# Patient Record
Sex: Male | Born: 1965
Health system: Southern US, Community
[De-identification: ages and names within clinical notes are randomized; demographics above are authoritative.]

## PROBLEM LIST (undated history)

## (undated) DIAGNOSIS — I10 Essential (primary) hypertension: Secondary | ICD-10-CM

## (undated) DIAGNOSIS — N189 Chronic kidney disease, unspecified: Secondary | ICD-10-CM

## (undated) DIAGNOSIS — E785 Hyperlipidemia, unspecified: Secondary | ICD-10-CM

## (undated) DIAGNOSIS — Z992 Dependence on renal dialysis: Secondary | ICD-10-CM

## (undated) DIAGNOSIS — D649 Anemia, unspecified: Secondary | ICD-10-CM

## (undated) DIAGNOSIS — F419 Anxiety disorder, unspecified: Secondary | ICD-10-CM

## (undated) DIAGNOSIS — M2550 Pain in unspecified joint: Secondary | ICD-10-CM

## (undated) HISTORY — PX: KIDNEY TRANSPLANT: SHX239

## (undated) HISTORY — DX: Chronic kidney disease, unspecified: N18.9

## (undated) HISTORY — DX: Pain in unspecified joint: M25.50

## (undated) HISTORY — DX: Hyperlipidemia, unspecified: E78.5

## (undated) HISTORY — PX: HAND SURGERY: SHX662

## (undated) HISTORY — PX: BACK SURGERY: SHX140

## (undated) HISTORY — DX: Anemia, unspecified: D64.9

## (undated) HISTORY — DX: Anxiety disorder, unspecified: F41.9

## (undated) HISTORY — DX: Essential (primary) hypertension: I10

## (undated) HISTORY — PX: OPEN ANTERIOR SHOULDER RECONSTRUCTION: SHX2100

---

## 2002-07-02 ENCOUNTER — Encounter: Payer: Self-pay | Admitting: Emergency Medicine

## 2002-07-02 ENCOUNTER — Emergency Department (HOSPITAL_COMMUNITY): Admission: EM | Admit: 2002-07-02 | Discharge: 2002-07-02 | Payer: Self-pay | Admitting: Emergency Medicine

## 2002-07-03 ENCOUNTER — Inpatient Hospital Stay (HOSPITAL_COMMUNITY): Admission: EM | Admit: 2002-07-03 | Discharge: 2002-07-04 | Payer: Self-pay | Admitting: Emergency Medicine

## 2002-07-03 ENCOUNTER — Encounter: Payer: Self-pay | Admitting: Urology

## 2002-10-19 ENCOUNTER — Other Ambulatory Visit (HOSPITAL_COMMUNITY): Admission: RE | Admit: 2002-10-19 | Discharge: 2002-11-26 | Payer: Self-pay | Admitting: *Deleted

## 2002-12-04 ENCOUNTER — Other Ambulatory Visit (HOSPITAL_COMMUNITY): Admission: RE | Admit: 2002-12-04 | Discharge: 2002-12-27 | Payer: Self-pay | Admitting: *Deleted

## 2006-03-17 ENCOUNTER — Emergency Department (HOSPITAL_COMMUNITY): Admission: EM | Admit: 2006-03-17 | Discharge: 2006-03-17 | Payer: Self-pay | Admitting: Emergency Medicine

## 2006-03-29 ENCOUNTER — Encounter: Admission: RE | Admit: 2006-03-29 | Discharge: 2006-03-29 | Payer: Self-pay | Admitting: Orthopedic Surgery

## 2006-04-04 ENCOUNTER — Ambulatory Visit (HOSPITAL_COMMUNITY): Admission: RE | Admit: 2006-04-04 | Discharge: 2006-04-05 | Payer: Self-pay | Admitting: Orthopedic Surgery

## 2006-05-04 ENCOUNTER — Encounter: Admission: RE | Admit: 2006-05-04 | Discharge: 2006-05-04 | Payer: Self-pay | Admitting: Orthopedic Surgery

## 2006-05-25 ENCOUNTER — Encounter: Admission: RE | Admit: 2006-05-25 | Discharge: 2006-05-25 | Payer: Self-pay | Admitting: Orthopedic Surgery

## 2006-07-07 ENCOUNTER — Ambulatory Visit (HOSPITAL_COMMUNITY): Admission: RE | Admit: 2006-07-07 | Discharge: 2006-07-07 | Payer: Self-pay | Admitting: Orthopedic Surgery

## 2007-02-02 IMAGING — CR DG CHEST 2V
2 series · 2 of 2 positions shown · non-contrast
Comparison: None.

CLINICAL DATA: Preadmission for left clavicle fracture.  
 CHEST ? 2 VIEW:

[view not recorded (1 of 2)]
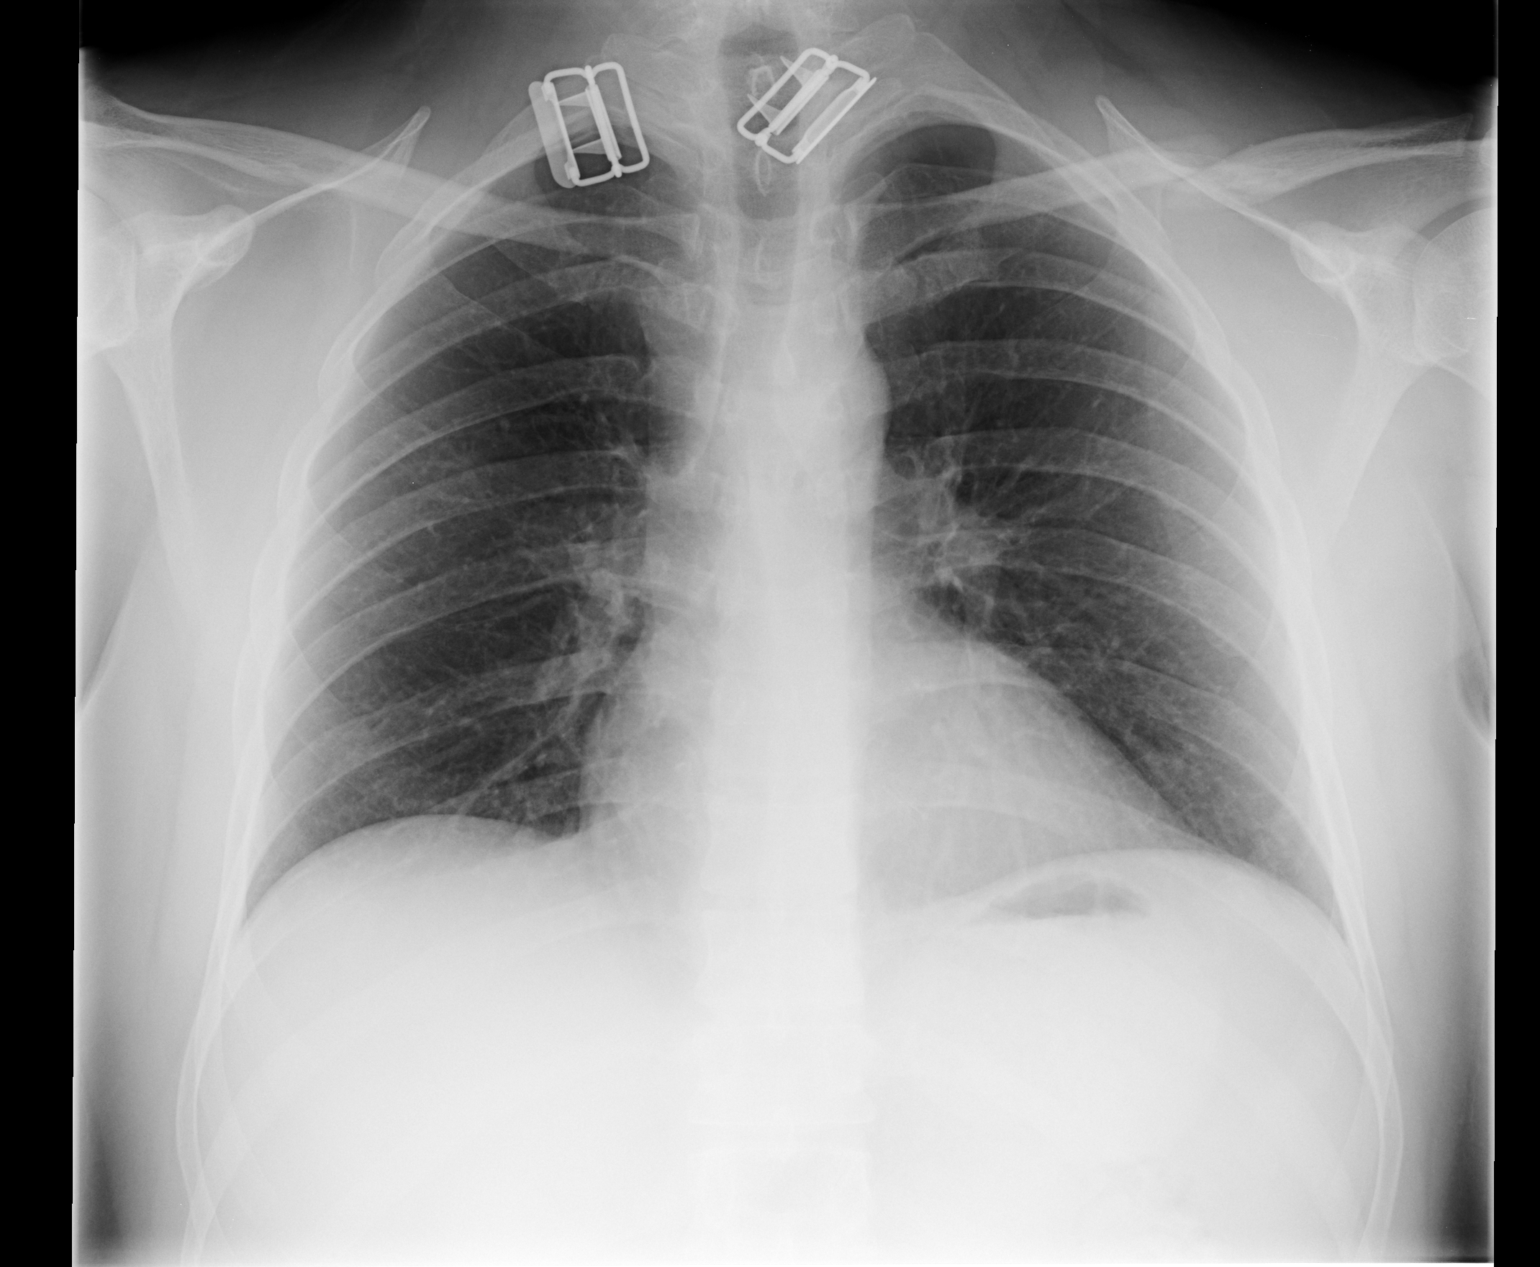

[view not recorded (2 of 2)]
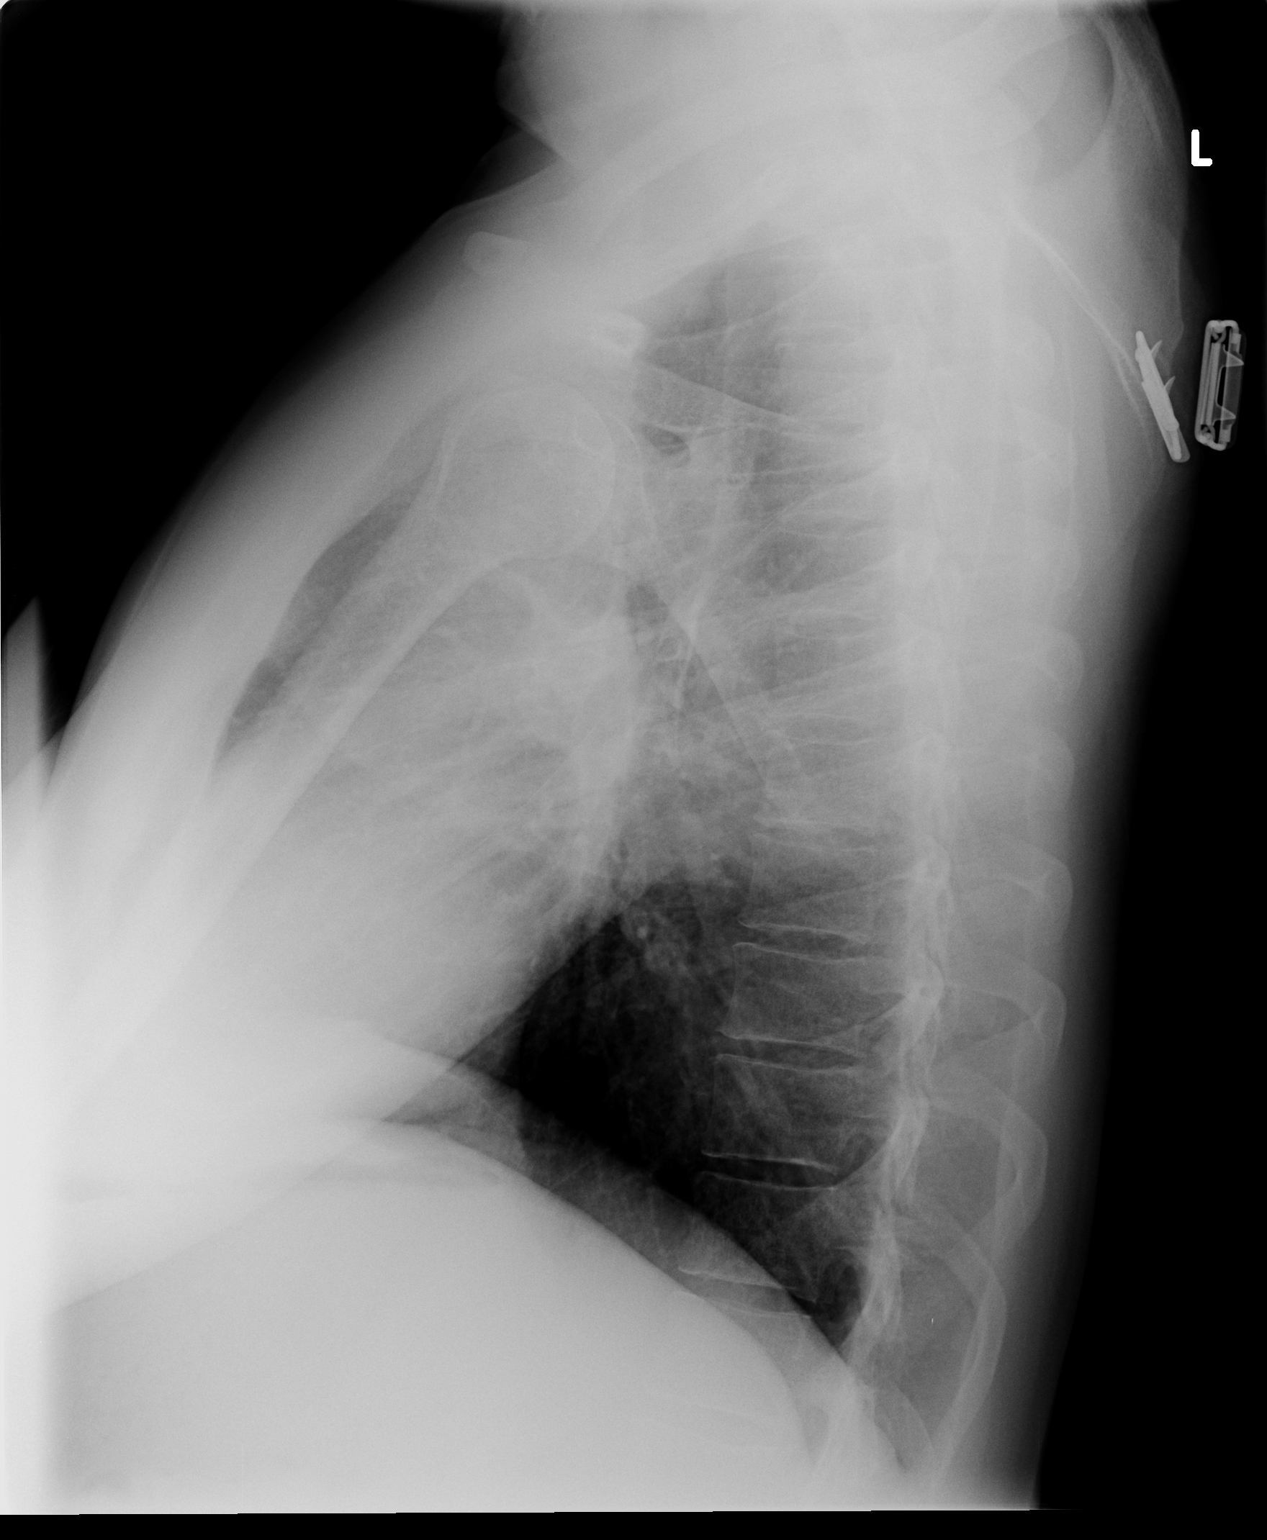

[2 of 2 positions shown; findings below may reference images not displayed]

FINDINGS: Heart size is accentuated by low lung volumes.  Right base subsegmental atelectasis.  Lungs are otherwise clear.  Left clavicle fracture noted.
IMPRESSION: 1.  No acute cardiopulmonary process.
 2.  Left clavicle fracture.

## 2007-02-05 IMAGING — RF DG CLAVICLE*L*
1 series · 4 of 4 positions shown · non-contrast
Comparison: none

CLINICAL DATA: Left clavicle fracture.  ORIF.
 LEFT CLAVICLE ? 4 VIEW:

[Series 1: run · 4 of 4 slices shown]
[im 1/4]
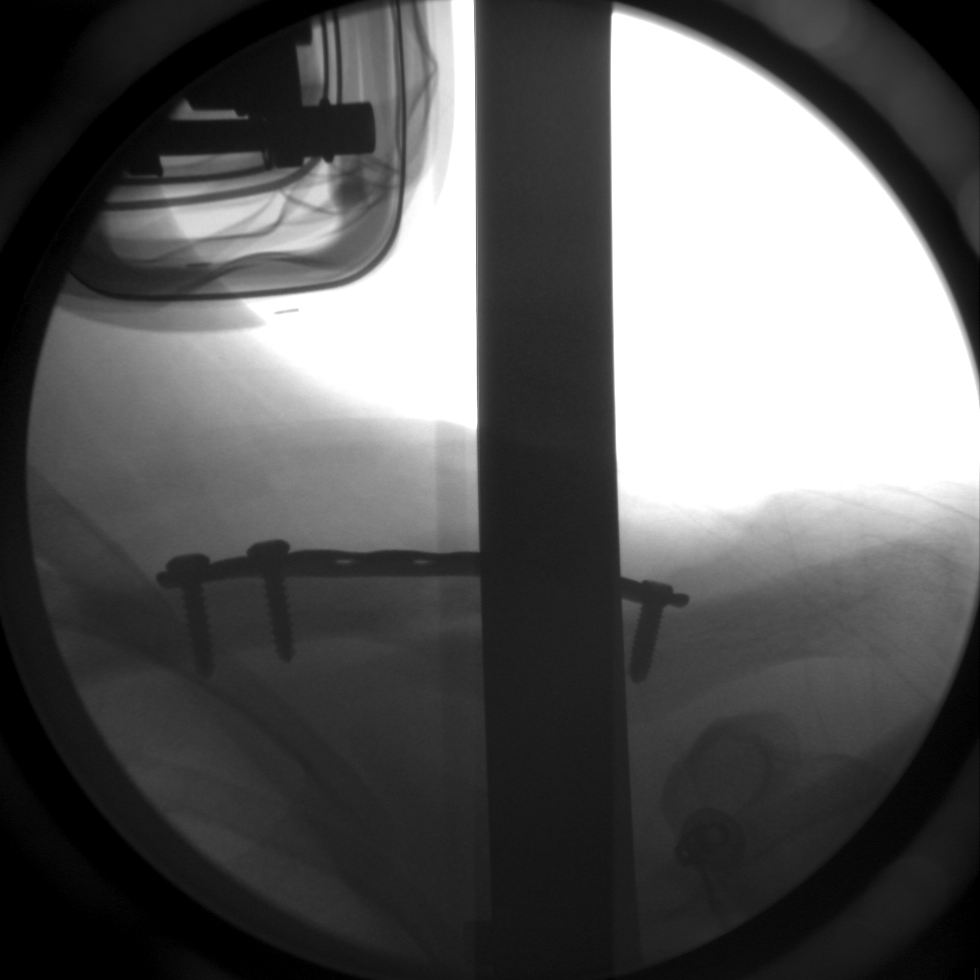
[im 2/4]
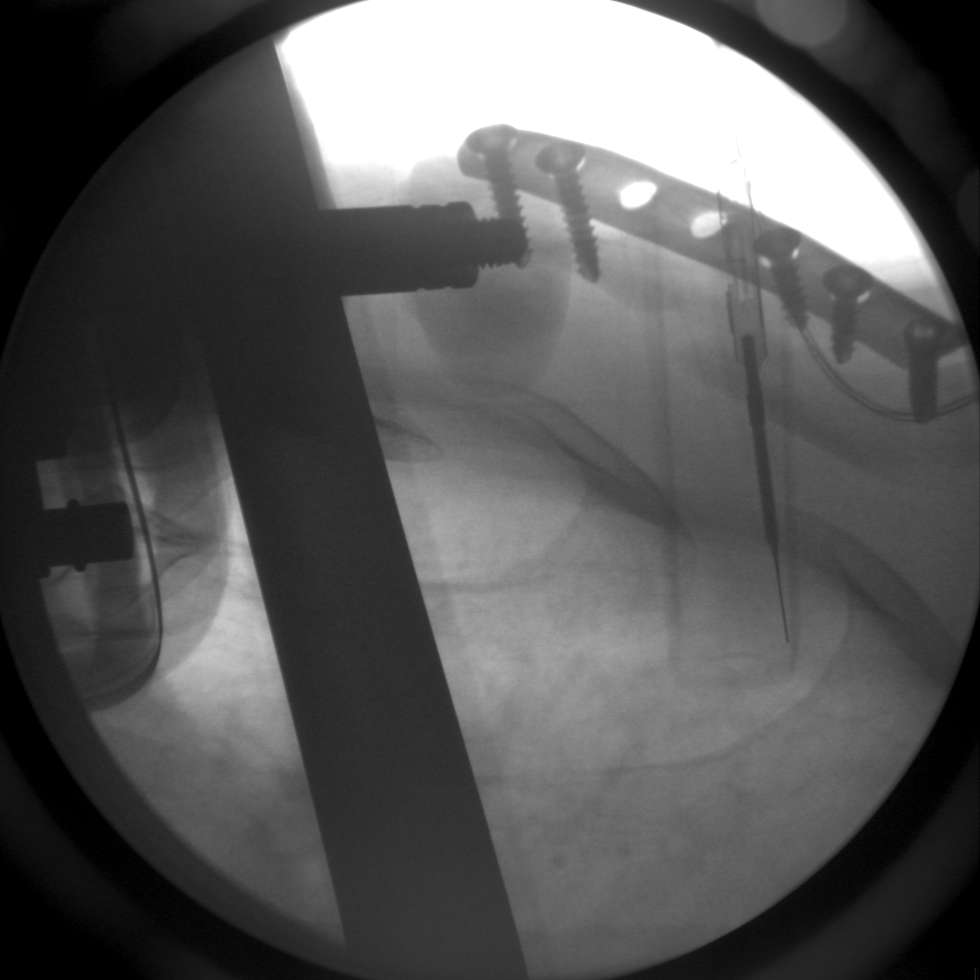
[im 3/4]
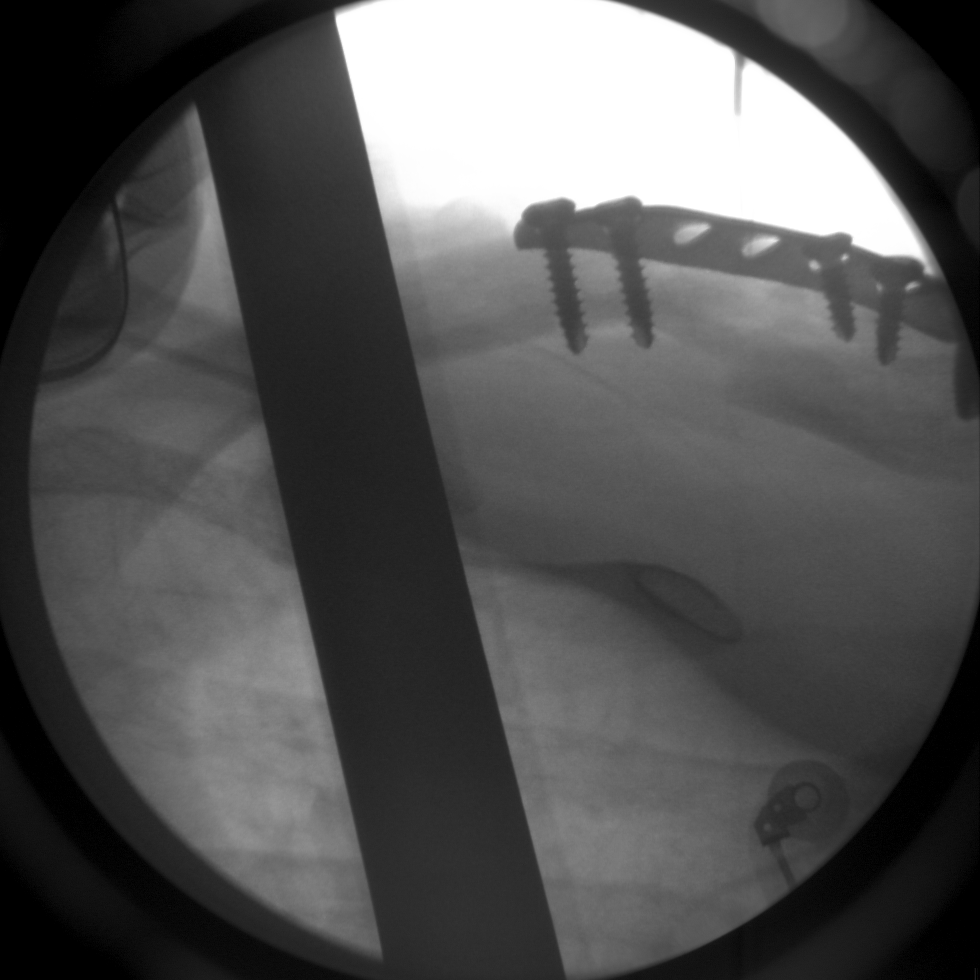
[im 4/4]
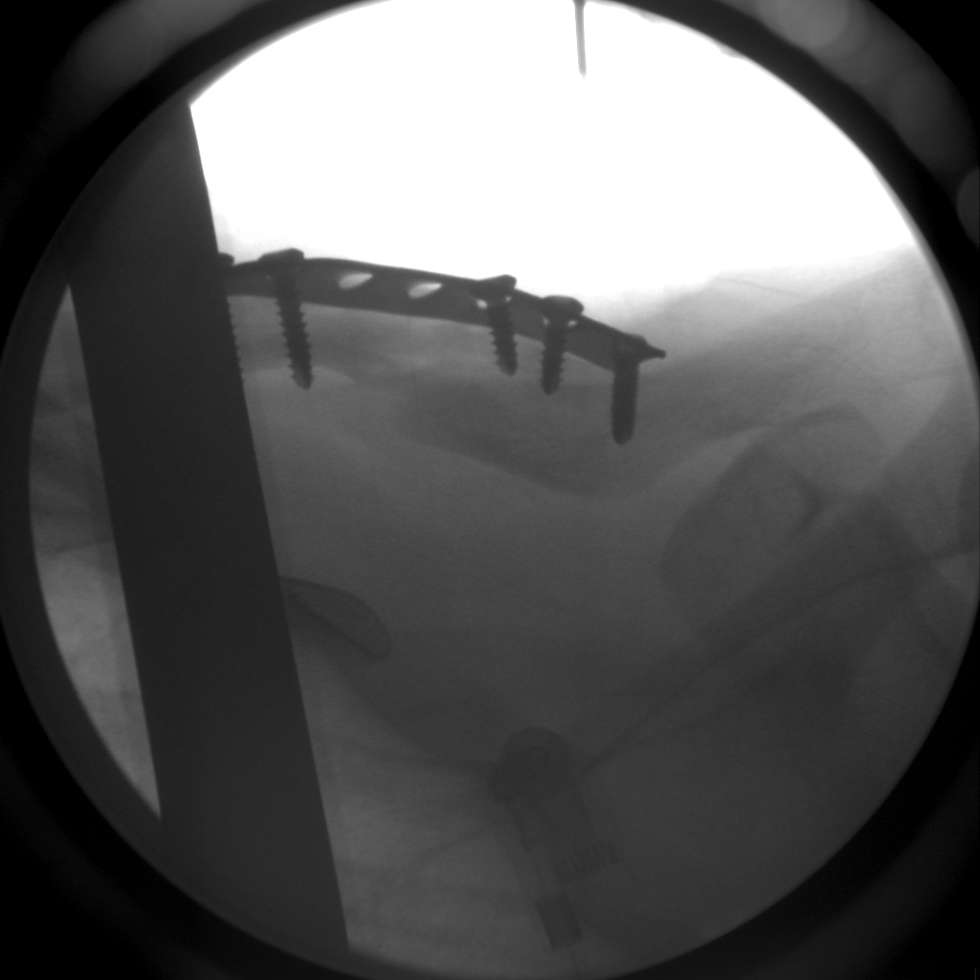

[4 of 4 positions shown; findings below may reference images not displayed]

FINDINGS: Four C-arm guided fluoroscopic images were acquired for assistance with ORIF of displaced left clavicular fracture.  Plate and screw fixation hardware has been placed.
IMPRESSION: Status post ORIF left clavicle fracture.

## 2007-04-28 ENCOUNTER — Emergency Department (HOSPITAL_COMMUNITY): Admission: EM | Admit: 2007-04-28 | Discharge: 2007-04-28 | Payer: Self-pay | Admitting: Emergency Medicine

## 2007-05-03 ENCOUNTER — Emergency Department (HOSPITAL_COMMUNITY): Admission: EM | Admit: 2007-05-03 | Discharge: 2007-05-03 | Payer: Self-pay | Admitting: Family Medicine

## 2009-12-05 ENCOUNTER — Inpatient Hospital Stay (HOSPITAL_COMMUNITY): Admission: EM | Admit: 2009-12-05 | Discharge: 2009-12-17 | Payer: Self-pay | Admitting: Emergency Medicine

## 2009-12-05 ENCOUNTER — Ambulatory Visit: Payer: Self-pay | Admitting: Cardiovascular Disease

## 2009-12-06 ENCOUNTER — Encounter (INDEPENDENT_AMBULATORY_CARE_PROVIDER_SITE_OTHER): Payer: Self-pay | Admitting: Internal Medicine

## 2009-12-08 ENCOUNTER — Encounter (INDEPENDENT_AMBULATORY_CARE_PROVIDER_SITE_OTHER): Payer: Self-pay | Admitting: Internal Medicine

## 2009-12-13 ENCOUNTER — Other Ambulatory Visit: Payer: Self-pay | Admitting: Vascular Surgery

## 2009-12-13 ENCOUNTER — Ambulatory Visit: Payer: Self-pay | Admitting: Vascular Surgery

## 2010-01-06 ENCOUNTER — Ambulatory Visit: Payer: Self-pay | Admitting: Vascular Surgery

## 2010-01-15 ENCOUNTER — Ambulatory Visit (HOSPITAL_COMMUNITY): Admission: RE | Admit: 2010-01-15 | Discharge: 2010-01-15 | Payer: Self-pay | Admitting: Vascular Surgery

## 2010-01-15 ENCOUNTER — Ambulatory Visit: Payer: Self-pay | Admitting: Vascular Surgery

## 2010-01-27 ENCOUNTER — Ambulatory Visit: Payer: Self-pay | Admitting: Vascular Surgery

## 2010-01-29 ENCOUNTER — Ambulatory Visit (HOSPITAL_COMMUNITY): Admission: RE | Admit: 2010-01-29 | Discharge: 2010-01-29 | Payer: Self-pay | Admitting: Vascular Surgery

## 2010-03-10 ENCOUNTER — Ambulatory Visit: Payer: Self-pay | Admitting: Vascular Surgery

## 2010-05-21 ENCOUNTER — Ambulatory Visit (HOSPITAL_COMMUNITY): Admission: RE | Admit: 2010-05-21 | Discharge: 2010-05-21 | Payer: Self-pay | Admitting: Vascular Surgery

## 2010-05-21 ENCOUNTER — Ambulatory Visit: Payer: Self-pay | Admitting: Vascular Surgery

## 2010-10-25 ENCOUNTER — Encounter: Payer: Self-pay | Admitting: Nephrology

## 2010-11-24 ENCOUNTER — Other Ambulatory Visit (HOSPITAL_BASED_OUTPATIENT_CLINIC_OR_DEPARTMENT_OTHER): Payer: Self-pay | Admitting: Family Medicine

## 2010-11-24 DIAGNOSIS — M545 Low back pain, unspecified: Secondary | ICD-10-CM

## 2010-11-25 ENCOUNTER — Other Ambulatory Visit (HOSPITAL_BASED_OUTPATIENT_CLINIC_OR_DEPARTMENT_OTHER): Payer: Self-pay

## 2010-11-28 ENCOUNTER — Other Ambulatory Visit (HOSPITAL_BASED_OUTPATIENT_CLINIC_OR_DEPARTMENT_OTHER): Payer: Self-pay

## 2010-12-03 ENCOUNTER — Ambulatory Visit (INDEPENDENT_AMBULATORY_CARE_PROVIDER_SITE_OTHER): Payer: Medicare Other

## 2010-12-03 DIAGNOSIS — N186 End stage renal disease: Secondary | ICD-10-CM

## 2010-12-03 NOTE — H&P (Signed)
HISTORY AND PHYSICAL EXAMINATION  December 03, 2010  Re:  Billy [name], Billy [name]               DOB:  1965-11-24  HISTORY OF PRESENT ILLNESS:  The patient is a 45 year old Caucasian male who presents today for evaluation of pseudoaneurysm over his left brachiocephalic AV fistula that was placed in 2011 by Dr. Kellie Simmering.  The patient states that over the past 2 months, he has noticed a significant increase in size of pseudoaneurysm at the distal portion of the fistula, and it is now difficult for him to bend his arm and sometimes is uncomfortable/painful.  The patient states that he first started noticing increase in the size of the fistula probably 6 or 8 months ago, and then it has become significantly larger over the past 2 months.  The patient denies bleeding issues with the fistula, and he denies steal symptoms.  He wished to be evaluated secondary to the increase in size and the discomfort that he notices, particularly when he bends his arm.  PAST MEDICAL HISTORY: 1. End-stage renal disease, dialysis, Monday, Wednesday, Friday. 2. Hypertension. 3. Hyperlipidemia. 4. Shoulder surgery x3 in 2007. 5. Right hand surgery many years ago.  FAMILY HISTORY:  Not available.  The patient is adopted.  SOCIAL HISTORY:  The patient is single with 3 children.  He is on disability.  He denies tobacco use and drinks alcohol on occasion.  Complete review of systems is negative.  Please refer to HPI otherwise.  MEDICATIONS: 1. Lisinopril. 2. Poly Iron. 3. Carvedilol. 4. Pravastatin.  ALLERGIES:  Calcium channel blockers.  PHYSICAL EXAM:  Patient is in no acute distress.  He is resting comfortable on the bed.  HEENT: PERRL.  EOMI.  Lungs: Clear to auscultation.  Cardiovascular: Regular rate and rhythm.  Abdomen is soft with active bowel sounds.  Musculoskeletal: No major deformities or cyanosis.  There is a left brachial cephalic fistula present with a thrill and bruit.  There is  a moderate-sized pseudoaneurysm present from the antecubital space up to the mid upper arm.  There is no evidence of ulceration or skin breakdown in this area.  The patient has a 2+ palpable radial and ulnar pulse.  Motor and sensation are intact. Neuro: No focal weakness or paresthesias.  Skin:  No ulcers or rashes.  ASSESSMENT: 1. End-stage renal disease with hemodialysis on Monday, Wednesday, and     Friday. 2. 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  PLAN:  The patient does not want to have a Diatek catheter in place. This case was discussed with Dr. Oneida Alar, and he recommended that the patient return in approximately 1 month for a follow-up with Dr. Kellie Simmering after having a repeat right upper extremity vein mapping in order to consider placement of a right arm AV fistula, so that this can be maturing and developing when the time comes, that the left upper arm AV fistula is no longer usable.  The patient is made aware that if he starts to notice any thinning of the skin or progressive dilatation of the pseudoaneurysm, that he should call us immediately, and other plans will be made.  The patient is happy with this plan, and he will follow up with Dr. Kellie Simmering in 1 month to review vein mapping and discuss right upper extremity fistula placement.  Leta Baptist, PA  Charles E. Fields, MD Electronically Signed  AY/MEDQ  D:  12/03/2010  T:  12/03/2010  Job:  [id number]

## 2010-12-05 ENCOUNTER — Ambulatory Visit (HOSPITAL_BASED_OUTPATIENT_CLINIC_OR_DEPARTMENT_OTHER): Admission: RE | Admit: 2010-12-05 | Payer: Medicare Other | Source: Ambulatory Visit

## 2010-12-09 ENCOUNTER — Other Ambulatory Visit (HOSPITAL_BASED_OUTPATIENT_CLINIC_OR_DEPARTMENT_OTHER): Payer: Self-pay | Admitting: Family Medicine

## 2010-12-09 DIAGNOSIS — M545 Low back pain, unspecified: Secondary | ICD-10-CM

## 2010-12-12 ENCOUNTER — Ambulatory Visit (HOSPITAL_BASED_OUTPATIENT_CLINIC_OR_DEPARTMENT_OTHER)
Admission: RE | Admit: 2010-12-12 | Discharge: 2010-12-12 | Disposition: A | Discharge status: Home / Self Care | Payer: Medicare Other | Source: Ambulatory Visit | Attending: Family Medicine | Admitting: Family Medicine

## 2010-12-12 DIAGNOSIS — M5137 Other intervertebral disc degeneration, lumbosacral region: Secondary | ICD-10-CM | POA: Insufficient documentation

## 2010-12-12 DIAGNOSIS — M545 Low back pain, unspecified: Secondary | ICD-10-CM

## 2010-12-12 DIAGNOSIS — M51379 Other intervertebral disc degeneration, lumbosacral region without mention of lumbar back pain or lower extremity pain: Secondary | ICD-10-CM | POA: Insufficient documentation

## 2010-12-12 DIAGNOSIS — M47817 Spondylosis without myelopathy or radiculopathy, lumbosacral region: Secondary | ICD-10-CM | POA: Insufficient documentation

## 2010-12-15 ENCOUNTER — Encounter (INDEPENDENT_AMBULATORY_CARE_PROVIDER_SITE_OTHER): Payer: Medicare Other

## 2010-12-15 ENCOUNTER — Ambulatory Visit (INDEPENDENT_AMBULATORY_CARE_PROVIDER_SITE_OTHER): Payer: Medicare Other | Admitting: Vascular Surgery

## 2010-12-15 DIAGNOSIS — N186 End stage renal disease: Secondary | ICD-10-CM

## 2010-12-15 DIAGNOSIS — Z01810 Encounter for preprocedural cardiovascular examination: Secondary | ICD-10-CM

## 2010-12-15 NOTE — Assessment & Plan Note (Signed)
OFFICE VISIT  YEHYA, ZILE DOB:  Oct 07, 1965                                       12/15/2010 [id number]  Patient returns today for further discussion regarding new access.  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  This has been diffusely dilating over time since it was inserted last April 2011.  It continues to have excellent flow with no steal symptoms.  He previously had a failed left forearm fistula.  He is right-handed.  PHYSICAL EXAMINATION:  On exam today, blood pressure is 150/99, heart rate is 100, respirations 14, temperature 98.  Right upper arm has a 3+ brachial and radial pulse.  I visualized the veins independently with a SonoSite today, and he does have a patent right upper arm cephalic vein of borderline size.  He had formal vein mapping performed, which revealed similar findings with a patent right upper arm cephalic vein, borderline in size.  He also has a patent right upper arm basilic vein.  I think the plan should be to the attempt creation of a right upper arm brachiocephalic fistula, and if this is not adequate or the vein is found to be very small at the time of surgery, then we would proceed with basilic vein transposition.  He understands this and would like to proceed next week.  We have scheduled this for March 22nd at [facility].  The left arm fistula can then be utilized while the right arm fistula is maturing over the next 3 months.    Nelda Severe Kellie Simmering, M.D. Electronically Signed  JDL/MEDQ  D:  12/15/2010  T:  12/15/2010  Job:  [id number]

## 2010-12-21 NOTE — Procedures (Unsigned)
CEPHALIC VEIN MAPPING  INDICATION:  Preoperative vein mapping.  HISTORY:  EXAM:  The right cephalic vein is compressible with diameter measurements ranging from 0.14 to 0.34 cm.  The right basilic vein is compressible with diameter measurements ranging from 0.19 to 0.45 cm.  The right median cubital vein appears to extend from the antecubital fossa to the wrist with diameter measurements ranging from 0.25 to 0.4 cm.  IMPRESSION:  Patent right cephalic and basilic veins with diameter measurements as described above.  ___________________________________________ Billy [name]. Kellie Simmering, M.D.  CH/MEDQ  D:  12/15/2010  T:  12/15/2010  Job:  [id number]

## 2010-12-23 LAB — POCT I-STAT 4, (NA,K, GLUC, HGB,HCT): HCT: 41 % (ref 39.0–52.0)

## 2010-12-24 ENCOUNTER — Ambulatory Visit (HOSPITAL_COMMUNITY)
Admission: RE | Admit: 2010-12-24 | Discharge: 2010-12-24 | Disposition: A | Discharge status: Home / Self Care | Payer: Medicare Other | Source: Ambulatory Visit | Attending: Vascular Surgery | Admitting: Vascular Surgery

## 2010-12-24 ENCOUNTER — Ambulatory Visit (HOSPITAL_COMMUNITY): Payer: Medicare Other

## 2010-12-24 DIAGNOSIS — I120 Hypertensive chronic kidney disease with stage 5 chronic kidney disease or end stage renal disease: Secondary | ICD-10-CM | POA: Insufficient documentation

## 2010-12-24 DIAGNOSIS — N186 End stage renal disease: Secondary | ICD-10-CM | POA: Insufficient documentation

## 2010-12-24 HISTORY — PX: AV FISTULA PLACEMENT: SHX1204

## 2010-12-24 LAB — SURGICAL PCR SCREEN: MRSA, PCR: NEGATIVE

## 2010-12-25 LAB — POCT I-STAT 4, (NA,K, GLUC, HGB,HCT)
HCT: 34 % — ABNORMAL LOW (ref 39.0–52.0)
Hemoglobin: 11.6 g/dL — ABNORMAL LOW (ref 13.0–17.0)
Potassium: 4.3 mEq/L (ref 3.5–5.1)

## 2010-12-27 LAB — POCT I-STAT, CHEM 8
Creatinine, Ser: 8.5 mg/dL — ABNORMAL HIGH (ref 0.4–1.5)
Glucose, Bld: 101 mg/dL — ABNORMAL HIGH (ref 70–99)
Hemoglobin: 8.5 g/dL — ABNORMAL LOW (ref 13.0–17.0)
Potassium: 3.6 mEq/L (ref 3.5–5.1)
Sodium: 139 mEq/L (ref 135–145)

## 2010-12-28 LAB — DIFFERENTIAL
Basophils Absolute: 0 10*3/uL (ref 0.0–0.1)
Basophils Absolute: 0 10*3/uL (ref 0.0–0.1)
Basophils Absolute: 0 10*3/uL (ref 0.0–0.1)
Basophils Absolute: 0.1 10*3/uL (ref 0.0–0.1)
Basophils Absolute: 0.1 K/uL (ref 0.0–0.1)
Basophils Relative: 0 % (ref 0–1)
Basophils Relative: 1 % (ref 0–1)
Basophils Relative: 1 % (ref 0–1)
Basophils Relative: 1 % (ref 0–1)
Eosinophils Absolute: 0.4 10*3/uL (ref 0.0–0.7)
Eosinophils Absolute: 0.4 10*3/uL (ref 0.0–0.7)
Eosinophils Absolute: 0.4 10*3/uL (ref 0.0–0.7)
Eosinophils Absolute: 0.4 10*3/uL (ref 0.0–0.7)
Eosinophils Relative: 4 % (ref 0–5)
Eosinophils Relative: 4 % (ref 0–5)
Eosinophils Relative: 4 % (ref 0–5)
Eosinophils Relative: 4 % (ref 0–5)
Eosinophils Relative: 4 % (ref 0–5)
Eosinophils Relative: 4 % (ref 0–5)
Lymphocytes Relative: 14 % (ref 12–46)
Lymphocytes Relative: 15 % (ref 12–46)
Lymphocytes Relative: 17 % (ref 12–46)
Lymphocytes Relative: 17 % (ref 12–46)
Lymphocytes Relative: 20 % (ref 12–46)
Lymphs Abs: 1.3 10*3/uL (ref 0.7–4.0)
Lymphs Abs: 1.6 10*3/uL (ref 0.7–4.0)
Lymphs Abs: 1.8 10*3/uL (ref 0.7–4.0)
Lymphs Abs: 2 10*3/uL (ref 0.7–4.0)
Lymphs Abs: 2 10*3/uL (ref 0.7–4.0)
Lymphs Abs: 2.2 10*3/uL (ref 0.7–4.0)
Monocytes Absolute: 0.6 10*3/uL (ref 0.1–1.0)
Monocytes Absolute: 0.6 10*3/uL (ref 0.1–1.0)
Monocytes Absolute: 0.7 10*3/uL (ref 0.1–1.0)
Monocytes Absolute: 0.8 K/uL (ref 0.1–1.0)
Monocytes Absolute: 0.9 10*3/uL (ref 0.1–1.0)
Monocytes Absolute: 1.2 10*3/uL — ABNORMAL HIGH (ref 0.1–1.0)
Monocytes Relative: 5 % (ref 3–12)
Monocytes Relative: 6 % (ref 3–12)
Monocytes Relative: 6 % (ref 3–12)
Monocytes Relative: 8 % (ref 3–12)
Monocytes Relative: 9 % (ref 3–12)
Neutro Abs: 7.1 10*3/uL (ref 1.7–7.7)
Neutro Abs: 7.1 K/uL (ref 1.7–7.7)
Neutro Abs: 7.5 10*3/uL (ref 1.7–7.7)
Neutro Abs: 8.2 10*3/uL — ABNORMAL HIGH (ref 1.7–7.7)
Neutro Abs: 8.3 10*3/uL — ABNORMAL HIGH (ref 1.7–7.7)
Neutrophils Relative %: 66 % (ref 43–77)
Neutrophils Relative %: 70 % (ref 43–77)
Neutrophils Relative %: 73 % (ref 43–77)
Neutrophils Relative %: 75 % (ref 43–77)
Neutrophils Relative %: 80 % — ABNORMAL HIGH (ref 43–77)

## 2010-12-28 LAB — BASIC METABOLIC PANEL
BUN: 51 mg/dL — ABNORMAL HIGH (ref 6–23)
BUN: 72 mg/dL — ABNORMAL HIGH (ref 6–23)
CO2: 23 mEq/L (ref 19–32)
CO2: 23 mEq/L (ref 19–32)
CO2: 25 mEq/L (ref 19–32)
CO2: 26 mEq/L (ref 19–32)
Calcium: 8.5 mg/dL (ref 8.4–10.5)
Calcium: 9.3 mg/dL (ref 8.4–10.5)
Chloride: 100 mEq/L (ref 96–112)
Chloride: 105 mEq/L (ref 96–112)
Creatinine, Ser: 7.5 mg/dL — ABNORMAL HIGH (ref 0.4–1.5)
Creatinine, Ser: 8.33 mg/dL — ABNORMAL HIGH (ref 0.4–1.5)
Creatinine, Ser: 8.62 mg/dL — ABNORMAL HIGH (ref 0.4–1.5)
GFR calc Af Amer: 9 mL/min — ABNORMAL LOW (ref >60)
GFR calc Af Amer: 9 mL/min — ABNORMAL LOW (ref >60)
GFR calc non Af Amer: 6 mL/min — ABNORMAL LOW (ref >60)
GFR calc non Af Amer: 7 mL/min — ABNORMAL LOW (ref >60)
GFR calc non Af Amer: 7 mL/min — ABNORMAL LOW (ref >60)
GFR calc non Af Amer: 8 mL/min — ABNORMAL LOW (ref >60)
Glucose, Bld: 100 mg/dL — ABNORMAL HIGH (ref 70–99)
Glucose, Bld: 100 mg/dL — ABNORMAL HIGH (ref 70–99)
Glucose, Bld: 110 mg/dL — ABNORMAL HIGH (ref 70–99)
Glucose, Bld: 98 mg/dL (ref 70–99)
Potassium: 3.3 mEq/L — ABNORMAL LOW (ref 3.5–5.1)
Potassium: 3.4 mEq/L — ABNORMAL LOW (ref 3.5–5.1)
Potassium: 3.7 mEq/L (ref 3.5–5.1)
Potassium: 3.9 mEq/L (ref 3.5–5.1)
Potassium: 4.6 mEq/L (ref 3.5–5.1)
Sodium: 136 mEq/L (ref 135–145)
Sodium: 136 mEq/L (ref 135–145)
Sodium: 137 mEq/L (ref 135–145)
Sodium: 139 mEq/L (ref 135–145)

## 2010-12-28 LAB — URINALYSIS, ROUTINE W REFLEX MICROSCOPIC
Bilirubin Urine: NEGATIVE
Glucose, UA: 100 mg/dL — AB
Ketones, ur: NEGATIVE mg/dL
Leukocytes, UA: NEGATIVE
Nitrite: NEGATIVE
Protein, ur: 100 mg/dL — AB
Specific Gravity, Urine: 1.015 (ref 1.005–1.030)
Urobilinogen, UA: 0.2 mg/dL (ref 0.0–1.0)
pH: 6 (ref 5.0–8.0)

## 2010-12-28 LAB — UIFE/LIGHT CHAINS/TP QN, 24-HR UR
Beta, Urine: DETECTED — AB
Free Lambda Excretion/Day: 70.84 mg/d
Free Lambda Lt Chains,Ur: 1.84 mg/dL — ABNORMAL HIGH (ref 0.08–1.01)
Free Lt Chn Excr Rate: 383.46 mg/d
Gamma Globulin, Urine: DETECTED — AB
Total Protein, Urine: 76.5 mg/dL

## 2010-12-28 LAB — CBC
HCT: 26.1 % — ABNORMAL LOW (ref 39.0–52.0)
HCT: 28.3 % — ABNORMAL LOW (ref 39.0–52.0)
HCT: 28.6 % — ABNORMAL LOW (ref 39.0–52.0)
HCT: 32.7 % — ABNORMAL LOW (ref 39.0–52.0)
HCT: 35.6 % — ABNORMAL LOW (ref 39.0–52.0)
Hemoglobin: 11.6 g/dL — ABNORMAL LOW (ref 13.0–17.0)
Hemoglobin: 12.6 g/dL — ABNORMAL LOW (ref 13.0–17.0)
Hemoglobin: 9.4 g/dL — ABNORMAL LOW (ref 13.0–17.0)
Hemoglobin: 9.6 g/dL — ABNORMAL LOW (ref 13.0–17.0)
Hemoglobin: 9.6 g/dL — ABNORMAL LOW (ref 13.0–17.0)
Hemoglobin: 9.9 g/dL — ABNORMAL LOW (ref 13.0–17.0)
MCHC: 33.8 g/dL (ref 30.0–36.0)
MCHC: 34.9 g/dL (ref 30.0–36.0)
MCHC: 35.2 g/dL (ref 30.0–36.0)
MCHC: 35.5 g/dL (ref 30.0–36.0)
MCHC: 36.2 g/dL — ABNORMAL HIGH (ref 30.0–36.0)
MCV: 86.9 fL (ref 78.0–100.0)
MCV: 88.2 fL (ref 78.0–100.0)
MCV: 89.5 fL (ref 78.0–100.0)
Platelets: 147 10*3/uL — ABNORMAL LOW (ref 150–400)
Platelets: 165 K/uL (ref 150–400)
Platelets: 180 10*3/uL (ref 150–400)
Platelets: 189 10*3/uL (ref 150–400)
Platelets: 199 10*3/uL (ref 150–400)
Platelets: 224 10*3/uL (ref 150–400)
RBC: 3.25 MIL/uL — ABNORMAL LOW (ref 4.22–5.81)
RBC: 3.66 MIL/uL — ABNORMAL LOW (ref 4.22–5.81)
RBC: 4.04 MIL/uL — ABNORMAL LOW (ref 4.22–5.81)
RDW: 12.4 % (ref 11.5–15.5)
RDW: 12.6 % (ref 11.5–15.5)
RDW: 12.9 % (ref 11.5–15.5)
RDW: 12.9 % (ref 11.5–15.5)
RDW: 13 % (ref 11.5–15.5)
RDW: 13 % (ref 11.5–15.5)
WBC: 10.2 K/uL (ref 4.0–10.5)
WBC: 11.2 10*3/uL — ABNORMAL HIGH (ref 4.0–10.5)
WBC: 11.4 10*3/uL — ABNORMAL HIGH (ref 4.0–10.5)
WBC: 12 10*3/uL — ABNORMAL HIGH (ref 4.0–10.5)

## 2010-12-28 LAB — RENAL FUNCTION PANEL
Albumin: 3.2 g/dL — ABNORMAL LOW (ref 3.5–5.2)
Albumin: 3.3 g/dL — ABNORMAL LOW (ref 3.5–5.2)
Albumin: 3.4 g/dL — ABNORMAL LOW (ref 3.5–5.2)
Albumin: 3.5 g/dL (ref 3.5–5.2)
BUN: 50 mg/dL — ABNORMAL HIGH (ref 6–23)
BUN: 55 mg/dL — ABNORMAL HIGH (ref 6–23)
BUN: 64 mg/dL — ABNORMAL HIGH (ref 6–23)
BUN: 70 mg/dL — ABNORMAL HIGH (ref 6–23)
CO2: 22 mEq/L (ref 19–32)
CO2: 24 mEq/L (ref 19–32)
Calcium: 9.1 mg/dL (ref 8.4–10.5)
Calcium: 9.3 mg/dL (ref 8.4–10.5)
Chloride: 102 mEq/L (ref 96–112)
Chloride: 103 mEq/L (ref 96–112)
Chloride: 104 mEq/L (ref 96–112)
Chloride: 107 mEq/L (ref 96–112)
Creatinine, Ser: 8.41 mg/dL — ABNORMAL HIGH (ref 0.4–1.5)
GFR calc Af Amer: 10 mL/min — ABNORMAL LOW (ref >60)
GFR calc Af Amer: 9 mL/min — ABNORMAL LOW (ref >60)
GFR calc non Af Amer: 7 mL/min — ABNORMAL LOW (ref >60)
GFR calc non Af Amer: 7 mL/min — ABNORMAL LOW (ref >60)
GFR calc non Af Amer: 8 mL/min — ABNORMAL LOW (ref >60)
GFR calc non Af Amer: 8 mL/min — ABNORMAL LOW (ref >60)
Phosphorus: 5.3 mg/dL — ABNORMAL HIGH (ref 2.3–4.6)
Phosphorus: 5.9 mg/dL — ABNORMAL HIGH (ref 2.3–4.6)
Potassium: 3.4 mEq/L — ABNORMAL LOW (ref 3.5–5.1)
Potassium: 3.6 mEq/L (ref 3.5–5.1)
Potassium: 4.5 mEq/L (ref 3.5–5.1)
Potassium: 4.5 mEq/L (ref 3.5–5.1)
Sodium: 135 mEq/L (ref 135–145)
Sodium: 138 mEq/L (ref 135–145)
Sodium: 140 mEq/L (ref 135–145)

## 2010-12-28 LAB — COMPREHENSIVE METABOLIC PANEL
ALT: 14 U/L (ref 0–53)
AST: 17 U/L (ref 0–37)
Calcium: 8.7 mg/dL (ref 8.4–10.5)
GFR calc Af Amer: 9 mL/min — ABNORMAL LOW (ref >60)
Sodium: 135 mEq/L (ref 135–145)
Total Protein: 6.1 g/dL (ref 6.0–8.3)

## 2010-12-28 LAB — URINE MICROSCOPIC-ADD ON

## 2010-12-28 LAB — LIPID PANEL
Cholesterol: 188 mg/dL (ref 0–200)
HDL: 29 mg/dL — ABNORMAL LOW (ref >39)
LDL Cholesterol: UNDETERMINED mg/dL (ref 0–99)
Total CHOL/HDL Ratio: 6.5 RATIO

## 2010-12-28 LAB — BASIC METABOLIC PANEL WITH GFR
Calcium: 9.6 mg/dL (ref 8.4–10.5)
Glucose, Bld: 111 mg/dL — ABNORMAL HIGH (ref 70–99)
Sodium: 136 meq/L (ref 135–145)

## 2010-12-28 LAB — MAGNESIUM: Magnesium: 2 mg/dL (ref 1.5–2.5)

## 2010-12-28 LAB — PTH, INTACT AND CALCIUM
Calcium, Total (PTH): 8.2 mg/dL — ABNORMAL LOW (ref 8.4–10.5)
Calcium, Total (PTH): 9.2 mg/dL (ref 8.4–10.5)

## 2010-12-28 LAB — IRON AND TIBC
Iron: 45 ug/dL (ref 42–135)
Saturation Ratios: 19 % — ABNORMAL LOW (ref 20–55)
TIBC: 242 ug/dL (ref 215–435)
UIBC: 187 ug/dL

## 2010-12-28 LAB — CULTURE, BLOOD (ROUTINE X 2)
Culture: NO GROWTH
Culture: NO GROWTH
Report Status: 3182011
Report Status: 3182011

## 2010-12-28 LAB — CK TOTAL AND CKMB (NOT AT ARMC)
CK, MB: 5.2 ng/mL — ABNORMAL HIGH (ref 0.3–4.0)
Relative Index: 2.7 — ABNORMAL HIGH (ref 0.0–2.5)
Total CK: 192 U/L (ref 7–232)

## 2010-12-28 LAB — PHOSPHORUS
Phosphorus: 5 mg/dL — ABNORMAL HIGH (ref 2.3–4.6)
Phosphorus: 6.4 mg/dL — ABNORMAL HIGH (ref 2.3–4.6)

## 2010-12-28 LAB — METANEPHRINES, URINE, 24 HOUR
Metaneph Total, Ur: 1011 ug/(24.h) — ABNORMAL HIGH (ref 182–739)
Metanephrines, Ur: 204 ug/(24.h) — ABNORMAL HIGH (ref 58–203)
Normetanephrine, 24H Ur: 807 ug/(24.h) — ABNORMAL HIGH (ref 88–649)
Volume, Urine-METAN: 4075 mL

## 2010-12-28 LAB — ANTISTREPTOLYSIN O TITER: ASO: 76 IU/mL (ref 0–116)

## 2010-12-28 LAB — PROTEIN ELECTROPH W RFLX QUANT IMMUNOGLOBULINS
Albumin ELP: 60.1 % (ref 55.8–66.1)
Alpha-1-Globulin: 5.5 % — ABNORMAL HIGH (ref 2.9–4.9)
Beta Globulin: 6.1 % (ref 4.7–7.2)
Total Protein ELP: 6.4 g/dL (ref 6.0–8.3)

## 2010-12-28 LAB — ALT: ALT: 15 U/L (ref 0–53)

## 2010-12-28 LAB — ALDOSTERONE + RENIN ACTIVITY W/ RATIO
Aldosterone: 18 ng/dL
PRA LC/MS/MS: 6.97 ng/mL/h — ABNORMAL HIGH (ref 0.25–5.82)

## 2010-12-28 LAB — RAPID URINE DRUG SCREEN, HOSP PERFORMED
Amphetamines: NOT DETECTED
Barbiturates: NOT DETECTED
Benzodiazepines: NOT DETECTED
Cocaine: NOT DETECTED
Opiates: NOT DETECTED
Tetrahydrocannabinol: NOT DETECTED

## 2010-12-28 LAB — PROTIME-INR
INR: 0.95 (ref 0.00–1.49)
Prothrombin Time: 12.6 seconds (ref 11.6–15.2)

## 2010-12-28 LAB — CARDIAC PANEL(CRET KIN+CKTOT+MB+TROPI)
CK, MB: 3.1 ng/mL (ref 0.3–4.0)
CK, MB: 3.9 ng/mL (ref 0.3–4.0)
Relative Index: 3.1 — ABNORMAL HIGH (ref 0.0–2.5)
Troponin I: 0.18 ng/mL — ABNORMAL HIGH (ref 0.00–0.06)

## 2010-12-28 LAB — HEPATITIS B SURFACE ANTIGEN: Hepatitis B Surface Ag: NEGATIVE

## 2010-12-28 LAB — CREATININE CLEARANCE, URINE, 24 HOUR
Creatinine Clearance: 20 mL/min — ABNORMAL LOW (ref 75–125)
Creatinine, 24H Ur: 2453 mg/d — ABNORMAL HIGH (ref 800–2000)
Creatinine, Urine: 60.2 mg/dL
Creatinine: 8.41 mg/dL — ABNORMAL HIGH (ref 0.4–1.5)

## 2010-12-28 LAB — TROPONIN I: Troponin I: 0.18 ng/mL — ABNORMAL HIGH (ref 0.00–0.06)

## 2010-12-28 LAB — VITAMIN B12: Vitamin B-12: 360 pg/mL (ref 211–911)

## 2010-12-28 LAB — HEPATITIS B CORE ANTIBODY, TOTAL: Hep B Core Total Ab: NEGATIVE

## 2010-12-28 LAB — HEMOCCULT GUIAC POC 1CARD (OFFICE): Fecal Occult Bld: NEGATIVE

## 2010-12-28 LAB — MRSA PCR SCREENING: MRSA by PCR: NEGATIVE

## 2010-12-28 LAB — C3 COMPLEMENT: C3 Complement: 126 mg/dL (ref 88–201)

## 2010-12-28 LAB — HEMOGLOBIN A1C: Mean Plasma Glucose: 134 mg/dL

## 2010-12-28 LAB — APTT: aPTT: 26 s (ref 24–37)

## 2010-12-29 NOTE — Op Note (Signed)
  [name], Billy               ACCOUNT NO.:  [id number]  MEDICAL RECORD NO.:  [id number]           PATIENT TYPE:  O  LOCATION:  SDSC                         FACILITY:  [facility]  PHYSICIAN:  Nelda Severe. Kellie Simmering, M.D.  DATE OF BIRTH:  11-Mar-1966  DATE OF PROCEDURE:  12/24/2010 DATE OF DISCHARGE:  12/24/2010                              OPERATIVE REPORT   PREOPERATIVE DIAGNOSIS:  End-stage renal disease.  POSTOPERATIVE DIAGNOSIS:  End-stage renal disease.  OPERATION:  Creation of a right upper arm brachial artery to cephalic vein arteriovenous fistula (Kauffman shunt).  SURGEON:  Nelda Severe. Kellie Simmering, MD  ASSISTANT:  Wray Kearns, PA-C  ANESTHESIA:  General endotracheal.  PROCEDURE:  The patient was taken to the operating room and placed in the supine position at which time satisfactory general anesthesia - LMA was induced.  Right upper extremity was prepped with Betadine scrub and solution and draped in routine sterile manner.  A short transverse incision was made in the antecubital area, antecubital vein dissected free.  Cephalic branch was excellent in this area measuring up to 4 mm in size.  It was traced down to its junction with the basilic where it was ligated, preserving the basilic vein.  Brachial artery was exposed, encircled with vessel loops.  It had an excellent pulse.  A 3 Fogarty catheter was passed up the cephalic vein and would traverse the entire vein up to the shoulder level.  It was felt that it was worth attempting an AV fistula, although the caliber of the vein was questionable in the upper arm proximally.  The artery was occluded proximally and distally with vessel loops, opened 15 blade, extended with Potts scissors.  The vein was carefully measured, spatulated, and anastomosed end-to-side with 6-0 Prolene.  Following this, the vessel loops were released and there was a pulse and a weak thrill in the fistula with Doppler flow up to the mid upper arm.  The  radial and ulnar arterial flow were both present and unaffected by the flow in the fistula.  Adequate hemostasis was achieved.  Once again, we heard Doppler flow in the upper arm.  It was decided to see if this would mature over the next few weeks or whether he would need attempted basilic vein transposition.  Following closure, adequate hemostasis was achieved.  Wound was closed in layers with Vicryl in subcuticular fashion with Dermabond.  The patient was taken to the recovery room in stable condition.    Nelda Severe Kellie Simmering, M.D.    JDL/MEDQ  D:  12/24/2010  T:  12/25/2010  Job:  [id number]  Electronically Signed by Tinnie Gens M.D. on 12/29/2010 [id number] PM

## 2011-01-05 ENCOUNTER — Ambulatory Visit: Payer: Medicare Other | Admitting: Vascular Surgery

## 2011-01-21 ENCOUNTER — Ambulatory Visit (INDEPENDENT_AMBULATORY_CARE_PROVIDER_SITE_OTHER): Payer: Medicare Other

## 2011-01-21 DIAGNOSIS — N186 End stage renal disease: Secondary | ICD-10-CM

## 2011-01-21 NOTE — Assessment & Plan Note (Signed)
OFFICE VISIT  HA, KRENGEL DOB:  09/07/1966                                       01/21/2011 [id number]  HISTORY:  This 45 year old gentleman is status post creation of a right upper arm brachiocephalic AV fistula by Dr. Kellie Simmering on 12/24/2010.  He has no complaints of numbness, tingling or pain in his hand.  He is presently dialyzing through a left brachiocephalic AV fistula which has large pseudoaneurysms in it.  He states that he has been having difficulty with some bleeding from the left upper arm fistula if they stick it near the large pseudoaneurysms.  PHYSICAL EXAMINATION:  He is a well-developed, well-nourished gentleman in no acute distress.  He is alert and oriented x3.  His pulse is 68. His saturations are 100%.  His respiratory rate is 12.  Bilateral upper extremities:  He has palpable radial pulses.  Both hands are warm and well-perfused with good motion and sensation.  He has a good thrill and bruit in the right upper arm brachiocephalic fistula which is easily palpable.  The left brachiocephalic fistula has a very large pseudoaneurysm on the proximal portion through mid fistula.  ASSESSMENT/PLAN:  Maturing right upper arm brachiocephalic AV fistula. Plan is to have the patient follow up in 6 weeks with Dr. Kellie Simmering to discuss ligation of the left upper arm AV fistula after he has dialyzed successfully a number of times through the right upper arm brachiocephalic fistula.  Wray Kearns, PA-C  Charles E. Fields, MD Electronically Signed  RR/MEDQ  D:  01/21/2011  T:  01/21/2011  Job:  [id number]

## 2011-02-16 NOTE — Assessment & Plan Note (Signed)
OFFICE VISIT   Billy [name], Billy [name]  DOB:  18-Nov-1965                                       03/10/2010  [id number]   The patient returns 6 weeks post creation of a left upper arm brachial  artery to cephalic vein AV fistula.  He had attempted creation of a left  forearm fistula which thrombosed early because of small size of the  vein.  His upper arm fistula is functioning nicely with an excellent  pulse and palpable thrill and no symptoms of steal distally.  He is  continuing to exercise the arm.   On exam the incision has healed nicely and he has an excellent pulse and  palpable thrill up to the shoulder level.  I think this will be a good  fistula which should be ready for use in late July allowing it 3 months  to mature.  He will return to see [place] on a p.r.n. basis.     Nelda Severe Kellie Simmering, M.D.  Electronically Signed   JDL/MEDQ  D:  03/10/2010  T:  03/11/2010  Job:  [id number]

## 2011-02-16 NOTE — Assessment & Plan Note (Signed)
OFFICE VISIT   KNIGHTLY, MARKELL  DOB:  December 03, 1965                                       01/27/2010  [id number]   The patient has end-stage renal disease who had AV fistula created by me  left radial artery to cephalic vein on April 14.  The patient had  dialysis earlier today and it was noted that the fistula has thrombosed.  He stated it probably thrombosed a few days ago.  At the time of surgery  it was not noted to be an excellent vein and vein mapping was less than  0.3 cm.  This vein is bigger in the upper arm on the left and I imaged  it with the SonoSite today and also the vein mapping done previously  revealed a larger upper arm cephalic vein.  We will schedule him for  creation of left upper arm fistula on Thursday of this week April 28 and  hopefully this will provide a satisfactory site for vascular access.     Nelda Severe Kellie Simmering, M.D.  Electronically Signed   JDL/MEDQ  D:  01/27/2010  T:  01/28/2010  Job:  [id number]

## 2011-02-16 NOTE — Procedures (Signed)
CEPHALIC VEIN MAPPING   INDICATION:  AV fistula placement.   HISTORY:   EXAM:  The right cephalic vein is compressible.   Diameter measurements range from 0.31 to 0.54 cm.   The right basilic vein is compressible.   Diameter measurements range from 0.28 to 0.47 cm.   The left cephalic vein is compressible.   Diameter measurements range from 0.22 to 0.58 cm.   The left basilic vein is compressible.   Diameter measurements range from 0.48 to 0.58.   See attached worksheet for all measurements.   IMPRESSION:  Patent bilateral cephalic and basilic veins are of  acceptable diameter for use as a dialysis access site.   ___________________________________________  Nelda Severe. Kellie Simmering, M.D.   CB/MEDQ  D:  01/07/2010  T:  01/07/2010  Job:  [id number]

## 2011-02-16 NOTE — Consult Note (Signed)
NEW PATIENT CONSULTATION   Billy [name], Billy [name]  DOB:  03-17-1966                                       01/06/2010  [id number]   The patient is a 45 year old male patient recently found to have end-  stage renal disease currently on hemodialysis on Monday, Wednesday,  Friday.  He was admitted to [facility] on March 5 with severe  headaches and found to be severely hypertensive and to have end-stage  renal disease.  He had previously had normal renal function in 2007 at  the time he had shoulder surgery.   CHRONIC MEDICAL PROBLEMS:  1. End-stage renal disease.  2. Hypertension.  3. Hyperlipidemia.  4. Negative for coronary artery disease, diabetes or stroke.   PAST SURGICAL HISTORY:  1. Left shoulder surgery x3 in 2007.  2. Right hand surgery many years ago for an injury in the service.   FAMILY HISTORY:  Not available.  The patient is adopted.   SOCIAL HISTORY:  He is married, has three children.  Previously worked  in [employer], currently not working.  Does  not use tobacco, drinks occasional alcohol.   REVIEW OF SYSTEMS:  Negative for chest pain, dyspnea on exertion, PND,  orthopnea.  Denies any GI or pulmonary symptoms.  No lower extremity  claudication.  Is able to walk several miles.  Denies any neurologic,  musculoskeletal, psychiatric symptoms.  Does have anemia from his renal  failure.  Otherwise all systems are negative.   PHYSICAL EXAM:  Vital signs:  Blood pressure 155/97, heart rate 73,  temperature 97.8.  General:  He is a well-developed, well-nourished male  who is in no apparent distress, alert and oriented x3.  HEENT:  Exam is  normal.  EOMs intact.  Chest:  Clear to auscultation.  No rhonchi or  wheezing.  Cardiovascular:  Regular rhythm.  No murmurs.  Carotid pulses  3+.  No audible bruits.  Abdomen:  Soft, nontender with no masses.  Musculoskeletal:  Exam is free of deformities.  Neurologic:  Exam is  normal.  Skin:  Free of rashes.  Upper extremity:  Exam reveals 3+  brachial and radial pulses bilaterally.  He appears to have patent  cephalic veins both in the upper arms and forearms bilaterally.   Today I ordered upper extremity vein mapping which I reviewed and  interpreted.  Cephalic veins appear to be adequate in both upper  extremities of pretty similar size.  Basilic veins are also widely  patent.   I think the best plan initially would be to create a left forearm AV  fistula (Cimino).  If that vein is found to be inadequate at the time of  surgery we will proceed with a Kauffman fistula on the left side since  he is right-handed.  I have scheduled that for Thursday, April 14 at  [facility] to be done as an outpatient.     Nelda Severe Kellie Simmering, M.D.  Electronically Signed   JDL/MEDQ  D:  01/06/2010  T:  01/07/2010  Job:  [id number]   cc:   Elzie Rings. Lorrene Reid, M.D.

## 2011-02-19 NOTE — H&P (Signed)
   [name], [name]                         ACCOUNT NO.:  [id number]   MEDICAL RECORD NO.:  [id number]                   PATIENT TYPE:  INP   LOCATION:  [id number]                                 FACILITY:  [facility]   PHYSICIAN:  Marshall Cork. Jeffie Pollock, M.D.                 DATE OF BIRTH:  06/19/1966   DATE OF ADMISSION:  07/02/2002  DATE OF DISCHARGE:                                HISTORY & PHYSICAL   PHYSICIAN:  Dr. Rana Snare.   CHIEF COMPLAINT:  Left flank pain, nausea, and vomiting.   HISTORY OF PRESENT ILLNESS:  The patient is a 45 year old with a history of  stones.  He had an onset yesterday of severe left flank pain associated with  nausea and vomiting.  He could not get complete relief of his nausea and  pain with oral medications and was admitted for pain control.   ALLERGIES:  SHELLFISH.   MEDICATIONS:  Allegra q.d.   PAST MEDICAL HISTORY:  Pertinent for stones.   PAST SURGICAL HISTORY:  Unremarkable.   SOCIAL HISTORY:  Negative for tobacco.  He drinks occasional alcohol.   FAMILY HISTORY:  Unremarkable.   REVIEW OF SYSTEMS:  He has had nausea, vomiting, pain in the flank, and  irritative voiding symptoms.  Otherwise without complaints.   PHYSICAL EXAMINATION:  VITAL SIGNS:  Temperature 98.7, heart rate 94,  respirations 16, blood pressure 162/97.  GENERAL:  Well-developed, well-nourished white male, medicated, in no acute  distress.  Alert and oriented x 3.  HEENT:  Normocephalic, atraumatic.  NECK:  Supple.  LUNGS:  Clear to normal effort.  HEART:  Regular rate and rhythm.  ABDOMEN:  Soft, flat.  Without masses or hepatosplenomegaly.  There is  severe left CVA tenderness, left lower quadrant tenderness.  GENITOURINARY:  Not done.  RECTAL:  Not done.  EXTREMITIES:  Full range of motion.  Without edema.  NEUROLOGIC:  Grossly intact.  SKIN:  Warm and dry.    IMPRESSION:  Left distal ureteral stone with pain and nausea.   PLAN:  Admit for pain control and  antiemetics.  He will strain his urine.  Get a KUB in the morning.                                               Marshall Cork. Jeffie Pollock, M.D.    JJW/MEDQ  D:  07/03/2002  T:  07/03/2002  Job:  [id number]   cc:   Bernestine Amass, M.D.

## 2011-02-19 NOTE — Op Note (Signed)
NAMEMADDIX, LEBEN NO.:  [id number]   MEDICAL RECORD NO.:  [id number]          PATIENT TYPE:  OIB   LOCATION:  5024                         FACILITY:  [facility]   PHYSICIAN:  Marily Memos, MD      DATE OF BIRTH:  [birth year]   DATE OF PROCEDURE:  04/04/2006  DATE OF DISCHARGE:  04/05/2006                                 OPERATIVE REPORT   PREOPERATIVE DIAGNOSIS:  Comminuted fracture of the clavicle.   POSTOPERATIVE DIAGNOSIS:  Comminuted fracture of the clavicle.   ANESTHESIA:  General.   PROCEDURE:  Open reduction and internal fixation left clavicle with  compression plate using six screws.   SURGEON:  Marily Memos, M.D.   DESCRIPTION OF PROCEDURE:  The patient was taken to the operating room and  after giving adequate preoperative indications and given general anesthesia  and intubated.  The patient was placed in the barber chair position.  Incision was made over the left clavicle going through the skin and  subcutaneous tissue down to the clavicle, soft tissue subperiosteally from  the fracture site.  Comminuted fragments were identified and manipulated,  reduction and visualization with the use of mini C-arm.  After adequate  reduction, compression plate was placed across the fracture site with six  compression screw stabilizing the fracture.  Copious irrigation was then  done.  Closure was then done with 2-0 Vicryl in the subcutaneous and skin  staples.  Compressive dressing was applied.  The patient was placed in  immobilizing sling.  The patient went to the recovery room in stable and  satisfactory condition.      Marily Memos, MD  Electronically Signed     AC/MEDQ  D:  05/24/2006  T:  05/25/2006  Job:  [id number]

## 2011-02-19 NOTE — Op Note (Signed)
Billy [name], Billy [name]               ACCOUNT NO.:  [id number]   MEDICAL RECORD NO.:  [id number]          PATIENT TYPE:  AMB   LOCATION:  SDS                          FACILITY:  [facility]   PHYSICIAN:  Marily Memos, MD      DATE OF BIRTH:  [birth year]   DATE OF PROCEDURE:  07/07/2006  DATE OF DISCHARGE:  07/07/2006                                 OPERATIVE REPORT   PREOP DIAGNOSIS:  1. Painful loose hardware left clavicle.  2. Painful left shoulder preop rotator cuff tear.   POSTOP DIAGNOSIS:  1. Loose hardware left clavicle plate and screws.  2. Subacromial bursitis with some impingement; rotator cuff is intact   ANESTHESIA:  General.   PROCEDURE:  1. Arthroscopic evaluation and synovectomy left shoulder.  2. Removal of compression plate and screws with 6 screws in plate.   DESCRIPTION OF PROCEDURE:  The patient was taken to the operating room; and  after given adequate preop medications given general anesthesia and  intubated.  The patient was placed n the barber-chair position.  Left  shoulder was prepped with DuraPrep and draped in a sterile manner.  A 1/2-  inch puncture wound made posteriorly a swisher rod was placed posterior-to-  anterior; inflow was anteriorly.  Separate lateral incision made over the  synovial shaver.  Inspection of the joint revealed rotator cuff was intact  but hypertrophic subacromial bursal sac with some mild impingement.  Rotator  cuff, itself, was visualized anteriorly and posteriorly with the shoulder  internally and externally rotated and was found to be intact.  With the  synovial shaver a synovectomy was done with resection of the subacromial  bursal sac.   Next, incision was made over the previous scar over the clavicle going  through the skin and subcutaneous tissue.  Sharp and blunt dissection made  down to the plate and screws.  There was bone grown over the plate.  Two  more distal screws in the plate were loose.  Six screws were removed;  plate  was removed; irrigation was then done.  Wound closure was then done with 2-0  Vicryl for the subcutaneous, skin staples for the skin.  A bulky compressive  dressing was applied; and sling was applied.  The patient tolerated the  procedure quite well and went to the recovery room in stable and  satisfactory condition.  The patient is being discharged home on Percocet  10/650 one q.6 h. p.r.n. for pain, ice packs, and use of sling.  The patient  is being discharged in stable and satisfactory condition.      Marily Memos, MD  Electronically Signed     AC/MEDQ  D:  07/07/2006  T:  07/08/2006  Job:  [id number]

## 2011-03-02 ENCOUNTER — Ambulatory Visit: Payer: Medicare Other | Admitting: Vascular Surgery

## 2011-03-16 ENCOUNTER — Ambulatory Visit: Payer: Medicare Other | Admitting: Vascular Surgery

## 2011-06-14 ENCOUNTER — Encounter: Payer: Self-pay | Admitting: Vascular Surgery

## 2011-06-15 ENCOUNTER — Ambulatory Visit: Payer: Medicare Other | Admitting: Vascular Surgery

## 2011-07-02 ENCOUNTER — Other Ambulatory Visit (HOSPITAL_COMMUNITY): Payer: Medicare Other

## 2011-07-02 LAB — SURGICAL PCR SCREEN: MRSA, PCR: NEGATIVE

## 2011-07-07 ENCOUNTER — Ambulatory Visit (HOSPITAL_COMMUNITY): Payer: Medicare Other

## 2011-07-07 ENCOUNTER — Other Ambulatory Visit: Payer: Self-pay | Admitting: Orthopedic Surgery

## 2011-07-07 ENCOUNTER — Ambulatory Visit (HOSPITAL_COMMUNITY)
Admission: RE | Admit: 2011-07-07 | Discharge: 2011-07-08 | Disposition: A | Discharge status: Home / Self Care | Payer: Medicare Other | Source: Ambulatory Visit | Attending: Orthopedic Surgery | Admitting: Orthopedic Surgery

## 2011-07-07 DIAGNOSIS — Z992 Dependence on renal dialysis: Secondary | ICD-10-CM | POA: Insufficient documentation

## 2011-07-07 DIAGNOSIS — N189 Chronic kidney disease, unspecified: Secondary | ICD-10-CM | POA: Insufficient documentation

## 2011-07-07 DIAGNOSIS — K219 Gastro-esophageal reflux disease without esophagitis: Secondary | ICD-10-CM | POA: Insufficient documentation

## 2011-07-07 DIAGNOSIS — I129 Hypertensive chronic kidney disease with stage 1 through stage 4 chronic kidney disease, or unspecified chronic kidney disease: Secondary | ICD-10-CM | POA: Insufficient documentation

## 2011-07-07 DIAGNOSIS — M5126 Other intervertebral disc displacement, lumbar region: Secondary | ICD-10-CM | POA: Insufficient documentation

## 2011-07-07 DIAGNOSIS — M79609 Pain in unspecified limb: Secondary | ICD-10-CM | POA: Insufficient documentation

## 2011-07-07 DIAGNOSIS — Z79899 Other long term (current) drug therapy: Secondary | ICD-10-CM | POA: Insufficient documentation

## 2011-07-07 DIAGNOSIS — F410 Panic disorder [episodic paroxysmal anxiety] without agoraphobia: Secondary | ICD-10-CM | POA: Insufficient documentation

## 2011-07-07 DIAGNOSIS — M216X9 Other acquired deformities of unspecified foot: Secondary | ICD-10-CM | POA: Insufficient documentation

## 2011-07-07 DIAGNOSIS — F411 Generalized anxiety disorder: Secondary | ICD-10-CM | POA: Insufficient documentation

## 2011-07-07 LAB — URINALYSIS, ROUTINE W REFLEX MICROSCOPIC
Glucose, UA: NEGATIVE mg/dL
Hgb urine dipstick: NEGATIVE
Nitrite: NEGATIVE
Specific Gravity, Urine: 1.021 (ref 1.005–1.030)
pH: 5 (ref 5.0–8.0)

## 2011-07-07 LAB — COMPREHENSIVE METABOLIC PANEL
ALT: 12 U/L (ref 0–53)
AST: 11 U/L (ref 0–37)
CO2: 25 mEq/L (ref 19–32)
Calcium: 9.4 mg/dL (ref 8.4–10.5)
Potassium: 4 mEq/L (ref 3.5–5.1)
Sodium: 135 mEq/L (ref 135–145)
Total Protein: 7.2 g/dL (ref 6.0–8.3)

## 2011-07-07 LAB — URINE MICROSCOPIC-ADD ON

## 2011-07-07 LAB — CBC
HCT: 41.2 % (ref 39.0–52.0)
Hemoglobin: 14.1 g/dL (ref 13.0–17.0)
MCH: 30.8 pg (ref 26.0–34.0)
MCV: 90 fL (ref 78.0–100.0)
Platelets: 176 10*3/uL (ref 150–400)
RBC: 4.58 MIL/uL (ref 4.22–5.81)
WBC: 13.7 10*3/uL — ABNORMAL HIGH (ref 4.0–10.5)

## 2011-07-07 LAB — PROTIME-INR
INR: 0.99 (ref 0.00–1.49)
Prothrombin Time: 13.3 seconds (ref 11.6–15.2)

## 2011-07-07 LAB — ABO/RH: ABO/RH(D): O NEG

## 2011-07-17 NOTE — Discharge Summary (Signed)
  NAMEDANNIEL, HAZEL NO.:  [id number]  MEDICAL RECORD NO.:  [id number]  LOCATION:  74                         FACILITY:  [facility]  PHYSICIAN:  Kipp Brood. Daiya Tamer, M.D.DATE OF BIRTH:  26-Dec-1965  DATE OF ADMISSION:  07/07/2011 DATE OF DISCHARGE:  07/08/2011                              DISCHARGE SUMMARY   He was taken to Surgery on July 07, 2011 at which time I did a decompressive lumbar laminectomy at L4-5, L5-S1 on the left and  I did a microdiskectomy at L4-5 on the left.  At that time, disk material was sent as a specimen to the lab from the L4-5 space.  Postop, he did extremely well.  I saw him today prior to discharge.  He had this foot- drop that was completely absent.  He was happy it was resolved.  He had a little thigh pain on the left.  No problems in regards to his dressings.  He has been up ambulating.  The final discharge date is today on July 08, 2011.  DISCHARGE MEDICINES:  All on his discharge manager that are all typed up.  DISCHARGE LABS:  Sodium 135, potassium 4.0, chloride 94.  His creatinine was 7.20, but he is on dialysis.  His BUN was 49.  His next dialysis day is tomorrow.  His alkaline phosphatase was 12, SGOT was 11, SGPT was 12. Alkaline phosphatase was 66, bilirubin 0.4.  PTT was 26, INR 0.99, prothrombin time was 13.3.  White count was 13,700, his hemoglobin 14.1, hematocrit 41.2, his platelets 176,000.  Urinalysis was negative.  Chest x-ray showed no active lung disease.  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  He also had lateral recess stenosis at L4-5 on the left.  He had lateral recess stenosis at L5-S1 on the left.  DISCHARGE CONDITION:  Improved.  DISCHARGE DIET:  He will remain on the discharge preop diet.  DISCHARGE INSTRUCTIONS:  He will go on for his dialysis as instructed tomorrow and I will see him in the office 2 weeks or prior to that  if there is a problem.          ______________________________ Kipp Brood Gladstone Lighter, M.D.     RAG/MEDQ  D:  07/08/2011  T:  07/09/2011  Job:  [id number]  Electronically Signed by Latanya Maudlin M.D. on 07/17/2011 10:03:21 AM

## 2011-07-17 NOTE — Op Note (Signed)
Billy [name], [name] NO.:  [id number]  MEDICAL RECORD NO.:  [id number]  LOCATION:  18                         FACILITY:  [name]  PHYSICIAN:  Billy [name], M.D.DATE OF BIRTH:  [birth year]  DATE OF PROCEDURE:  07/07/2011 DATE OF DISCHARGE:                              OPERATIVE REPORT   SURGEON:  Billy Brood. Billy [name], M.D.  ASSISTANT:  Billy [name], M.D.  Basically, he had a large herniated foraminal disk at L4-L5 on the left.  PREOPERATIVE DIAGNOSES: 1. Large herniated foraminal disk at L4-L5 on the left. 2. Partial foot drop on the left. 3. Lateral recess stenosis at L4-L5 on the left. 4. A lateral recess stenosis at L5-S1 with a bulging disk on the left.  POSTOPERATIVE DIAGNOSES: 1. Large herniated foraminal disk at L4-L5 on the left. 2. Partial foot drop on the left. 3. Lateral recess stenosis at L4-L5 on the left. 4. The lateral recess stenosis at L5-S1 with a bulging disk on the     left.  PROCEDURE IN DETAIL:  Under general anesthesia, routine orthopedic prep and drape of the lower back was carried out with the patient on a spinal frame.  He had 2 g of IV Ancef.  Appropriate time-out was carried out in the operating room prior to surgery.  Prior to this, I marked the appropriate left side of his back in the holding area.  This was done because he had pain in the left leg only and also because of the foot drop on the left.  After the time-out with sterile prep and drape, two needles were placed in the back for localization purposes.  X-ray was taken.  We then verified that we were at the L4-L5, L5-S1 space.  An incision was made in the midline.  Bleeders were identified and cauterized.  I then separated muscle from the lamina bilaterally, mainly on the right side just for retraction, mainly for insertion of the Billy [name] retractors.  The muscle was stripped from the lamina spinous process on the left at L4-L5 and L5-S1.  Another x-ray was  taken with markers on the spinous processes.  At this time, we then went out and carried out a hemilaminectomy at L4-L5.  Billy [name] did a portion of the dissection as well as held the Billy [name] retractor as well as operating the microscope during the procedure.  We went down and gently removed the ligamentum flavum.  I then gently retracted the dura and he went out laterally to decompress the lateral recess with stenosis.  After this was done  __________ cauterized the lateral recess veins with the bipolar.  We then retracted the dura, made a cruciate incision posterior and longitudinal ligaments, and a complete diskectomy was carried out. He had large fragments out into the foramen that we gradually teased out with a nerve hook and with the Epstein curettes.  We were careful not to go too far laterally.  After we did this, we made multiple attempts to go subligamentous and also went down into the disk space.  We then were able to easily pass a hockey-stick to the root above and below.  After this was totally decompressed, we thoroughly irrigated  the area, loosely applied some Gelfoam, and another x-ray was taken with the instrument in the L4-L5 space.  We then went down to L5-S1.  Once levelled down and carried out a hemilaminectomy in the usual fashion, we removed a portion of the ligamentum flavum.  We identified the S1 root and identified the disk, really was a hard disk, there was no true__________, it was just very tight in the lateral recess.  We decompressed the lateral recess in the usual fashion.  I did a portion of this.  Billy [name] did a portion as well.  We then thoroughly irrigated out the area.  We passed a hockey- stick easily out the S1 foramen and were now free.  The roots above the L4-5 root and the S1 root were free also, it went out into the foramen at the L4-L5 space on the left and to make sure there was no compression.  We irrigated the wound.  I loosely applied some  thrombin- soaked Gelfoam, closed the wound in layers in the usual fashion.  I did leave a small distal deep and proximal deep part of the wound open for drainage purpose.  Subcu was closed with 0 Vicryl, skin with metal staples, and sterile Neosporin dressing was applied.  The patient had 2 g of IV Ancef preop.  A specimen was sent to the lab from the L4-5 disk space on the left.          ______________________________ Billy Brood. Billy [name], M.D.     RAG/MEDQ  D:  07/07/2011  T:  07/07/2011  Job:  [id number]  cc:   Billy [name], M.D. [phone]  Electronically Signed by Billy [name] M.D. on 07/17/2011 10:03:23 AM

## 2011-09-02 ENCOUNTER — Other Ambulatory Visit (HOSPITAL_BASED_OUTPATIENT_CLINIC_OR_DEPARTMENT_OTHER): Payer: Self-pay | Admitting: Family Medicine

## 2011-09-02 DIAGNOSIS — IMO0002 Reserved for concepts with insufficient information to code with codable children: Secondary | ICD-10-CM

## 2011-09-04 ENCOUNTER — Ambulatory Visit (HOSPITAL_BASED_OUTPATIENT_CLINIC_OR_DEPARTMENT_OTHER): Payer: Medicare Other

## 2011-09-14 ENCOUNTER — Other Ambulatory Visit (HOSPITAL_BASED_OUTPATIENT_CLINIC_OR_DEPARTMENT_OTHER): Payer: Medicare Other

## 2011-09-25 ENCOUNTER — Ambulatory Visit (HOSPITAL_BASED_OUTPATIENT_CLINIC_OR_DEPARTMENT_OTHER): Payer: Medicare Other

## 2011-10-07 DIAGNOSIS — N186 End stage renal disease: Secondary | ICD-10-CM | POA: Diagnosis not present

## 2011-10-07 DIAGNOSIS — D509 Iron deficiency anemia, unspecified: Secondary | ICD-10-CM | POA: Diagnosis not present

## 2011-10-07 DIAGNOSIS — N2581 Secondary hyperparathyroidism of renal origin: Secondary | ICD-10-CM | POA: Diagnosis not present

## 2011-10-11 DIAGNOSIS — M25819 Other specified joint disorders, unspecified shoulder: Secondary | ICD-10-CM | POA: Diagnosis not present

## 2011-10-15 ENCOUNTER — Ambulatory Visit: Payer: Medicare Other

## 2011-10-16 DIAGNOSIS — N2581 Secondary hyperparathyroidism of renal origin: Secondary | ICD-10-CM | POA: Diagnosis not present

## 2011-10-16 DIAGNOSIS — D509 Iron deficiency anemia, unspecified: Secondary | ICD-10-CM | POA: Diagnosis not present

## 2011-10-16 DIAGNOSIS — N186 End stage renal disease: Secondary | ICD-10-CM | POA: Diagnosis not present

## 2011-10-20 ENCOUNTER — Ambulatory Visit: Payer: Medicare Other

## 2011-10-26 ENCOUNTER — Encounter: Payer: Self-pay | Admitting: Thoracic Diseases

## 2011-10-27 ENCOUNTER — Encounter (HOSPITAL_COMMUNITY): Payer: Self-pay | Admitting: Pharmacy Technician

## 2011-10-27 ENCOUNTER — Other Ambulatory Visit: Payer: Self-pay | Admitting: *Deleted

## 2011-10-27 ENCOUNTER — Encounter: Payer: Self-pay | Admitting: *Deleted

## 2011-10-27 ENCOUNTER — Encounter: Payer: Self-pay | Admitting: Thoracic Diseases

## 2011-10-27 ENCOUNTER — Ambulatory Visit (INDEPENDENT_AMBULATORY_CARE_PROVIDER_SITE_OTHER): Payer: Medicare Other | Admitting: Thoracic Diseases

## 2011-10-27 VITALS — BP 203/117 | HR 88 | Resp 20 | Ht 70.0 in | Wt 200.0 lb

## 2011-10-27 DIAGNOSIS — I729 Aneurysm of unspecified site: Secondary | ICD-10-CM | POA: Diagnosis not present

## 2011-10-27 DIAGNOSIS — Z992 Dependence on renal dialysis: Secondary | ICD-10-CM | POA: Insufficient documentation

## 2011-10-27 DIAGNOSIS — N186 End stage renal disease: Secondary | ICD-10-CM | POA: Diagnosis not present

## 2011-10-27 NOTE — H&P (Signed)
[facility] [place] History and Physical 10/27/2011 [id number]  [id number] left arm Pseudoaneurysm, Brachiocephalic AVF Referring Physician:Dr. Lorrene Reid , HD T [facility] S  History of Present Illness: Billy [name] is a 46 y.o. male who had a left Brachiocephalic AVF placed by Dr. Kellie Simmering in April of 2011. This had been functioning well but developed a large pseudoaneurysm and a new B-C AVF was placed in the right arm which is functioning well. Pt returned to work and has been doing heavy lifting. The pseudoaneurysm has been progressively getting bigger over the past few months and he states it has become somewhat uncomfortable an d he has trouble bending his arm. He would like this ligated and possibly have the pseudoaneurysm removed.  Past Medical History  Diagnosis Date  . Hypertension   . Hyperlipidemia   . Anxiety   . Anemia   . Joint pain   . Chronic kidney disease     Past Surgical History  Procedure Date  . Open anterior shoulder reconstruction     x3  . Av fistula placement 12/24/2010  . Hand surgery     right hand  Hern Discectomy x 2 last year   ROS: [x]  Positive  [ ]  Denies    General: [ ]  Weight loss, [ ]  Fever, [ ]  chills Neurologic: [ ]  Dizziness, [ ]  Blackouts, [ ]  Seizure [ ]  Stroke, [ ]  "Mini stroke", [ ]  Slurred speech, [ ]  Temporary blindness; [ ]  weakness in arms or legs, [ ]  Hoarseness Cardiac: [ ]  Chest pain/pressure, [ ]  Shortness of breath at rest [ ]  Shortness of breath with exertion, [ ]  Atrial fibrillation or irregular heartbeat Vascular: [ ]  Pain in legs with walking, [ ]  Pain in legs at rest, [ ]  Pain in legs at night,  [ ]  Non-healing ulcer, [ ]  Blood clot in vein/DVT,   Pulmonary: [ ]  Home oxygen, [ ]  Productive cough, [ ]  Coughing up blood, [ ]  Asthma,  [ ]  Wheezing Musculoskeletal:  [ ]  Arthritis, [ ]  Low back pain, [ ]  Joint pain Hematologic: [ ]  Easy Bruising, [ ]  Anemia; [ ]  Hepatitis Gastrointestinal: [ ]  Blood in  stool, [ ]  Gastroesophageal Reflux/heartburn, [ ]  Trouble swallowing Urinary: [x ] chronic Kidney disease, [x ] on HD - [ ]  MWF or [x ] TTHS, [ ]  Burning with urination, [ ]  Difficulty urinating Skin: [ ]  Rashes, [ ]  Wounds Psychological: [ ]  Anxiety, [ ]  Depression  Social History History  Substance Use Topics  . Smoking status: Never Smoker   . Smokeless tobacco: Never Used  . Alcohol Use: No    Family History Family History  Problem Relation Age of Onset  . Adopted: Yes    Allergies  Allergen Reactions  . Calcium Channel Blockers     Current Outpatient Prescriptions  Medication Sig Dispense Refill  . carvedilol (COREG) 25 MG tablet Take 25 mg by mouth 2 (two) times daily with a meal.        . lisinopril (PRINIVIL,ZESTRIL) 20 MG tablet Take 20 mg by mouth 2 (two) times daily.       . Polysaccharide Iron Complex (POLY-IRON 150 PO) Take 150 mg by mouth daily.        . pravastatin (PRAVACHOL) 20 MG tablet Take 20 mg by mouth daily.           Imaging: No results found.  Significant Diagnostic Studies: CBC    Component Value Date/Time   WBC  13.7* 07/07/2011 0630   RBC 4.58 07/07/2011 0630   HGB 14.1 07/07/2011 0630   HCT 41.2 07/07/2011 0630   PLT 176 07/07/2011 0630   MCV 90.0 07/07/2011 0630   MCH 30.8 07/07/2011 0630   MCHC 34.2 07/07/2011 0630   RDW 14.1 07/07/2011 0630   LYMPHSABS 2.2 12/16/2009 0550   MONOABS 1.2* 12/16/2009 0550   EOSABS 0.6 12/16/2009 0550   BASOSABS 0.0 12/16/2009 0550    BMET    Component Value Date/Time   NA 135 07/07/2011 0630   K 4.0 07/07/2011 0630   CL 94* 07/07/2011 0630   CO2 25 07/07/2011 0630   GLUCOSE 95 07/07/2011 0630   BUN 49* 07/07/2011 0630   CREATININE 7.20* 07/07/2011 0630   CREATININE 8.41* 12/08/2009 1400   CALCIUM 9.4 07/07/2011 0630   CALCIUM 8.2* 12/11/2009 0507   GFRNONAA 8* 07/07/2011 0630   GFRAA 10* 07/07/2011 0630    COAG Lab Results  Component Value Date   INR 0.99 07/07/2011   INR 0.95 12/05/2009   No results found  for this basename: PTT     Physical Examination  @IPVITALS @ Pulse Readings from Last 3 Encounters:  10/27/11 88    General:  WDWN in NAD Gait: Normal HENT: WNL Eyes: Pupils equal Pulmonary: normal non-labored breathing , without Rales, rhonchi,  wheezing Cardiac: RRR, without  Murmurs, rubs or gallops;Abdomen: soft, NT, no masses Skin: no rashes, ulcers noted Vascular Exam/Pulses:2+ radial pulse BUE good thrill and bruit in RUE AVF> LUE: large pseudoaneurysm left arm fistula which is still functioning. The size reduces significantly with occlusion of fistula near anastomotic site Extremities without ischemic changes, no Gangrene , no cellulitis; no open wounds;  Musculoskeletal: no muscle wasting or atrophy  Neurologic: A&O X 3; Appropriate Affect ;  SENSATION: normal; MOTOR FUNCTION:  moving all extremities equally.  Speech is fluent/normal   ASSESSMENT: Enlarging pseudoaneurysm left Brachiocephalic AVF PLAN: Ligation and possible resection of the left arm Brachiocephalic AVF Surgery scheduled  Joedy Eickhoff J 10/27/2011 2:08 PM

## 2011-10-27 NOTE — Progress Notes (Signed)
[facility] [place] History and Physical 10/27/2011 [id number]  [id number] left arm Pseudoaneurysm, Brachiocephalic AVF Referring Physician:Dr. Lorrene Reid , HD T TH S  History of Present Illness: Billy [name] is a 45 y.o. male who had a left Brachiocephalic AVF placed by Dr. Kellie Simmering in April of 2011. This had been functioning well but developed a large pseudoaneurysm and a new B-C AVF was placed in the right arm which is functioning well. Pt returned to work and has been doing heavy lifting. The pseudoaneurysm has been progressively getting bigger over the past few months and he states it has become somewhat uncomfortable an d he has trouble bending his arm. He would like this ligated and possibly have the pseudoaneurysm removed.  Past Medical History  Diagnosis Date  . Hypertension   . Hyperlipidemia   . Anxiety   . Anemia   . Joint pain   . Chronic kidney disease     Past Surgical History  Procedure Date  . Open anterior shoulder reconstruction     x3  . Av fistula placement 12/24/2010  . Hand surgery     right hand  Hern Discectomy x 2 last year   ROS: [x]  Positive  [ ]  Denies    General: [ ]  Weight loss, [ ]  Fever, [ ]  chills Neurologic: [ ]  Dizziness, [ ]  Blackouts, [ ]  Seizure [ ]  Stroke, [ ]  "Mini stroke", [ ]  Slurred speech, [ ]  Temporary blindness; [ ]  weakness in arms or legs, [ ]  Hoarseness Cardiac: [ ]  Chest pain/pressure, [ ]  Shortness of breath at rest [ ]  Shortness of breath with exertion, [ ]  Atrial fibrillation or irregular heartbeat Vascular: [ ]  Pain in legs with walking, [ ]  Pain in legs at rest, [ ]  Pain in legs at night,  [ ]  Non-healing ulcer, [ ]  Blood clot in vein/DVT,   Pulmonary: [ ]  Home oxygen, [ ]  Productive cough, [ ]  Coughing up blood, [ ]  Asthma,  [ ]  Wheezing Musculoskeletal:  [ ]  Arthritis, [ ]  Low back pain, [ ]  Joint pain Hematologic: [ ]  Easy Bruising, [ ]  Anemia; [ ]  Hepatitis Gastrointestinal: [ ]  Blood in  stool, [ ]  Gastroesophageal Reflux/heartburn, [ ]  Trouble swallowing Urinary: [x ] chronic Kidney disease, [x ] on HD - [ ]  MWF or [x ] TTHS, [ ]  Burning with urination, [ ]  Difficulty urinating Skin: [ ]  Rashes, [ ]  Wounds Psychological: [ ]  Anxiety, [ ]  Depression  Social History History  Substance Use Topics  . Smoking status: Never Smoker   . Smokeless tobacco: Never Used  . Alcohol Use: No    Family History Family History  Problem Relation Age of Onset  . Adopted: Yes    Allergies  Allergen Reactions  . Calcium Channel Blockers     Current Outpatient Prescriptions  Medication Sig Dispense Refill  . carvedilol (COREG) 25 MG tablet Take 25 mg by mouth 2 (two) times daily with a meal.        . lisinopril (PRINIVIL,ZESTRIL) 20 MG tablet Take 20 mg by mouth 2 (two) times daily.       . Polysaccharide Iron Complex (POLY-IRON 150 PO) Take 150 mg by mouth daily.        . pravastatin (PRAVACHOL) 20 MG tablet Take 20 mg by mouth daily.           Imaging: No results found.  Significant Diagnostic Studies: CBC    Component Value Date/Time   WBC  13.7* 07/07/2011 0630   RBC 4.58 07/07/2011 0630   HGB 14.1 07/07/2011 0630   HCT 41.2 07/07/2011 0630   PLT 176 07/07/2011 0630   MCV 90.0 07/07/2011 0630   MCH 30.8 07/07/2011 0630   MCHC 34.2 07/07/2011 0630   RDW 14.1 07/07/2011 0630   LYMPHSABS 2.2 12/16/2009 0550   MONOABS 1.2* 12/16/2009 0550   EOSABS 0.6 12/16/2009 0550   BASOSABS 0.0 12/16/2009 0550    BMET    Component Value Date/Time   NA 135 07/07/2011 0630   K 4.0 07/07/2011 0630   CL 94* 07/07/2011 0630   CO2 25 07/07/2011 0630   GLUCOSE 95 07/07/2011 0630   BUN 49* 07/07/2011 0630   CREATININE 7.20* 07/07/2011 0630   CREATININE 8.41* 12/08/2009 1400   CALCIUM 9.4 07/07/2011 0630   CALCIUM 8.2* 12/11/2009 0507   GFRNONAA 8* 07/07/2011 0630   GFRAA 10* 07/07/2011 0630    COAG Lab Results  Component Value Date   INR 0.99 07/07/2011   INR 0.95 12/05/2009   No results found  for this basename: PTT     Physical Examination  @IPVITALS @ Pulse Readings from Last 3 Encounters:  10/27/11 88    General:  WDWN in NAD Gait: Normal HENT: WNL Eyes: Pupils equal Pulmonary: normal non-labored breathing , without Rales, rhonchi,  wheezing Cardiac: RRR, without  Murmurs, rubs or gallops;Abdomen: soft, NT, no masses Skin: no rashes, ulcers noted Vascular Exam/Pulses:2+ radial pulse BUE good thrill and bruit in RUE AVF> LUE: large pseudoaneurysm left arm fistula which is still functioning. The size reduces significantly with occlusion of fistula near anastomotic site Extremities without ischemic changes, no Gangrene , no cellulitis; no open wounds;  Musculoskeletal: no muscle wasting or atrophy  Neurologic: A&O X 3; Appropriate Affect ;  SENSATION: normal; MOTOR FUNCTION:  moving all extremities equally.  Speech is fluent/normal   ASSESSMENT: Enlarging pseudoaneurysm left Brachiocephalic AVF PLAN: Ligation and possible resection of the left arm Brachiocephalic AVF Surgery scheduled  Evony Rezek J 10/27/2011 2:08 PM

## 2011-10-28 DIAGNOSIS — N2581 Secondary hyperparathyroidism of renal origin: Secondary | ICD-10-CM | POA: Diagnosis not present

## 2011-10-28 DIAGNOSIS — D509 Iron deficiency anemia, unspecified: Secondary | ICD-10-CM | POA: Diagnosis not present

## 2011-10-28 DIAGNOSIS — N186 End stage renal disease: Secondary | ICD-10-CM | POA: Diagnosis not present

## 2011-11-01 DIAGNOSIS — M545 Low back pain, unspecified: Secondary | ICD-10-CM | POA: Diagnosis not present

## 2011-11-01 DIAGNOSIS — F988 Other specified behavioral and emotional disorders with onset usually occurring in childhood and adolescence: Secondary | ICD-10-CM | POA: Diagnosis not present

## 2011-11-02 ENCOUNTER — Encounter (HOSPITAL_COMMUNITY): Payer: Self-pay | Admitting: *Deleted

## 2011-11-02 MED ORDER — SODIUM CHLORIDE 0.9 % IV SOLN
INTRAVENOUS | Status: DC
  Administered 2011-11-03: 50 mL/h via INTRAVENOUS

## 2011-11-02 MED ORDER — CEFAZOLIN SODIUM-DEXTROSE 2-3 GM-% IV SOLR
2.0000 g | INTRAVENOUS | Status: AC
  Administered 2011-11-03: 2 g via INTRAVENOUS
  Filled 2011-11-02: qty 50

## 2011-11-02 NOTE — Consult Note (Signed)
Anesthesia:  Patient is a 46 year old male scheduled for ligation and possible resection of a LUE brachiocephalic AVF on [id number].  He will be a same day work-up.  History includes ESRD with HD TTS, HTN, HLD, anxiety, anemia, non-smoker.  CXR on 12/24/10 showed no active disease.  Last EKG at [facility] was from 12/10/09.  It showed NSR, possible LAE, LAD, LVH with repolarization abnormality (lateral T wave inversion).  He is suppose to get an EKG on the day of surgery since the last one available is greater than 89 year old.    He had negative dobutamine stress echo at [facility] on 04/09/10 with EF 69%.  Echo done at [facility] on 12/08/09 (see Results Review tab) showed: - Left ventricle: The cavity size was mildly dilated. Wall thickness was increased in a pattern of severe LVH. Systolic function was normal. The estimated ejection fraction was in the range of 50% to 55%. Diffuse hypokinesis. - Mitral valve: Mild regurgitation. - Left atrium: The atrium was mildly dilated. - Atrial septum: No defect or patent foramen ovale was identified.  He is also for labs on the day of surgery.

## 2011-11-02 NOTE — Progress Notes (Signed)
Stress/ECHO records obtained from [facility] and chart given to anesthesia for review.

## 2011-11-03 ENCOUNTER — Ambulatory Visit (HOSPITAL_COMMUNITY): Payer: Medicare Other | Admitting: Vascular Surgery

## 2011-11-03 ENCOUNTER — Encounter (HOSPITAL_COMMUNITY): Payer: Self-pay | Admitting: Vascular Surgery

## 2011-11-03 ENCOUNTER — Encounter (HOSPITAL_COMMUNITY): Payer: Self-pay | Admitting: *Deleted

## 2011-11-03 ENCOUNTER — Encounter (HOSPITAL_COMMUNITY)
Admission: RE | Disposition: A | Discharge status: Home / Self Care | Payer: Self-pay | Source: Ambulatory Visit | Attending: Vascular Surgery

## 2011-11-03 ENCOUNTER — Other Ambulatory Visit: Payer: Self-pay

## 2011-11-03 ENCOUNTER — Ambulatory Visit (HOSPITAL_COMMUNITY)
Admission: RE | Admit: 2011-11-03 | Discharge: 2011-11-03 | Disposition: A | Discharge status: Home / Self Care | Payer: Medicare Other | Source: Ambulatory Visit | Attending: Vascular Surgery | Admitting: Vascular Surgery

## 2011-11-03 ENCOUNTER — Other Ambulatory Visit: Payer: Self-pay | Admitting: Vascular Surgery

## 2011-11-03 DIAGNOSIS — N186 End stage renal disease: Secondary | ICD-10-CM | POA: Insufficient documentation

## 2011-11-03 DIAGNOSIS — I120 Hypertensive chronic kidney disease with stage 5 chronic kidney disease or end stage renal disease: Secondary | ICD-10-CM | POA: Insufficient documentation

## 2011-11-03 DIAGNOSIS — I729 Aneurysm of unspecified site: Secondary | ICD-10-CM | POA: Diagnosis not present

## 2011-11-03 DIAGNOSIS — I770 Arteriovenous fistula, acquired: Secondary | ICD-10-CM | POA: Diagnosis not present

## 2011-11-03 DIAGNOSIS — F411 Generalized anxiety disorder: Secondary | ICD-10-CM | POA: Insufficient documentation

## 2011-11-03 DIAGNOSIS — I10 Essential (primary) hypertension: Secondary | ICD-10-CM | POA: Diagnosis not present

## 2011-11-03 DIAGNOSIS — E785 Hyperlipidemia, unspecified: Secondary | ICD-10-CM | POA: Insufficient documentation

## 2011-11-03 DIAGNOSIS — M2550 Pain in unspecified joint: Secondary | ICD-10-CM | POA: Diagnosis not present

## 2011-11-03 DIAGNOSIS — T82898A Other specified complication of vascular prosthetic devices, implants and grafts, initial encounter: Secondary | ICD-10-CM | POA: Diagnosis not present

## 2011-11-03 DIAGNOSIS — Z79899 Other long term (current) drug therapy: Secondary | ICD-10-CM | POA: Diagnosis not present

## 2011-11-03 DIAGNOSIS — I721 Aneurysm of artery of upper extremity: Secondary | ICD-10-CM | POA: Insufficient documentation

## 2011-11-03 DIAGNOSIS — N189 Chronic kidney disease, unspecified: Secondary | ICD-10-CM | POA: Diagnosis not present

## 2011-11-03 HISTORY — DX: Dependence on renal dialysis: Z99.2

## 2011-11-03 LAB — SURGICAL PCR SCREEN: Staphylococcus aureus: NEGATIVE

## 2011-11-03 LAB — POCT I-STAT 4, (NA,K, GLUC, HGB,HCT): Potassium: 5 mEq/L (ref 3.5–5.1)

## 2011-11-03 SURGERY — RESECTION OF ARTERIOVENOUS FISTULA ANEURYSM
Anesthesia: General | Site: Arm Upper | Laterality: Left | Wound class: Clean

## 2011-11-03 MED ORDER — SODIUM CHLORIDE 0.9 % IV SOLN
INTRAVENOUS | Status: DC | PRN
  Administered 2011-11-03 (×2): via INTRAVENOUS

## 2011-11-03 MED ORDER — EPHEDRINE SULFATE 50 MG/ML IJ SOLN
INTRAMUSCULAR | Status: DC | PRN
  Administered 2011-11-03: 10 mg via INTRAVENOUS
  Administered 2011-11-03 (×2): 25 mg via INTRAVENOUS

## 2011-11-03 MED ORDER — ONDANSETRON HCL 4 MG/2ML IJ SOLN: 4.0000 mg | Freq: Once | INTRAMUSCULAR | Status: DC | PRN

## 2011-11-03 MED ORDER — MEPERIDINE HCL 25 MG/ML IJ SOLN: 6.2500 mg | INTRAMUSCULAR | Status: DC | PRN

## 2011-11-03 MED ORDER — OXYCODONE HCL 5 MG PO TABS: ORAL_TABLET | ORAL | Status: AC

## 2011-11-03 MED ORDER — PROPOFOL 10 MG/ML IV EMUL
INTRAVENOUS | Status: DC | PRN
  Administered 2011-11-03: 300 mg via INTRAVENOUS

## 2011-11-03 MED ORDER — MORPHINE SULFATE 2 MG/ML IJ SOLN: 0.0500 mg/kg | INTRAMUSCULAR | Status: DC | PRN

## 2011-11-03 MED ORDER — MIDAZOLAM HCL 5 MG/5ML IJ SOLN
INTRAMUSCULAR | Status: DC | PRN
  Administered 2011-11-03: 2 mg via INTRAVENOUS

## 2011-11-03 MED ORDER — SODIUM CHLORIDE 0.9 % IR SOLN
Status: DC | PRN
  Administered 2011-11-03: 12:00:00

## 2011-11-03 MED ORDER — MUPIROCIN 2 % EX OINT
TOPICAL_OINTMENT | CUTANEOUS | Status: AC
  Filled 2011-11-03: qty 22

## 2011-11-03 MED ORDER — MORPHINE SULFATE 4 MG/ML IJ SOLN: 0.0500 mg/kg | INTRAMUSCULAR | Status: DC | PRN

## 2011-11-03 MED ORDER — HYDROMORPHONE HCL PF 1 MG/ML IJ SOLN
0.2500 mg | INTRAMUSCULAR | Status: DC | PRN
  Administered 2011-11-03 (×3): 0.5 mg via INTRAVENOUS

## 2011-11-03 MED ORDER — PHENYLEPHRINE HCL 10 MG/ML IJ SOLN
INTRAMUSCULAR | Status: DC | PRN
  Administered 2011-11-03 (×2): 240 ug via INTRAVENOUS
  Administered 2011-11-03: 160 ug via INTRAVENOUS
  Administered 2011-11-03: 240 ug via INTRAVENOUS
  Administered 2011-11-03: 160 ug via INTRAVENOUS

## 2011-11-03 MED ORDER — MUPIROCIN 2 % EX OINT
TOPICAL_OINTMENT | Freq: Two times a day (BID) | CUTANEOUS | Status: DC
Start: 2011-11-03 — End: 2011-11-03
  Administered 2011-11-03: 1 via NASAL

## 2011-11-03 MED ORDER — LIDOCAINE-EPINEPHRINE (PF) 1 %-1:200000 IJ SOLN
INTRAMUSCULAR | Status: DC | PRN
  Administered 2011-11-03: 9 mL

## 2011-11-03 MED ORDER — FENTANYL CITRATE 0.05 MG/ML IJ SOLN
INTRAMUSCULAR | Status: DC | PRN
  Administered 2011-11-03: 100 ug via INTRAVENOUS

## 2011-11-03 MED ORDER — SODIUM CHLORIDE 0.9 % IR SOLN
Status: DC | PRN
  Administered 2011-11-03: 1

## 2011-11-03 SURGICAL SUPPLY — 45 items
ADH SKN CLS APL DERMABOND .7 (GAUZE/BANDAGES/DRESSINGS) ×1
APL SKNCLS STERI-STRIP NONHPOA (GAUZE/BANDAGES/DRESSINGS) ×1
BANDAGE GAUZE ELAST BULKY 4 IN (GAUZE/BANDAGES/DRESSINGS) ×1 IMPLANT
BENZOIN TINCTURE PRP APPL 2/3 (GAUZE/BANDAGES/DRESSINGS) ×1 IMPLANT
CANISTER SUCTION 2500CC (MISCELLANEOUS) ×2 IMPLANT
CLIP TI MEDIUM 6 (CLIP) ×2 IMPLANT
CLIP TI WIDE RED SMALL 6 (CLIP) ×2 IMPLANT
CLOTH BEACON ORANGE TIMEOUT ST (SAFETY) ×2 IMPLANT
COVER PROBE W GEL 5X96 (DRAPES) IMPLANT
COVER SURGICAL LIGHT HANDLE (MISCELLANEOUS) ×4 IMPLANT
DERMABOND ADVANCED (GAUZE/BANDAGES/DRESSINGS) ×1
DERMABOND ADVANCED .7 DNX12 (GAUZE/BANDAGES/DRESSINGS) ×1 IMPLANT
ELECT REM PT RETURN 9FT ADLT (ELECTROSURGICAL) ×2
ELECTRODE REM PT RTRN 9FT ADLT (ELECTROSURGICAL) ×1 IMPLANT
GAUZE SPONGE 4X4 12PLY STRL LF (GAUZE/BANDAGES/DRESSINGS) ×1 IMPLANT
GEL ULTRASOUND 20GR AQUASONIC (MISCELLANEOUS) IMPLANT
GLOVE BIO SURGEON STRL SZ 6.5 (GLOVE) ×1 IMPLANT
GLOVE BIOGEL PI IND STRL 6.5 (GLOVE) IMPLANT
GLOVE BIOGEL PI IND STRL 7.0 (GLOVE) IMPLANT
GLOVE BIOGEL PI IND STRL 7.5 (GLOVE) IMPLANT
GLOVE BIOGEL PI INDICATOR 6.5 (GLOVE) ×2
GLOVE BIOGEL PI INDICATOR 7.0 (GLOVE) ×2
GLOVE BIOGEL PI INDICATOR 7.5 (GLOVE) ×1
GLOVE SS BIOGEL STRL SZ 7 (GLOVE) ×1 IMPLANT
GLOVE SUPERSENSE BIOGEL SZ 7 (GLOVE) ×1
GLOVE SURG SS PI 7.5 STRL IVOR (GLOVE) ×1 IMPLANT
GOWN PREVENTION PLUS XLARGE (GOWN DISPOSABLE) ×1 IMPLANT
GOWN STRL NON-REIN LRG LVL3 (GOWN DISPOSABLE) ×5 IMPLANT
KIT BASIN OR (CUSTOM PROCEDURE TRAY) ×2 IMPLANT
KIT ROOM TURNOVER OR (KITS) ×2 IMPLANT
NS IRRIG 1000ML POUR BTL (IV SOLUTION) ×2 IMPLANT
PACK CV ACCESS (CUSTOM PROCEDURE TRAY) ×2 IMPLANT
PAD ARMBOARD 7.5X6 YLW CONV (MISCELLANEOUS) ×4 IMPLANT
SPECIMEN JAR MEDIUM (MISCELLANEOUS) ×1 IMPLANT
SPONGE GAUZE 4X4 12PLY (GAUZE/BANDAGES/DRESSINGS) ×2 IMPLANT
STRIP CLOSURE SKIN 1/2X4 (GAUZE/BANDAGES/DRESSINGS) ×1 IMPLANT
SUT PROLENE 5 0 C 1 24 (SUTURE) ×1 IMPLANT
SUT PROLENE 6 0 BV (SUTURE) ×2 IMPLANT
SUT VIC AB 3-0 SH 27 (SUTURE) ×4
SUT VIC AB 3-0 SH 27X BRD (SUTURE) ×1 IMPLANT
TAPE CLOTH SURG 4X10 WHT LF (GAUZE/BANDAGES/DRESSINGS) ×1 IMPLANT
TOWEL OR 17X24 6PK STRL BLUE (TOWEL DISPOSABLE) ×2 IMPLANT
TOWEL OR 17X26 10 PK STRL BLUE (TOWEL DISPOSABLE) ×2 IMPLANT
UNDERPAD 30X30 INCONTINENT (UNDERPADS AND DIAPERS) ×2 IMPLANT
WATER STERILE IRR 1000ML POUR (IV SOLUTION) ×2 IMPLANT

## 2011-11-03 NOTE — Anesthesia Procedure Notes (Addendum)
Performed by: Neldon Newport   Procedure Name: LMA Insertion Date/Time: 11/03/2011 11:28 AM Performed by: Neldon Newport Pre-anesthesia Checklist: Patient identified, Timeout performed, Emergency Drugs available, Suction available and Patient being monitored Patient Re-evaluated:Patient Re-evaluated prior to inductionOxygen Delivery Method: Circle System Utilized Preoxygenation: Pre-oxygenation with 100% oxygen Intubation Type: IV induction Ventilation: Mask ventilation without difficulty LMA: LMA inserted LMA Size: 5.0 Tube type: Oral Number of attempts: 1 Placement Confirmation: positive ETCO2 and breath sounds checked- equal and bilateral Tube secured with: Tape Dental Injury: Teeth and Oropharynx as per pre-operative assessment

## 2011-11-03 NOTE — Progress Notes (Signed)
Called Patient's spouse's cell listed under contacts to try to reach him. Left message for them to call us.

## 2011-11-03 NOTE — H&P (View-Only) (Signed)
[facility] [place] History and Physical 10/27/2011 [id number]  [id number] left arm Pseudoaneurysm, Brachiocephalic AVF Referring Physician:Dr. Lorrene Reid , HD T TH S  History of Present Illness: Billy [name] is a 46 y.o. male who had a left Brachiocephalic AVF placed by Dr. Kellie Simmering in April of 2011. This had been functioning well but developed a large pseudoaneurysm and a new B-C AVF was placed in the right arm which is functioning well. Pt returned to work and has been doing heavy lifting. The pseudoaneurysm has been progressively getting bigger over the past few months and he states it has become somewhat uncomfortable an d he has trouble bending his arm. He would like this ligated and possibly have the pseudoaneurysm removed.  Past Medical History  Diagnosis Date  . Hypertension   . Hyperlipidemia   . Anxiety   . Anemia   . Joint pain   . Chronic kidney disease     Past Surgical History  Procedure Date  . Open anterior shoulder reconstruction     x3  . Av fistula placement 12/24/2010  . Hand surgery     right hand  Hern Discectomy x 2 last year   ROS: [x]  Positive  [ ]  Denies    General: [ ]  Weight loss, [ ]  Fever, [ ]  chills Neurologic: [ ]  Dizziness, [ ]  Blackouts, [ ]  Seizure [ ]  Stroke, [ ]  "Mini stroke", [ ]  Slurred speech, [ ]  Temporary blindness; [ ]  weakness in arms or legs, [ ]  Hoarseness Cardiac: [ ]  Chest pain/pressure, [ ]  Shortness of breath at rest [ ]  Shortness of breath with exertion, [ ]  Atrial fibrillation or irregular heartbeat Vascular: [ ]  Pain in legs with walking, [ ]  Pain in legs at rest, [ ]  Pain in legs at night,  [ ]  Non-healing ulcer, [ ]  Blood clot in vein/DVT,   Pulmonary: [ ]  Home oxygen, [ ]  Productive cough, [ ]  Coughing up blood, [ ]  Asthma,  [ ]  Wheezing Musculoskeletal:  [ ]  Arthritis, [ ]  Low back pain, [ ]  Joint pain Hematologic: [ ]  Easy Bruising, [ ]  Anemia; [ ]  Hepatitis Gastrointestinal: [ ]  Blood in  stool, [ ]  Gastroesophageal Reflux/heartburn, [ ]  Trouble swallowing Urinary: [x ] chronic Kidney disease, [x ] on HD - [ ]  MWF or [x ] TTHS, [ ]  Burning with urination, [ ]  Difficulty urinating Skin: [ ]  Rashes, [ ]  Wounds Psychological: [ ]  Anxiety, [ ]  Depression  Social History History  Substance Use Topics  . Smoking status: Never Smoker   . Smokeless tobacco: Never Used  . Alcohol Use: No    Family History Family History  Problem Relation Age of Onset  . Adopted: Yes    Allergies  Allergen Reactions  . Calcium Channel Blockers     Current Outpatient Prescriptions  Medication Sig Dispense Refill  . carvedilol (COREG) 25 MG tablet Take 25 mg by mouth 2 (two) times daily with a meal.        . lisinopril (PRINIVIL,ZESTRIL) 20 MG tablet Take 20 mg by mouth 2 (two) times daily.       . Polysaccharide Iron Complex (POLY-IRON 150 PO) Take 150 mg by mouth daily.        . pravastatin (PRAVACHOL) 20 MG tablet Take 20 mg by mouth daily.           Imaging: No results found.  Significant Diagnostic Studies: CBC    Component Value Date/Time   WBC  13.7* 07/07/2011 0630   RBC 4.58 07/07/2011 0630   HGB 14.1 07/07/2011 0630   HCT 41.2 07/07/2011 0630   PLT 176 07/07/2011 0630   MCV 90.0 07/07/2011 0630   MCH 30.8 07/07/2011 0630   MCHC 34.2 07/07/2011 0630   RDW 14.1 07/07/2011 0630   LYMPHSABS 2.2 12/16/2009 0550   MONOABS 1.2* 12/16/2009 0550   EOSABS 0.6 12/16/2009 0550   BASOSABS 0.0 12/16/2009 0550    BMET    Component Value Date/Time   NA 135 07/07/2011 0630   K 4.0 07/07/2011 0630   CL 94* 07/07/2011 0630   CO2 25 07/07/2011 0630   GLUCOSE 95 07/07/2011 0630   BUN 49* 07/07/2011 0630   CREATININE 7.20* 07/07/2011 0630   CREATININE 8.41* 12/08/2009 1400   CALCIUM 9.4 07/07/2011 0630   CALCIUM 8.2* 12/11/2009 0507   GFRNONAA 8* 07/07/2011 0630   GFRAA 10* 07/07/2011 0630    COAG Lab Results  Component Value Date   INR 0.99 07/07/2011   INR 0.95 12/05/2009   No results found  for this basename: PTT     Physical Examination  @IPVITALS @ Pulse Readings from Last 3 Encounters:  10/27/11 88    General:  WDWN in NAD Gait: Normal HENT: WNL Eyes: Pupils equal Pulmonary: normal non-labored breathing , without Rales, rhonchi,  wheezing Cardiac: RRR, without  Murmurs, rubs or gallops;Abdomen: soft, NT, no masses Skin: no rashes, ulcers noted Vascular Exam/Pulses:2+ radial pulse BUE good thrill and bruit in RUE AVF> LUE: large pseudoaneurysm left arm fistula which is still functioning. The size reduces significantly with occlusion of fistula near anastomotic site Extremities without ischemic changes, no Gangrene , no cellulitis; no open wounds;  Musculoskeletal: no muscle wasting or atrophy  Neurologic: A&O X 3; Appropriate Affect ;  SENSATION: normal; MOTOR FUNCTION:  moving all extremities equally.  Speech is fluent/normal   ASSESSMENT: Enlarging pseudoaneurysm left Brachiocephalic AVF PLAN: Ligation and possible resection of the left arm Brachiocephalic AVF Surgery scheduled  Gerianne Simonet J 10/27/2011 2:08 PM

## 2011-11-03 NOTE — Transfer of Care (Signed)
Immediate Anesthesia Transfer of Care Note  Patient: Billy [name]  Procedure(s) Performed:  RESECTION OF ARTERIOVENOUS FISTULA ANEURYSM; LIGATION OF ARTERIOVENOUS  FISTULA  Patient Location: PACU  Anesthesia Type: General  Level of Consciousness: alert  and oriented  Airway & Oxygen Therapy: Patient Spontanous Breathing and Patient connected to nasal cannula oxygen  Post-op Assessment: Report given to PACU RN and Post -op Vital signs reviewed and stable  Post vital signs: Reviewed and stable  Complications: No apparent anesthesia complications

## 2011-11-03 NOTE — Op Note (Signed)
OPERATIVE REPORT  DATE OF SURGERY: 11/03/2011  PATIENT: Billy [name], 46 y.o. 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  PHYSICIAN ASSISTANT: Roczniak  ANESTHESIA:  Gen.  EBL: Minimal ml  Total I/O In: 500 [I.V.:500] Out: -   BLOOD ADMINISTERED: None  DRAINS: None  SPECIMEN: Venous aneurysm  COUNTS CORRECT:  YES  PLAN OF CARE: PACU   PATIENT DISPOSITION:  PACU - hemodynamically stable  PROCEDURE DETAILS: Patient segment and placed in the left arm draped in sterile fashion. Using local anesthesia and general anesthesia incision was made over the antecubital space and carried down to isolate the brachial artery to cephalic vein anastomosis. The vein was ligated with 2-0 silk proximal and distally and divided. The patient had a very large venous aneurysm from the antecubital space to the mid upper arm. This was all dissected free. A separate incision was made in the upper arm over the cephalic vein fistula. The vein was ligated proximally and distally with 2-0 silk ties and divided. The venous aneurysm was resected in its entirety and was passed off the field for specimen. Hemostasis was obtained and the resulting tunnel. The wounds were closed with 3-0 Vicryl in the subcutaneous and subcuticular tissue. Pendulous stricture for applied. A Curlex and Ace wrap were placed over the arm and the patient was taken to the recovery room in stable condition. He had a 2-3+ left radial pulse   Curt Jews, M.D. 11/03/2011 12:38 PM

## 2011-11-03 NOTE — Anesthesia Postprocedure Evaluation (Signed)
  Anesthesia Post-op Note  Patient: Billy [name]  Procedure(s) Performed:  RESECTION OF ARTERIOVENOUS FISTULA ANEURYSM; LIGATION OF ARTERIOVENOUS  FISTULA  Patient Location: PACU  Anesthesia Type: General  Level of Consciousness: awake, alert  and oriented  Airway and Oxygen Therapy: Patient Spontanous Breathing and Patient connected to nasal cannula oxygen  Post-op Pain: moderate  Post-op Assessment: Post-op Vital signs reviewed, Patient's Cardiovascular Status Stable, Respiratory Function Stable, Patent Airway, No signs of Nausea or vomiting and Pain level controlled  Post-op Vital Signs: Reviewed and stable  Complications: No apparent anesthesia complications

## 2011-11-03 NOTE — Interval H&P Note (Signed)
History and Physical Interval Note:  11/03/2011 11:12 AM  Billy [name]  has presented today for surgery, with the diagnosis of ESRD;Pseudoany Left AVF  The various methods of treatment have been discussed with the patient and family. After consideration of risks, benefits and other options for treatment, the patient has consented to  Procedure(s): REVISON OF ARTERIOVENOUS FISTULA as a surgical intervention .  The patients' history has been reviewed, patient examined, no change in status, stable for surgery.  I have reviewed the patients' chart and labs.  Questions were answered to the patient's satisfaction.     Rosetta Posner

## 2011-11-03 NOTE — Progress Notes (Signed)
Pt states bp reads very high when cked in legs. Small cuff to right lower arm. Pt states dialysis checks bp in lower rt arm.dw

## 2011-11-03 NOTE — Anesthesia Preprocedure Evaluation (Addendum)
Anesthesia Evaluation  Patient identified by MRN, date of birth, ID band Patient awake    Reviewed: Allergy & Precautions, H&P , NPO status , Patient's Chart, lab work & pertinent test results, reviewed documented beta blocker date and time   Airway Mallampati: I TM Distance: >3 FB Neck ROM: Full    Dental  (+) Teeth Intact and Dental Advisory Given   Pulmonary  clear to auscultation        Cardiovascular Regular Normal    Neuro/Psych    GI/Hepatic   Endo/Other    Renal/GU CRF     Musculoskeletal   Abdominal   Peds  Hematology   Anesthesia Other Findings   Reproductive/Obstetrics                          Anesthesia Physical Anesthesia Plan  ASA: III  Anesthesia Plan: General   Post-op Pain Management:    Induction: Intravenous  Airway Management Planned: LMA  Additional Equipment:   Intra-op Plan:   Post-operative Plan: Extubation in OR  Informed Consent: I have reviewed the patients History and Physical, chart, labs and discussed the procedure including the risks, benefits and alternatives for the proposed anesthesia with the patient or authorized representative who has indicated his/her understanding and acceptance.   Dental advisory given  Plan Discussed with: CRNA, Anesthesiologist and Surgeon  Anesthesia Plan Comments:         Anesthesia Quick Evaluation

## 2011-11-04 ENCOUNTER — Telehealth (HOSPITAL_COMMUNITY): Payer: Self-pay | Admitting: *Deleted

## 2011-11-04 DIAGNOSIS — N186 End stage renal disease: Secondary | ICD-10-CM | POA: Diagnosis not present

## 2011-11-13 DIAGNOSIS — N2581 Secondary hyperparathyroidism of renal origin: Secondary | ICD-10-CM | POA: Diagnosis not present

## 2011-11-13 DIAGNOSIS — N186 End stage renal disease: Secondary | ICD-10-CM | POA: Diagnosis not present

## 2011-11-15 ENCOUNTER — Encounter: Payer: Self-pay | Admitting: Vascular Surgery

## 2011-11-16 ENCOUNTER — Ambulatory Visit: Payer: Medicare Other | Admitting: Vascular Surgery

## 2011-11-23 DIAGNOSIS — N2581 Secondary hyperparathyroidism of renal origin: Secondary | ICD-10-CM | POA: Diagnosis not present

## 2011-11-23 DIAGNOSIS — N186 End stage renal disease: Secondary | ICD-10-CM | POA: Diagnosis not present

## 2011-12-15 DIAGNOSIS — M25819 Other specified joint disorders, unspecified shoulder: Secondary | ICD-10-CM | POA: Diagnosis not present

## 2011-12-21 DIAGNOSIS — R509 Fever, unspecified: Secondary | ICD-10-CM | POA: Diagnosis not present

## 2011-12-21 DIAGNOSIS — N2581 Secondary hyperparathyroidism of renal origin: Secondary | ICD-10-CM | POA: Diagnosis not present

## 2011-12-21 DIAGNOSIS — Z5181 Encounter for therapeutic drug level monitoring: Secondary | ICD-10-CM | POA: Diagnosis not present

## 2011-12-21 DIAGNOSIS — R350 Frequency of micturition: Secondary | ICD-10-CM | POA: Diagnosis not present

## 2011-12-21 DIAGNOSIS — Z7682 Awaiting organ transplant status: Secondary | ICD-10-CM | POA: Diagnosis not present

## 2011-12-21 DIAGNOSIS — N186 End stage renal disease: Secondary | ICD-10-CM | POA: Diagnosis not present

## 2011-12-21 DIAGNOSIS — D509 Iron deficiency anemia, unspecified: Secondary | ICD-10-CM | POA: Diagnosis not present

## 2011-12-30 DIAGNOSIS — M545 Low back pain, unspecified: Secondary | ICD-10-CM | POA: Diagnosis not present

## 2011-12-30 DIAGNOSIS — F411 Generalized anxiety disorder: Secondary | ICD-10-CM | POA: Diagnosis not present

## 2011-12-30 DIAGNOSIS — F988 Other specified behavioral and emotional disorders with onset usually occurring in childhood and adolescence: Secondary | ICD-10-CM | POA: Diagnosis not present

## 2011-12-31 ENCOUNTER — Other Ambulatory Visit: Payer: Self-pay

## 2011-12-31 ENCOUNTER — Emergency Department (HOSPITAL_COMMUNITY)
Admission: EM | Admit: 2011-12-31 | Discharge: 2011-12-31 | Disposition: A | Discharge status: Home / Self Care | Payer: Medicare Other | Attending: Emergency Medicine | Admitting: Emergency Medicine

## 2011-12-31 ENCOUNTER — Emergency Department (HOSPITAL_COMMUNITY): Payer: Medicare Other

## 2011-12-31 ENCOUNTER — Encounter (HOSPITAL_COMMUNITY): Payer: Self-pay | Admitting: *Deleted

## 2011-12-31 DIAGNOSIS — R10819 Abdominal tenderness, unspecified site: Secondary | ICD-10-CM | POA: Insufficient documentation

## 2011-12-31 DIAGNOSIS — K5900 Constipation, unspecified: Secondary | ICD-10-CM | POA: Insufficient documentation

## 2011-12-31 DIAGNOSIS — F411 Generalized anxiety disorder: Secondary | ICD-10-CM | POA: Diagnosis not present

## 2011-12-31 DIAGNOSIS — I129 Hypertensive chronic kidney disease with stage 1 through stage 4 chronic kidney disease, or unspecified chronic kidney disease: Secondary | ICD-10-CM | POA: Insufficient documentation

## 2011-12-31 DIAGNOSIS — R112 Nausea with vomiting, unspecified: Secondary | ICD-10-CM | POA: Insufficient documentation

## 2011-12-31 DIAGNOSIS — E785 Hyperlipidemia, unspecified: Secondary | ICD-10-CM | POA: Insufficient documentation

## 2011-12-31 DIAGNOSIS — R109 Unspecified abdominal pain: Secondary | ICD-10-CM | POA: Insufficient documentation

## 2011-12-31 DIAGNOSIS — Z992 Dependence on renal dialysis: Secondary | ICD-10-CM | POA: Diagnosis not present

## 2011-12-31 DIAGNOSIS — Z79899 Other long term (current) drug therapy: Secondary | ICD-10-CM | POA: Diagnosis not present

## 2011-12-31 DIAGNOSIS — K4090 Unilateral inguinal hernia, without obstruction or gangrene, not specified as recurrent: Secondary | ICD-10-CM | POA: Diagnosis not present

## 2011-12-31 DIAGNOSIS — N189 Chronic kidney disease, unspecified: Secondary | ICD-10-CM | POA: Insufficient documentation

## 2011-12-31 DIAGNOSIS — K5730 Diverticulosis of large intestine without perforation or abscess without bleeding: Secondary | ICD-10-CM | POA: Diagnosis not present

## 2011-12-31 LAB — CBC
HCT: 43.6 % (ref 39.0–52.0)
MCV: 88.4 fL (ref 78.0–100.0)
RBC: 4.93 MIL/uL (ref 4.22–5.81)
RDW: 13.4 % (ref 11.5–15.5)
WBC: 9.9 10*3/uL (ref 4.0–10.5)

## 2011-12-31 LAB — COMPREHENSIVE METABOLIC PANEL
AST: 23 U/L (ref 0–37)
Albumin: 4.6 g/dL (ref 3.5–5.2)
Chloride: 102 mEq/L (ref 96–112)
Creatinine, Ser: 7.73 mg/dL — ABNORMAL HIGH (ref 0.50–1.35)
Potassium: 5.6 mEq/L — ABNORMAL HIGH (ref 3.5–5.1)
Total Bilirubin: 0.3 mg/dL (ref 0.3–1.2)
Total Protein: 8.2 g/dL (ref 6.0–8.3)

## 2011-12-31 LAB — DIFFERENTIAL
Eosinophils Relative: 4 % (ref 0–5)
Lymphocytes Relative: 29 % (ref 12–46)
Lymphs Abs: 2.8 10*3/uL (ref 0.7–4.0)
Monocytes Absolute: 0.8 10*3/uL (ref 0.1–1.0)

## 2011-12-31 LAB — URINE MICROSCOPIC-ADD ON

## 2011-12-31 LAB — URINALYSIS, ROUTINE W REFLEX MICROSCOPIC
Bilirubin Urine: NEGATIVE
Glucose, UA: NEGATIVE mg/dL
Ketones, ur: NEGATIVE mg/dL
pH: 6 (ref 5.0–8.0)

## 2011-12-31 LAB — POCT I-STAT, CHEM 8
BUN: 60 mg/dL — ABNORMAL HIGH (ref 6–23)
Calcium, Ion: 1.3 mmol/L (ref 1.12–1.32)
Chloride: 113 mEq/L — ABNORMAL HIGH (ref 96–112)
Glucose, Bld: 78 mg/dL (ref 70–99)

## 2011-12-31 MED ORDER — HYDROMORPHONE HCL PF 1 MG/ML IJ SOLN
1.0000 mg | Freq: Once | INTRAMUSCULAR | Status: AC
  Administered 2011-12-31: 1 mg via INTRAVENOUS
  Filled 2011-12-31: qty 1

## 2011-12-31 MED ORDER — FENTANYL CITRATE 0.05 MG/ML IJ SOLN
50.0000 ug | Freq: Once | INTRAMUSCULAR | Status: AC
  Administered 2011-12-31: 50 ug via INTRAVENOUS
  Filled 2011-12-31: qty 2

## 2011-12-31 MED ORDER — ONDANSETRON HCL 4 MG/2ML IJ SOLN
4.0000 mg | Freq: Once | INTRAMUSCULAR | Status: AC
  Administered 2011-12-31: 4 mg via INTRAVENOUS
  Filled 2011-12-31: qty 2

## 2011-12-31 MED ORDER — IOHEXOL 300 MG/ML  SOLN
20.0000 mL | INTRAMUSCULAR | Status: AC
  Administered 2011-12-31 (×2): 20 mL via ORAL

## 2011-12-31 MED ORDER — ONDANSETRON HCL 4 MG PO TABS: 4.0000 mg | ORAL_TABLET | Freq: Four times a day (QID) | ORAL | Status: AC

## 2011-12-31 MED ORDER — SODIUM POLYSTYRENE SULFONATE 15 GM/60ML PO SUSP
30.0000 g | Freq: Once | ORAL | Status: AC
  Administered 2011-12-31: 30 g via ORAL
  Filled 2011-12-31: qty 120

## 2011-12-31 MED ORDER — HYDROCODONE-ACETAMINOPHEN 5-325 MG PO TABS: 2.0000 | ORAL_TABLET | Freq: Four times a day (QID) | ORAL | Status: DC | PRN

## 2011-12-31 MED ORDER — IOHEXOL 300 MG/ML  SOLN
100.0000 mL | Freq: Once | INTRAMUSCULAR | Status: AC | PRN
  Administered 2011-12-31: 100 mL via INTRAVENOUS

## 2011-12-31 NOTE — ED Provider Notes (Signed)
History     CSN: [id number]  Arrival date & time 12/31/11  1236   First MD Initiated Contact with Patient 12/31/11 1500      Chief Complaint  Patient presents with  . Abdominal Pain  . Constipation    (Consider location/radiation/quality/duration/timing/severity/associated sxs/prior treatment) HPI  46 year old male who has chronic kidney disease currently undergoing (Tues, Sat) dialysis presents with chief complaints of abdominal pain and constipation. For the past 6 days he has been having intermittent bouts of abdominal cramping with constipation this he was able to pass liquid stool. During that time he also experiencing mild intermittent cramping sensation to his lower abdomen. Symptoms is progressively worse for the next several days. For the past 36 hours he has been unable to pass liquid or solid stool.  As his abdominal cramping is increasing in frequency. Onset was gradual, lasting for seconds to minutes. Today he felt nauseated and vomited once. Vomitus was bilious and coffee ground in appearance.  Noticed bright red blood per rectum, and pain on defecation.  Quantify amount of blood as 1/4 cup.  Sts he has been notified his PCP about his constipation for the past week and has been trying on mult.  Treatment including miralax, suppository, enema, and even manual disimpaction.  Manual disimpaction attempt was yesterday without success.  His doctor's office is closed today therefore he presents to ED.   denies any medication change, denies taking NSAIDs. Has appetite but afraid to eat.  Diagnosed with renal failure for the past 2 years. He is on a transplant list. He does not know the cause of history of failure. He acknowledge that he has trouble with his potassium and currently taking medication for it.  Next dialysis appointment is tomorrow.  Denies fever, chest pain, shortness of breath, back pain, dysuria, or rash.  Denies recent trauma.    Past Medical History  Diagnosis Date  .  Hypertension   . Hyperlipidemia   . Anxiety   . Anemia   . Joint pain   . Chronic kidney disease   . Dialysis patient     tuesday, saturday    Past Surgical History  Procedure Date  . Open anterior shoulder reconstruction     x3  . Av fistula placement 12/24/2010    3 surgery  . Hand surgery     right hand  . Back surgery     Family History  Problem Relation Age of Onset  . Adopted: Yes    History  Substance Use Topics  . Smoking status: Never Smoker   . Smokeless tobacco: Never Used  . Alcohol Use: No     stopped      Review of Systems  All other systems reviewed and are negative.    Allergies  Calcium channel blockers  Home Medications   Current Outpatient Rx  Name Route Sig Dispense Refill  . CARVEDILOL 25 MG PO TABS Oral Take 25 mg by mouth 2 (two) times daily with a meal.      . LISINOPRIL 40 MG PO TABS Oral Take 40 mg by mouth daily.    . OXYCODONE HCL 5 MG PO TABS  Take 1-2 tablets every 4 hours as needed for pain 30 tablet 0  . POLY-IRON 150 PO Oral Take 150 mg by mouth daily.      Marland Kitchen PRAVASTATIN SODIUM 20 MG PO TABS Oral Take 20 mg by mouth daily.        BP 184/127  Pulse 98  Temp(Src)  97.8 F (36.6 C) (Oral)  Resp 16  Ht 5\' 10"  (1.778 m)  Wt 200 lb (90.719 kg)  BMI 28.70 kg/m2  SpO2 100%  Physical Exam  Nursing note and vitals reviewed. Constitutional: He appears well-developed and well-nourished. No distress.       Awake, alert, nontoxic appearance  HENT:  Head: Atraumatic.  Eyes: Conjunctivae are normal. Right eye exhibits no discharge. Left eye exhibits no discharge.  Neck: Normal range of motion. Neck supple.  Cardiovascular: Normal rate and regular rhythm.   Pulmonary/Chest: Effort normal. No respiratory distress. He exhibits no tenderness.  Abdominal: Soft. There is tenderness in the right lower quadrant, suprapubic area and left lower quadrant. There is no rebound.  Musculoskeletal: He exhibits no tenderness.       ROM  appears intact, no obvious focal weakness  Neurological: He is alert.  Skin: Skin is warm and dry. No rash noted.  Psychiatric: He has a normal mood and affect.    ED Course  Procedures (including critical care time)   Labs Reviewed  CBC  DIFFERENTIAL  URINALYSIS, ROUTINE W REFLEX MICROSCOPIC   No results found.   No diagnosis found.   Date: 12/31/2011  Rate: 93  Rhythm: normal sinus rhythm  QRS Axis: normal  Intervals: normal  ST/T Wave abnormalities: normal  Conduction Disutrbances:none  Narrative Interpretation:   Old EKG Reviewed: unchanged  Results for orders placed during the hospital encounter of 12/31/11  CBC      Component Value Range   WBC 9.9  4.0 - 10.5 (K/uL)   RBC 4.93  4.22 - 5.81 (MIL/uL)   Hemoglobin 14.8  13.0 - 17.0 (g/dL)   HCT 43.6  39.0 - 52.0 (%)   MCV 88.4  78.0 - 100.0 (fL)   MCH 30.0  26.0 - 34.0 (pg)   MCHC 33.9  30.0 - 36.0 (g/dL)   RDW 13.4  11.5 - 15.5 (%)   Platelets 230  150 - 400 (K/uL)  DIFFERENTIAL      Component Value Range   Neutrophils Relative 59  43 - 77 (%)   Neutro Abs 5.9  1.7 - 7.7 (K/uL)   Lymphocytes Relative 29  12 - 46 (%)   Lymphs Abs 2.8  0.7 - 4.0 (K/uL)   Monocytes Relative 8  3 - 12 (%)   Monocytes Absolute 0.8  0.1 - 1.0 (K/uL)   Eosinophils Relative 4  0 - 5 (%)   Eosinophils Absolute 0.4  0.0 - 0.7 (K/uL)   Basophils Relative 1  0 - 1 (%)   Basophils Absolute 0.1  0.0 - 0.1 (K/uL)  URINALYSIS, ROUTINE W REFLEX MICROSCOPIC      Component Value Range   Color, Urine YELLOW  YELLOW    APPearance CLEAR  CLEAR    Specific Gravity, Urine 1.010  1.005 - 1.030    pH 6.0  5.0 - 8.0    Glucose, UA NEGATIVE  NEGATIVE (mg/dL)   Hgb urine dipstick SMALL (*) NEGATIVE    Bilirubin Urine NEGATIVE  NEGATIVE    Ketones, ur NEGATIVE  NEGATIVE (mg/dL)   Protein, ur 100 (*) NEGATIVE (mg/dL)   Urobilinogen, UA 0.2  0.0 - 1.0 (mg/dL)   Nitrite NEGATIVE  NEGATIVE    Leukocytes, UA NEGATIVE  NEGATIVE   POCT I-STAT, CHEM  8      Component Value Range   Sodium 136  135 - 145 (mEq/L)   Potassium 5.7 (*) 3.5 - 5.1 (mEq/L)   Chloride 113 (*)  96 - 112 (mEq/L)   BUN 60 (*) 6 - 23 (mg/dL)   Creatinine, Ser 7.90 (*) 0.50 - 1.35 (mg/dL)   Glucose, Bld 78  70 - 99 (mg/dL)   Calcium, Ion 1.30  1.12 - 1.32 (mmol/L)   TCO2 20  0 - 100 (mmol/L)   Hemoglobin 16.0  13.0 - 17.0 (g/dL)   HCT 47.0  39.0 - 52.0 (%)  COMPREHENSIVE METABOLIC PANEL      Component Value Range   Sodium 134 (*) 135 - 145 (mEq/L)   Potassium 5.6 (*) 3.5 - 5.1 (mEq/L)   Chloride 102  96 - 112 (mEq/L)   CO2 19  19 - 32 (mEq/L)   Glucose, Bld 71  70 - 99 (mg/dL)   BUN 61 (*) 6 - 23 (mg/dL)   Creatinine, Ser 7.73 (*) 0.50 - 1.35 (mg/dL)   Calcium 10.2  8.4 - 10.5 (mg/dL)   Total Protein 8.2  6.0 - 8.3 (g/dL)   Albumin 4.6  3.5 - 5.2 (g/dL)   AST 23  0 - 37 (U/L)   ALT 24  0 - 53 (U/L)   Alkaline Phosphatase 125 (*) 39 - 117 (U/L)   Total Bilirubin 0.3  0.3 - 1.2 (mg/dL)   GFR calc non Af Amer 8 (*) >90 (mL/min)   GFR calc Af Amer 9 (*) >90 (mL/min)  OCCULT BLOOD, POC DEVICE      Component Value Range   Fecal Occult Bld NEGATIVE    URINE MICROSCOPIC-ADD ON      Component Value Range   Squamous Epithelial / LPF RARE  RARE    WBC, UA 0-2  <3 (WBC/hpf)   RBC / HPF 0-2  <3 (RBC/hpf)   Dg Abd 1 View  12/31/2011  *RADIOLOGY REPORT*  Clinical Data: Abdominal pain constipation.  ABDOMEN - 1 VIEW  Comparison: None.  Findings: Supine abdomen shows no gaseous bowel dilatation.  A small phlebolith projects over the left anatomic pelvis.  The visualized bony structures are unremarkable.  IMPRESSION: No evidence for bowel obstruction.  No substantial stool volume is seen in the colon.  Original Report Authenticated By: ERIC A. MANSELL, M.D.   Ct Abdomen Pelvis W Contrast  12/31/2011  *RADIOLOGY REPORT*  Clinical Data: Lower abdominal pain.  Constipation.  Dialysis patient.  CT ABDOMEN AND PELVIS WITH CONTRAST  Technique:  Multidetector CT imaging of the  abdomen and pelvis was performed following the standard protocol during bolus administration of intravenous contrast.  Contrast: 155mL OMNIPAQUE IOHEXOL 300 MG/ML IJ SOLN  Comparison: CT of abdomen and pelvis 12/03/2005.  Findings:  Lung Bases: Unremarkable.  Abdomen/Pelvis:  The enhanced appearance of the liver, pancreas, spleen, bilateral adrenal glands and right kidney is unremarkable. In the lower pole of the right kidney they are two sub centimeter low attenuation lesions which are too small to definitively characterize.  In the dependent portion of the gallbladder lumen there are small high attenuation foci that may represent tiny gallstones.  However, the gallbladder does not appear distended, there is no gallbladder wall thickening, and no pericholecystic fluid or stranding to strongly suggest the presence of acute cholecystitis at this time.  There is no ascites or pneumoperitoneum and no pathologic distension of bowel.  No pathologic adenopathy noted within the abdomen or pelvis.  There are a few colonic diverticula, without surrounding inflammatory changes to suggest an acute diverticulitis at this time.  Prostate and urinary bladder are unremarkable in appearance.  A small left inguinal hernia containing only  fat is incidentally noted.  Musculoskeletal: There are no aggressive appearing lytic or blastic lesions noted in the visualized portions of the skeleton.  IMPRESSION: 1.  No acute findings in the abdomen or pelvis to account for the patient's symptoms. 2.  Mild colonic diverticulosis without findings to suggest acute diverticulitis. 3.  Probable small gallstones layering dependently within the gallbladder lumen, without evidence to suggest acute cholecystitis at this time. 4.  Subcentimeter low attenuation lesions in the lower pole of the left kidney are too small to definitively characterize. Statistically, these are favored to represent small cysts. 5.  Small left inguinal hernia containing only  fat.  Original Report Authenticated By: Etheleen Mayhew, M.D.      MDM  Lower abd pain with constipation x 5 days.  Will obtain abd/pel w contrast.  Consider constipation, appy, diverticular disease, obstruction.    Pain management and diagnostic labs ordered.    3:59 PM Hyperkalemia, no changes on ECG, stable BP.  Will continue to monitor, repeat lab.     5:12 PM Kayexalate given for hyperkalemia. Abdominal and pelvis CT shows no acute finding. My evidence of diverticulosis without evidence of diverticulitis. Urine shows no signs of infection. Patient has normal white count. I discussed result CT scan with patient and also give reassurance. Also recommend for follow up with the GI specialist. Patient states he feels better. He will have his dialysis tomorrow and will follow up with GI next week. I recommend followup, or return to ER if symptoms worsen. Patient was understanding. Plan discussed with my attending.     Domenic Moras, PA-C 12/31/11 1910

## 2011-12-31 NOTE — ED Notes (Signed)
Patient has been seen by his md since Monday trying to treat constipation/impaction.  Patient states they tried to manually remove fecal matter but they were unable to do so.

## 2011-12-31 NOTE — Discharge Instructions (Signed)
Please follow up with a GI doctor for further evaluation.  Take medication as prescribed.  Attend your scheduled dialysis tomorrow.  Eat diet with fiber.  Return to ER if your symptoms worsen or if you have other concerns.    Abdominal Pain Abdominal pain can be caused by many things. Your caregiver decides the seriousness of your pain by an examination and possibly blood tests and X-rays. Many cases can be observed and treated at home. Most abdominal pain is not caused by a disease and will probably improve without treatment. However, in many cases, more time must pass before a clear cause of the pain can be found. Before that point, it may not be known if you need more testing, or if hospitalization or surgery is needed. HOME CARE INSTRUCTIONS   Do not take laxatives unless directed by your caregiver.   Take pain medicine only as directed by your caregiver.   Only take over-the-counter or prescription medicines for pain, discomfort, or fever as directed by your caregiver.   Try a clear liquid diet (broth, tea, or water) for as long as directed by your caregiver. Slowly move to a bland diet as tolerated.  SEEK IMMEDIATE MEDICAL CARE IF:   The pain does not go away.   You have a fever.   You keep throwing up (vomiting).   The pain is felt only in portions of the abdomen. Pain in the right side could possibly be appendicitis. In an adult, pain in the left lower portion of the abdomen could be colitis or diverticulitis.   You pass bloody or black tarry stools.  MAKE SURE YOU:   Understand these instructions.   Will watch your condition.   Will get help right away if you are not doing well or get worse.  Document Released: 06/30/2005 Document Revised: 09/09/2011 Document Reviewed: 05/08/2008 [facility] Patient Information 2012 [place].

## 2011-12-31 NOTE — ED Provider Notes (Signed)
Medical screening examination/treatment/procedure(s) were performed by non-physician practitioner and as supervising physician I was immediately available for consultation/collaboration.  Jasper Riling. Alvino Chapel, MD 12/31/11 [phone]

## 2011-12-31 NOTE — ED Notes (Signed)
Pt states that for the past few days he has been extremely constipated. He states that he has used multiple otc products including suppositories, laxatives, mag citrate, enemas with no relief. He states that he has severe pressure in his lower abdominal area and has been so "backed up" that he can no longer tolerate to eat anything and is now vomiting and pooping blood. Iv started and labs obtained. Pt is a tues thurs dialysis pt. His fistula is in the right upper arm. He has an old fistula in the left upper arm that is no longer functional.

## 2012-01-01 DIAGNOSIS — N186 End stage renal disease: Secondary | ICD-10-CM | POA: Diagnosis not present

## 2012-01-01 DIAGNOSIS — N2581 Secondary hyperparathyroidism of renal origin: Secondary | ICD-10-CM | POA: Diagnosis not present

## 2012-01-01 DIAGNOSIS — R509 Fever, unspecified: Secondary | ICD-10-CM | POA: Diagnosis not present

## 2012-01-01 DIAGNOSIS — D509 Iron deficiency anemia, unspecified: Secondary | ICD-10-CM | POA: Diagnosis not present

## 2012-01-01 DIAGNOSIS — R350 Frequency of micturition: Secondary | ICD-10-CM | POA: Diagnosis not present

## 2012-01-04 DIAGNOSIS — N186 End stage renal disease: Secondary | ICD-10-CM | POA: Diagnosis not present

## 2012-01-04 DIAGNOSIS — N2581 Secondary hyperparathyroidism of renal origin: Secondary | ICD-10-CM | POA: Diagnosis not present

## 2012-01-04 DIAGNOSIS — D509 Iron deficiency anemia, unspecified: Secondary | ICD-10-CM | POA: Diagnosis not present

## 2012-01-08 DIAGNOSIS — N186 End stage renal disease: Secondary | ICD-10-CM | POA: Diagnosis not present

## 2012-01-08 DIAGNOSIS — N2581 Secondary hyperparathyroidism of renal origin: Secondary | ICD-10-CM | POA: Diagnosis not present

## 2012-01-08 DIAGNOSIS — D509 Iron deficiency anemia, unspecified: Secondary | ICD-10-CM | POA: Diagnosis not present

## 2012-01-11 DIAGNOSIS — N186 End stage renal disease: Secondary | ICD-10-CM | POA: Diagnosis not present

## 2012-01-11 DIAGNOSIS — D509 Iron deficiency anemia, unspecified: Secondary | ICD-10-CM | POA: Diagnosis not present

## 2012-01-11 DIAGNOSIS — N2581 Secondary hyperparathyroidism of renal origin: Secondary | ICD-10-CM | POA: Diagnosis not present

## 2012-01-18 DIAGNOSIS — N186 End stage renal disease: Secondary | ICD-10-CM | POA: Diagnosis not present

## 2012-01-18 DIAGNOSIS — D509 Iron deficiency anemia, unspecified: Secondary | ICD-10-CM | POA: Diagnosis not present

## 2012-01-18 DIAGNOSIS — N2581 Secondary hyperparathyroidism of renal origin: Secondary | ICD-10-CM | POA: Diagnosis not present

## 2012-01-22 DIAGNOSIS — D509 Iron deficiency anemia, unspecified: Secondary | ICD-10-CM | POA: Diagnosis not present

## 2012-01-22 DIAGNOSIS — N2581 Secondary hyperparathyroidism of renal origin: Secondary | ICD-10-CM | POA: Diagnosis not present

## 2012-01-22 DIAGNOSIS — N186 End stage renal disease: Secondary | ICD-10-CM | POA: Diagnosis not present

## 2012-01-25 DIAGNOSIS — D509 Iron deficiency anemia, unspecified: Secondary | ICD-10-CM | POA: Diagnosis not present

## 2012-01-25 DIAGNOSIS — N2581 Secondary hyperparathyroidism of renal origin: Secondary | ICD-10-CM | POA: Diagnosis not present

## 2012-01-25 DIAGNOSIS — N186 End stage renal disease: Secondary | ICD-10-CM | POA: Diagnosis not present

## 2012-02-01 DIAGNOSIS — D509 Iron deficiency anemia, unspecified: Secondary | ICD-10-CM | POA: Diagnosis not present

## 2012-02-01 DIAGNOSIS — N186 End stage renal disease: Secondary | ICD-10-CM | POA: Diagnosis not present

## 2012-02-01 DIAGNOSIS — N2581 Secondary hyperparathyroidism of renal origin: Secondary | ICD-10-CM | POA: Diagnosis not present

## 2012-02-15 DIAGNOSIS — D509 Iron deficiency anemia, unspecified: Secondary | ICD-10-CM | POA: Diagnosis not present

## 2012-02-15 DIAGNOSIS — N186 End stage renal disease: Secondary | ICD-10-CM | POA: Diagnosis not present

## 2012-02-15 DIAGNOSIS — N2581 Secondary hyperparathyroidism of renal origin: Secondary | ICD-10-CM | POA: Diagnosis not present

## 2012-02-15 DIAGNOSIS — M25819 Other specified joint disorders, unspecified shoulder: Secondary | ICD-10-CM | POA: Diagnosis not present

## 2012-02-29 DIAGNOSIS — N2581 Secondary hyperparathyroidism of renal origin: Secondary | ICD-10-CM | POA: Diagnosis not present

## 2012-02-29 DIAGNOSIS — N186 End stage renal disease: Secondary | ICD-10-CM | POA: Diagnosis not present

## 2012-02-29 DIAGNOSIS — D509 Iron deficiency anemia, unspecified: Secondary | ICD-10-CM | POA: Diagnosis not present

## 2012-03-03 DIAGNOSIS — N186 End stage renal disease: Secondary | ICD-10-CM | POA: Diagnosis not present

## 2012-04-05 DIAGNOSIS — F988 Other specified behavioral and emotional disorders with onset usually occurring in childhood and adolescence: Secondary | ICD-10-CM | POA: Diagnosis not present

## 2012-04-05 DIAGNOSIS — M545 Low back pain, unspecified: Secondary | ICD-10-CM | POA: Diagnosis not present

## 2012-04-05 DIAGNOSIS — F411 Generalized anxiety disorder: Secondary | ICD-10-CM | POA: Diagnosis not present

## 2012-04-11 DIAGNOSIS — M25819 Other specified joint disorders, unspecified shoulder: Secondary | ICD-10-CM | POA: Diagnosis not present

## 2012-04-20 DIAGNOSIS — N186 End stage renal disease: Secondary | ICD-10-CM | POA: Diagnosis not present

## 2012-04-20 DIAGNOSIS — D509 Iron deficiency anemia, unspecified: Secondary | ICD-10-CM | POA: Diagnosis not present

## 2012-04-20 DIAGNOSIS — N2581 Secondary hyperparathyroidism of renal origin: Secondary | ICD-10-CM | POA: Diagnosis not present

## 2012-04-20 DIAGNOSIS — Z5181 Encounter for therapeutic drug level monitoring: Secondary | ICD-10-CM | POA: Diagnosis not present

## 2012-04-20 DIAGNOSIS — Z7682 Awaiting organ transplant status: Secondary | ICD-10-CM | POA: Diagnosis not present

## 2012-04-22 DIAGNOSIS — D509 Iron deficiency anemia, unspecified: Secondary | ICD-10-CM | POA: Diagnosis not present

## 2012-04-22 DIAGNOSIS — N186 End stage renal disease: Secondary | ICD-10-CM | POA: Diagnosis not present

## 2012-04-22 DIAGNOSIS — N2581 Secondary hyperparathyroidism of renal origin: Secondary | ICD-10-CM | POA: Diagnosis not present

## 2012-04-25 DIAGNOSIS — D509 Iron deficiency anemia, unspecified: Secondary | ICD-10-CM | POA: Diagnosis not present

## 2012-04-25 DIAGNOSIS — N186 End stage renal disease: Secondary | ICD-10-CM | POA: Diagnosis not present

## 2012-04-25 DIAGNOSIS — N2581 Secondary hyperparathyroidism of renal origin: Secondary | ICD-10-CM | POA: Diagnosis not present

## 2012-04-29 DIAGNOSIS — N2581 Secondary hyperparathyroidism of renal origin: Secondary | ICD-10-CM | POA: Diagnosis not present

## 2012-04-29 DIAGNOSIS — N186 End stage renal disease: Secondary | ICD-10-CM | POA: Diagnosis not present

## 2012-04-29 DIAGNOSIS — D509 Iron deficiency anemia, unspecified: Secondary | ICD-10-CM | POA: Diagnosis not present

## 2012-05-02 DIAGNOSIS — D509 Iron deficiency anemia, unspecified: Secondary | ICD-10-CM | POA: Diagnosis not present

## 2012-05-02 DIAGNOSIS — N2581 Secondary hyperparathyroidism of renal origin: Secondary | ICD-10-CM | POA: Diagnosis not present

## 2012-05-02 DIAGNOSIS — N186 End stage renal disease: Secondary | ICD-10-CM | POA: Diagnosis not present

## 2012-05-03 DIAGNOSIS — N186 End stage renal disease: Secondary | ICD-10-CM | POA: Diagnosis not present

## 2012-05-06 DIAGNOSIS — N186 End stage renal disease: Secondary | ICD-10-CM | POA: Diagnosis not present

## 2012-05-06 DIAGNOSIS — N2581 Secondary hyperparathyroidism of renal origin: Secondary | ICD-10-CM | POA: Diagnosis not present

## 2012-05-13 DIAGNOSIS — N2581 Secondary hyperparathyroidism of renal origin: Secondary | ICD-10-CM | POA: Diagnosis not present

## 2012-05-13 DIAGNOSIS — N186 End stage renal disease: Secondary | ICD-10-CM | POA: Diagnosis not present

## 2012-05-25 DIAGNOSIS — R509 Fever, unspecified: Secondary | ICD-10-CM | POA: Diagnosis not present

## 2012-05-25 DIAGNOSIS — R05 Cough: Secondary | ICD-10-CM | POA: Diagnosis not present

## 2012-05-25 DIAGNOSIS — R059 Cough, unspecified: Secondary | ICD-10-CM | POA: Diagnosis not present

## 2012-05-31 DIAGNOSIS — J029 Acute pharyngitis, unspecified: Secondary | ICD-10-CM | POA: Diagnosis not present

## 2012-06-28 DIAGNOSIS — M545 Low back pain, unspecified: Secondary | ICD-10-CM | POA: Diagnosis not present

## 2012-06-28 DIAGNOSIS — Z77120 Contact with and (suspected) exposure to mold (toxic): Secondary | ICD-10-CM | POA: Diagnosis not present

## 2012-07-05 ENCOUNTER — Other Ambulatory Visit: Payer: Self-pay | Admitting: Orthopedic Surgery

## 2012-07-05 ENCOUNTER — Ambulatory Visit
Admission: RE | Admit: 2012-07-05 | Discharge: 2012-07-05 | Disposition: A | Discharge status: Home / Self Care | Payer: Medicare Other | Source: Ambulatory Visit | Attending: Orthopedic Surgery | Admitting: Orthopedic Surgery

## 2012-07-05 DIAGNOSIS — N186 End stage renal disease: Secondary | ICD-10-CM | POA: Diagnosis not present

## 2012-07-05 DIAGNOSIS — M25519 Pain in unspecified shoulder: Secondary | ICD-10-CM | POA: Diagnosis not present

## 2012-07-05 DIAGNOSIS — M25512 Pain in left shoulder: Secondary | ICD-10-CM

## 2012-07-05 DIAGNOSIS — D649 Anemia, unspecified: Secondary | ICD-10-CM | POA: Diagnosis not present

## 2012-07-05 DIAGNOSIS — N2581 Secondary hyperparathyroidism of renal origin: Secondary | ICD-10-CM | POA: Diagnosis not present

## 2012-07-05 DIAGNOSIS — M25819 Other specified joint disorders, unspecified shoulder: Secondary | ICD-10-CM | POA: Diagnosis not present

## 2012-07-12 DIAGNOSIS — F988 Other specified behavioral and emotional disorders with onset usually occurring in childhood and adolescence: Secondary | ICD-10-CM | POA: Diagnosis not present

## 2012-07-12 DIAGNOSIS — M545 Low back pain, unspecified: Secondary | ICD-10-CM | POA: Diagnosis not present

## 2012-07-12 DIAGNOSIS — F411 Generalized anxiety disorder: Secondary | ICD-10-CM | POA: Diagnosis not present

## 2012-07-12 DIAGNOSIS — Z77120 Contact with and (suspected) exposure to mold (toxic): Secondary | ICD-10-CM | POA: Diagnosis not present

## 2012-08-17 DIAGNOSIS — N185 Chronic kidney disease, stage 5: Secondary | ICD-10-CM | POA: Diagnosis not present

## 2012-08-18 DIAGNOSIS — Z23 Encounter for immunization: Secondary | ICD-10-CM | POA: Diagnosis not present

## 2012-10-31 DIAGNOSIS — F988 Other specified behavioral and emotional disorders with onset usually occurring in childhood and adolescence: Secondary | ICD-10-CM | POA: Diagnosis not present

## 2012-10-31 DIAGNOSIS — M545 Low back pain, unspecified: Secondary | ICD-10-CM | POA: Diagnosis not present

## 2012-10-31 DIAGNOSIS — F411 Generalized anxiety disorder: Secondary | ICD-10-CM | POA: Diagnosis not present

## 2012-10-31 DIAGNOSIS — N189 Chronic kidney disease, unspecified: Secondary | ICD-10-CM | POA: Diagnosis not present

## 2012-12-25 DIAGNOSIS — M25819 Other specified joint disorders, unspecified shoulder: Secondary | ICD-10-CM | POA: Diagnosis not present

## 2013-05-04 DIAGNOSIS — M545 Low back pain, unspecified: Secondary | ICD-10-CM | POA: Diagnosis not present

## 2013-05-04 DIAGNOSIS — N529 Male erectile dysfunction, unspecified: Secondary | ICD-10-CM | POA: Diagnosis not present

## 2013-05-04 DIAGNOSIS — I10 Essential (primary) hypertension: Secondary | ICD-10-CM | POA: Diagnosis not present

## 2013-07-09 DIAGNOSIS — Z23 Encounter for immunization: Secondary | ICD-10-CM | POA: Diagnosis not present

## 2013-08-17 DIAGNOSIS — N189 Chronic kidney disease, unspecified: Secondary | ICD-10-CM | POA: Diagnosis not present

## 2013-08-17 DIAGNOSIS — F411 Generalized anxiety disorder: Secondary | ICD-10-CM | POA: Diagnosis not present

## 2013-08-17 DIAGNOSIS — F988 Other specified behavioral and emotional disorders with onset usually occurring in childhood and adolescence: Secondary | ICD-10-CM | POA: Diagnosis not present

## 2013-08-17 DIAGNOSIS — N529 Male erectile dysfunction, unspecified: Secondary | ICD-10-CM | POA: Diagnosis not present

## 2013-08-17 DIAGNOSIS — M545 Low back pain, unspecified: Secondary | ICD-10-CM | POA: Diagnosis not present

## 2013-09-06 DIAGNOSIS — N186 End stage renal disease: Secondary | ICD-10-CM | POA: Diagnosis not present

## 2013-09-13 DIAGNOSIS — I10 Essential (primary) hypertension: Secondary | ICD-10-CM | POA: Diagnosis not present

## 2013-10-30 DIAGNOSIS — N19 Unspecified kidney failure: Secondary | ICD-10-CM | POA: Diagnosis not present

## 2013-10-30 DIAGNOSIS — N186 End stage renal disease: Secondary | ICD-10-CM | POA: Diagnosis not present

## 2013-10-30 DIAGNOSIS — R809 Proteinuria, unspecified: Secondary | ICD-10-CM | POA: Diagnosis not present

## 2013-10-30 DIAGNOSIS — D509 Iron deficiency anemia, unspecified: Secondary | ICD-10-CM | POA: Diagnosis not present

## 2013-11-01 DIAGNOSIS — N186 End stage renal disease: Secondary | ICD-10-CM | POA: Diagnosis not present

## 2013-11-01 DIAGNOSIS — N2581 Secondary hyperparathyroidism of renal origin: Secondary | ICD-10-CM | POA: Diagnosis not present

## 2013-11-03 DIAGNOSIS — N186 End stage renal disease: Secondary | ICD-10-CM | POA: Diagnosis not present

## 2013-11-03 DIAGNOSIS — N2581 Secondary hyperparathyroidism of renal origin: Secondary | ICD-10-CM | POA: Diagnosis not present

## 2013-11-06 DIAGNOSIS — N186 End stage renal disease: Secondary | ICD-10-CM | POA: Diagnosis not present

## 2013-11-06 DIAGNOSIS — D509 Iron deficiency anemia, unspecified: Secondary | ICD-10-CM | POA: Diagnosis not present

## 2013-11-06 DIAGNOSIS — N2581 Secondary hyperparathyroidism of renal origin: Secondary | ICD-10-CM | POA: Diagnosis not present

## 2013-11-08 DIAGNOSIS — N2581 Secondary hyperparathyroidism of renal origin: Secondary | ICD-10-CM | POA: Diagnosis not present

## 2013-11-08 DIAGNOSIS — D509 Iron deficiency anemia, unspecified: Secondary | ICD-10-CM | POA: Diagnosis not present

## 2013-11-08 DIAGNOSIS — N186 End stage renal disease: Secondary | ICD-10-CM | POA: Diagnosis not present

## 2013-11-12 DIAGNOSIS — F411 Generalized anxiety disorder: Secondary | ICD-10-CM | POA: Diagnosis not present

## 2013-11-12 DIAGNOSIS — N400 Enlarged prostate without lower urinary tract symptoms: Secondary | ICD-10-CM | POA: Diagnosis not present

## 2013-11-12 DIAGNOSIS — F988 Other specified behavioral and emotional disorders with onset usually occurring in childhood and adolescence: Secondary | ICD-10-CM | POA: Diagnosis not present

## 2013-11-12 DIAGNOSIS — M545 Low back pain, unspecified: Secondary | ICD-10-CM | POA: Diagnosis not present

## 2013-11-15 DIAGNOSIS — N2581 Secondary hyperparathyroidism of renal origin: Secondary | ICD-10-CM | POA: Diagnosis not present

## 2013-11-15 DIAGNOSIS — N186 End stage renal disease: Secondary | ICD-10-CM | POA: Diagnosis not present

## 2013-11-15 DIAGNOSIS — D509 Iron deficiency anemia, unspecified: Secondary | ICD-10-CM | POA: Diagnosis not present

## 2013-11-24 DIAGNOSIS — N186 End stage renal disease: Secondary | ICD-10-CM | POA: Diagnosis not present

## 2013-11-24 DIAGNOSIS — D509 Iron deficiency anemia, unspecified: Secondary | ICD-10-CM | POA: Diagnosis not present

## 2013-11-24 DIAGNOSIS — N2581 Secondary hyperparathyroidism of renal origin: Secondary | ICD-10-CM | POA: Diagnosis not present

## 2013-11-27 DIAGNOSIS — D509 Iron deficiency anemia, unspecified: Secondary | ICD-10-CM | POA: Diagnosis not present

## 2013-11-27 DIAGNOSIS — N2581 Secondary hyperparathyroidism of renal origin: Secondary | ICD-10-CM | POA: Diagnosis not present

## 2013-11-27 DIAGNOSIS — N186 End stage renal disease: Secondary | ICD-10-CM | POA: Diagnosis not present

## 2013-12-01 DIAGNOSIS — N2581 Secondary hyperparathyroidism of renal origin: Secondary | ICD-10-CM | POA: Diagnosis not present

## 2013-12-01 DIAGNOSIS — N186 End stage renal disease: Secondary | ICD-10-CM | POA: Diagnosis not present

## 2013-12-01 DIAGNOSIS — D509 Iron deficiency anemia, unspecified: Secondary | ICD-10-CM | POA: Diagnosis not present

## 2014-01-01 DIAGNOSIS — N186 End stage renal disease: Secondary | ICD-10-CM | POA: Diagnosis not present

## 2014-01-02 DIAGNOSIS — N186 End stage renal disease: Secondary | ICD-10-CM | POA: Diagnosis not present

## 2014-01-30 DIAGNOSIS — Z87828 Personal history of other (healed) physical injury and trauma: Secondary | ICD-10-CM | POA: Diagnosis not present

## 2014-01-30 DIAGNOSIS — G8929 Other chronic pain: Secondary | ICD-10-CM | POA: Diagnosis not present

## 2014-01-30 DIAGNOSIS — Z992 Dependence on renal dialysis: Secondary | ICD-10-CM | POA: Diagnosis not present

## 2014-01-30 DIAGNOSIS — N185 Chronic kidney disease, stage 5: Secondary | ICD-10-CM | POA: Diagnosis not present

## 2014-01-30 DIAGNOSIS — N2581 Secondary hyperparathyroidism of renal origin: Secondary | ICD-10-CM | POA: Diagnosis not present

## 2014-01-30 DIAGNOSIS — E872 Acidosis, unspecified: Secondary | ICD-10-CM | POA: Diagnosis not present

## 2014-01-30 DIAGNOSIS — R634 Abnormal weight loss: Secondary | ICD-10-CM | POA: Diagnosis not present

## 2014-01-30 DIAGNOSIS — I120 Hypertensive chronic kidney disease with stage 5 chronic kidney disease or end stage renal disease: Secondary | ICD-10-CM | POA: Diagnosis not present

## 2014-02-15 DIAGNOSIS — E872 Acidosis, unspecified: Secondary | ICD-10-CM | POA: Diagnosis not present

## 2014-02-15 DIAGNOSIS — R634 Abnormal weight loss: Secondary | ICD-10-CM | POA: Diagnosis not present

## 2014-02-15 DIAGNOSIS — N185 Chronic kidney disease, stage 5: Secondary | ICD-10-CM | POA: Diagnosis not present

## 2014-02-15 DIAGNOSIS — N2581 Secondary hyperparathyroidism of renal origin: Secondary | ICD-10-CM | POA: Diagnosis not present

## 2014-02-15 DIAGNOSIS — N19 Unspecified kidney failure: Secondary | ICD-10-CM | POA: Diagnosis not present

## 2014-02-15 DIAGNOSIS — I120 Hypertensive chronic kidney disease with stage 5 chronic kidney disease or end stage renal disease: Secondary | ICD-10-CM | POA: Diagnosis not present

## 2014-03-15 DIAGNOSIS — M545 Low back pain, unspecified: Secondary | ICD-10-CM | POA: Diagnosis not present

## 2014-03-15 DIAGNOSIS — F411 Generalized anxiety disorder: Secondary | ICD-10-CM | POA: Diagnosis not present

## 2014-03-15 DIAGNOSIS — N189 Chronic kidney disease, unspecified: Secondary | ICD-10-CM | POA: Diagnosis not present

## 2014-03-15 DIAGNOSIS — I120 Hypertensive chronic kidney disease with stage 5 chronic kidney disease or end stage renal disease: Secondary | ICD-10-CM | POA: Diagnosis not present

## 2014-04-15 DIAGNOSIS — N185 Chronic kidney disease, stage 5: Secondary | ICD-10-CM | POA: Diagnosis not present

## 2014-04-15 DIAGNOSIS — E872 Acidosis, unspecified: Secondary | ICD-10-CM | POA: Diagnosis not present

## 2014-04-15 DIAGNOSIS — I120 Hypertensive chronic kidney disease with stage 5 chronic kidney disease or end stage renal disease: Secondary | ICD-10-CM | POA: Diagnosis not present

## 2014-04-15 DIAGNOSIS — N186 End stage renal disease: Secondary | ICD-10-CM | POA: Diagnosis not present

## 2014-04-15 DIAGNOSIS — E1329 Other specified diabetes mellitus with other diabetic kidney complication: Secondary | ICD-10-CM | POA: Diagnosis not present

## 2014-04-15 DIAGNOSIS — D631 Anemia in chronic kidney disease: Secondary | ICD-10-CM | POA: Diagnosis not present

## 2014-05-23 DIAGNOSIS — F988 Other specified behavioral and emotional disorders with onset usually occurring in childhood and adolescence: Secondary | ICD-10-CM | POA: Diagnosis not present

## 2014-05-23 DIAGNOSIS — F411 Generalized anxiety disorder: Secondary | ICD-10-CM | POA: Diagnosis not present

## 2014-05-23 DIAGNOSIS — N529 Male erectile dysfunction, unspecified: Secondary | ICD-10-CM | POA: Diagnosis not present

## 2014-05-23 DIAGNOSIS — IMO0002 Reserved for concepts with insufficient information to code with codable children: Secondary | ICD-10-CM | POA: Diagnosis not present

## 2014-05-23 DIAGNOSIS — N184 Chronic kidney disease, stage 4 (severe): Secondary | ICD-10-CM | POA: Diagnosis not present

## 2014-05-23 DIAGNOSIS — I120 Hypertensive chronic kidney disease with stage 5 chronic kidney disease or end stage renal disease: Secondary | ICD-10-CM | POA: Diagnosis not present

## 2014-06-25 DIAGNOSIS — F988 Other specified behavioral and emotional disorders with onset usually occurring in childhood and adolescence: Secondary | ICD-10-CM | POA: Diagnosis not present

## 2014-06-25 DIAGNOSIS — IMO0002 Reserved for concepts with insufficient information to code with codable children: Secondary | ICD-10-CM | POA: Diagnosis not present

## 2014-06-25 DIAGNOSIS — Z23 Encounter for immunization: Secondary | ICD-10-CM | POA: Diagnosis not present

## 2014-06-25 DIAGNOSIS — N184 Chronic kidney disease, stage 4 (severe): Secondary | ICD-10-CM | POA: Diagnosis not present

## 2014-06-25 DIAGNOSIS — I120 Hypertensive chronic kidney disease with stage 5 chronic kidney disease or end stage renal disease: Secondary | ICD-10-CM | POA: Diagnosis not present

## 2014-07-30 DIAGNOSIS — M5416 Radiculopathy, lumbar region: Secondary | ICD-10-CM | POA: Diagnosis not present

## 2014-07-30 DIAGNOSIS — N528 Other male erectile dysfunction: Secondary | ICD-10-CM | POA: Diagnosis not present

## 2014-07-30 DIAGNOSIS — N401 Enlarged prostate with lower urinary tract symptoms: Secondary | ICD-10-CM | POA: Diagnosis not present

## 2014-07-30 DIAGNOSIS — I120 Hypertensive chronic kidney disease with stage 5 chronic kidney disease or end stage renal disease: Secondary | ICD-10-CM | POA: Diagnosis not present

## 2014-07-30 DIAGNOSIS — M545 Low back pain: Secondary | ICD-10-CM | POA: Diagnosis not present

## 2014-07-30 DIAGNOSIS — N185 Chronic kidney disease, stage 5: Secondary | ICD-10-CM | POA: Diagnosis not present

## 2014-07-30 DIAGNOSIS — F900 Attention-deficit hyperactivity disorder, predominantly inattentive type: Secondary | ICD-10-CM | POA: Diagnosis not present

## 2014-07-30 DIAGNOSIS — F419 Anxiety disorder, unspecified: Secondary | ICD-10-CM | POA: Diagnosis not present

## 2014-08-27 DIAGNOSIS — N189 Chronic kidney disease, unspecified: Secondary | ICD-10-CM | POA: Diagnosis not present

## 2014-08-27 DIAGNOSIS — N529 Male erectile dysfunction, unspecified: Secondary | ICD-10-CM | POA: Diagnosis not present

## 2014-09-19 DIAGNOSIS — M545 Low back pain: Secondary | ICD-10-CM | POA: Diagnosis not present

## 2014-09-19 DIAGNOSIS — I120 Hypertensive chronic kidney disease with stage 5 chronic kidney disease or end stage renal disease: Secondary | ICD-10-CM | POA: Diagnosis not present

## 2014-09-19 DIAGNOSIS — N185 Chronic kidney disease, stage 5: Secondary | ICD-10-CM | POA: Diagnosis not present

## 2014-10-17 DIAGNOSIS — N185 Chronic kidney disease, stage 5: Secondary | ICD-10-CM | POA: Diagnosis not present

## 2014-10-30 DIAGNOSIS — N185 Chronic kidney disease, stage 5: Secondary | ICD-10-CM | POA: Diagnosis not present

## 2014-11-14 DIAGNOSIS — N528 Other male erectile dysfunction: Secondary | ICD-10-CM | POA: Diagnosis not present

## 2014-11-14 DIAGNOSIS — N185 Chronic kidney disease, stage 5: Secondary | ICD-10-CM | POA: Diagnosis not present

## 2014-11-14 DIAGNOSIS — I120 Hypertensive chronic kidney disease with stage 5 chronic kidney disease or end stage renal disease: Secondary | ICD-10-CM | POA: Diagnosis not present

## 2014-11-14 DIAGNOSIS — M545 Low back pain: Secondary | ICD-10-CM | POA: Diagnosis not present

## 2014-11-21 DIAGNOSIS — N185 Chronic kidney disease, stage 5: Secondary | ICD-10-CM | POA: Diagnosis not present

## 2014-11-26 DIAGNOSIS — E872 Acidosis: Secondary | ICD-10-CM | POA: Diagnosis not present

## 2014-11-26 DIAGNOSIS — D649 Anemia, unspecified: Secondary | ICD-10-CM | POA: Diagnosis not present

## 2014-11-26 DIAGNOSIS — N185 Chronic kidney disease, stage 5: Secondary | ICD-10-CM | POA: Diagnosis not present

## 2014-11-26 DIAGNOSIS — I120 Hypertensive chronic kidney disease with stage 5 chronic kidney disease or end stage renal disease: Secondary | ICD-10-CM | POA: Diagnosis not present

## 2014-12-05 DIAGNOSIS — Z114 Encounter for screening for human immunodeficiency virus [HIV]: Secondary | ICD-10-CM | POA: Diagnosis not present

## 2014-12-05 DIAGNOSIS — N2581 Secondary hyperparathyroidism of renal origin: Secondary | ICD-10-CM | POA: Diagnosis not present

## 2014-12-05 DIAGNOSIS — N186 End stage renal disease: Secondary | ICD-10-CM | POA: Diagnosis not present

## 2014-12-05 DIAGNOSIS — D631 Anemia in chronic kidney disease: Secondary | ICD-10-CM | POA: Diagnosis not present

## 2014-12-05 DIAGNOSIS — Z1159 Encounter for screening for other viral diseases: Secondary | ICD-10-CM | POA: Diagnosis not present

## 2014-12-10 DIAGNOSIS — N186 End stage renal disease: Secondary | ICD-10-CM | POA: Diagnosis not present

## 2014-12-10 DIAGNOSIS — D631 Anemia in chronic kidney disease: Secondary | ICD-10-CM | POA: Diagnosis not present

## 2014-12-10 DIAGNOSIS — N2581 Secondary hyperparathyroidism of renal origin: Secondary | ICD-10-CM | POA: Diagnosis not present

## 2014-12-12 DIAGNOSIS — N186 End stage renal disease: Secondary | ICD-10-CM | POA: Diagnosis not present

## 2014-12-12 DIAGNOSIS — D631 Anemia in chronic kidney disease: Secondary | ICD-10-CM | POA: Diagnosis not present

## 2014-12-12 DIAGNOSIS — N2581 Secondary hyperparathyroidism of renal origin: Secondary | ICD-10-CM | POA: Diagnosis not present

## 2014-12-17 DIAGNOSIS — D631 Anemia in chronic kidney disease: Secondary | ICD-10-CM | POA: Diagnosis not present

## 2014-12-17 DIAGNOSIS — N2581 Secondary hyperparathyroidism of renal origin: Secondary | ICD-10-CM | POA: Diagnosis not present

## 2014-12-17 DIAGNOSIS — N186 End stage renal disease: Secondary | ICD-10-CM | POA: Diagnosis not present

## 2014-12-19 DIAGNOSIS — N186 End stage renal disease: Secondary | ICD-10-CM | POA: Diagnosis not present

## 2014-12-19 DIAGNOSIS — D631 Anemia in chronic kidney disease: Secondary | ICD-10-CM | POA: Diagnosis not present

## 2014-12-19 DIAGNOSIS — N2581 Secondary hyperparathyroidism of renal origin: Secondary | ICD-10-CM | POA: Diagnosis not present

## 2014-12-24 DIAGNOSIS — N186 End stage renal disease: Secondary | ICD-10-CM | POA: Diagnosis not present

## 2014-12-24 DIAGNOSIS — N2581 Secondary hyperparathyroidism of renal origin: Secondary | ICD-10-CM | POA: Diagnosis not present

## 2014-12-24 DIAGNOSIS — D631 Anemia in chronic kidney disease: Secondary | ICD-10-CM | POA: Diagnosis not present

## 2014-12-26 DIAGNOSIS — N186 End stage renal disease: Secondary | ICD-10-CM | POA: Diagnosis not present

## 2014-12-26 DIAGNOSIS — D631 Anemia in chronic kidney disease: Secondary | ICD-10-CM | POA: Diagnosis not present

## 2014-12-26 DIAGNOSIS — N2581 Secondary hyperparathyroidism of renal origin: Secondary | ICD-10-CM | POA: Diagnosis not present

## 2014-12-31 DIAGNOSIS — D631 Anemia in chronic kidney disease: Secondary | ICD-10-CM | POA: Diagnosis not present

## 2014-12-31 DIAGNOSIS — N2581 Secondary hyperparathyroidism of renal origin: Secondary | ICD-10-CM | POA: Diagnosis not present

## 2014-12-31 DIAGNOSIS — N186 End stage renal disease: Secondary | ICD-10-CM | POA: Diagnosis not present

## 2015-01-02 DIAGNOSIS — N186 End stage renal disease: Secondary | ICD-10-CM | POA: Diagnosis not present

## 2015-01-02 DIAGNOSIS — D631 Anemia in chronic kidney disease: Secondary | ICD-10-CM | POA: Diagnosis not present

## 2015-01-02 DIAGNOSIS — Z992 Dependence on renal dialysis: Secondary | ICD-10-CM | POA: Diagnosis not present

## 2015-01-02 DIAGNOSIS — N2581 Secondary hyperparathyroidism of renal origin: Secondary | ICD-10-CM | POA: Diagnosis not present

## 2015-01-09 DIAGNOSIS — D631 Anemia in chronic kidney disease: Secondary | ICD-10-CM | POA: Diagnosis not present

## 2015-01-09 DIAGNOSIS — N186 End stage renal disease: Secondary | ICD-10-CM | POA: Diagnosis not present

## 2015-01-09 DIAGNOSIS — E1065 Type 1 diabetes mellitus with hyperglycemia: Secondary | ICD-10-CM | POA: Diagnosis not present

## 2015-01-09 DIAGNOSIS — N2581 Secondary hyperparathyroidism of renal origin: Secondary | ICD-10-CM | POA: Diagnosis not present

## 2015-01-09 DIAGNOSIS — Z23 Encounter for immunization: Secondary | ICD-10-CM | POA: Diagnosis not present

## 2015-01-09 DIAGNOSIS — D509 Iron deficiency anemia, unspecified: Secondary | ICD-10-CM | POA: Diagnosis not present

## 2015-01-14 DIAGNOSIS — N2581 Secondary hyperparathyroidism of renal origin: Secondary | ICD-10-CM | POA: Diagnosis not present

## 2015-01-14 DIAGNOSIS — N186 End stage renal disease: Secondary | ICD-10-CM | POA: Diagnosis not present

## 2015-01-14 DIAGNOSIS — Z23 Encounter for immunization: Secondary | ICD-10-CM | POA: Diagnosis not present

## 2015-01-14 DIAGNOSIS — D509 Iron deficiency anemia, unspecified: Secondary | ICD-10-CM | POA: Diagnosis not present

## 2015-01-14 DIAGNOSIS — D631 Anemia in chronic kidney disease: Secondary | ICD-10-CM | POA: Diagnosis not present

## 2015-01-16 DIAGNOSIS — N186 End stage renal disease: Secondary | ICD-10-CM | POA: Diagnosis not present

## 2015-01-16 DIAGNOSIS — N2581 Secondary hyperparathyroidism of renal origin: Secondary | ICD-10-CM | POA: Diagnosis not present

## 2015-01-16 DIAGNOSIS — Z23 Encounter for immunization: Secondary | ICD-10-CM | POA: Diagnosis not present

## 2015-01-16 DIAGNOSIS — D509 Iron deficiency anemia, unspecified: Secondary | ICD-10-CM | POA: Diagnosis not present

## 2015-01-16 DIAGNOSIS — D631 Anemia in chronic kidney disease: Secondary | ICD-10-CM | POA: Diagnosis not present

## 2015-01-20 DIAGNOSIS — M545 Low back pain: Secondary | ICD-10-CM | POA: Diagnosis not present

## 2015-01-20 DIAGNOSIS — N185 Chronic kidney disease, stage 5: Secondary | ICD-10-CM | POA: Diagnosis not present

## 2015-01-20 DIAGNOSIS — N528 Other male erectile dysfunction: Secondary | ICD-10-CM | POA: Diagnosis not present

## 2015-01-21 DIAGNOSIS — D631 Anemia in chronic kidney disease: Secondary | ICD-10-CM | POA: Diagnosis not present

## 2015-01-21 DIAGNOSIS — Z23 Encounter for immunization: Secondary | ICD-10-CM | POA: Diagnosis not present

## 2015-01-21 DIAGNOSIS — N2581 Secondary hyperparathyroidism of renal origin: Secondary | ICD-10-CM | POA: Diagnosis not present

## 2015-01-21 DIAGNOSIS — N186 End stage renal disease: Secondary | ICD-10-CM | POA: Diagnosis not present

## 2015-01-21 DIAGNOSIS — D509 Iron deficiency anemia, unspecified: Secondary | ICD-10-CM | POA: Diagnosis not present

## 2015-01-23 DIAGNOSIS — N2581 Secondary hyperparathyroidism of renal origin: Secondary | ICD-10-CM | POA: Diagnosis not present

## 2015-01-23 DIAGNOSIS — N186 End stage renal disease: Secondary | ICD-10-CM | POA: Diagnosis not present

## 2015-01-23 DIAGNOSIS — Z23 Encounter for immunization: Secondary | ICD-10-CM | POA: Diagnosis not present

## 2015-01-23 DIAGNOSIS — D631 Anemia in chronic kidney disease: Secondary | ICD-10-CM | POA: Diagnosis not present

## 2015-01-23 DIAGNOSIS — D509 Iron deficiency anemia, unspecified: Secondary | ICD-10-CM | POA: Diagnosis not present

## 2015-01-28 DIAGNOSIS — N2581 Secondary hyperparathyroidism of renal origin: Secondary | ICD-10-CM | POA: Diagnosis not present

## 2015-01-28 DIAGNOSIS — Z23 Encounter for immunization: Secondary | ICD-10-CM | POA: Diagnosis not present

## 2015-01-28 DIAGNOSIS — N186 End stage renal disease: Secondary | ICD-10-CM | POA: Diagnosis not present

## 2015-01-28 DIAGNOSIS — D509 Iron deficiency anemia, unspecified: Secondary | ICD-10-CM | POA: Diagnosis not present

## 2015-01-28 DIAGNOSIS — D631 Anemia in chronic kidney disease: Secondary | ICD-10-CM | POA: Diagnosis not present

## 2015-01-30 DIAGNOSIS — N186 End stage renal disease: Secondary | ICD-10-CM | POA: Diagnosis not present

## 2015-01-30 DIAGNOSIS — D631 Anemia in chronic kidney disease: Secondary | ICD-10-CM | POA: Diagnosis not present

## 2015-02-01 DIAGNOSIS — N186 End stage renal disease: Secondary | ICD-10-CM | POA: Diagnosis not present

## 2015-02-01 DIAGNOSIS — Z992 Dependence on renal dialysis: Secondary | ICD-10-CM | POA: Diagnosis not present

## 2015-02-04 DIAGNOSIS — D631 Anemia in chronic kidney disease: Secondary | ICD-10-CM | POA: Diagnosis not present

## 2015-02-04 DIAGNOSIS — N2581 Secondary hyperparathyroidism of renal origin: Secondary | ICD-10-CM | POA: Diagnosis not present

## 2015-02-04 DIAGNOSIS — N186 End stage renal disease: Secondary | ICD-10-CM | POA: Diagnosis not present

## 2015-02-04 DIAGNOSIS — D509 Iron deficiency anemia, unspecified: Secondary | ICD-10-CM | POA: Diagnosis not present

## 2015-02-14 DIAGNOSIS — D631 Anemia in chronic kidney disease: Secondary | ICD-10-CM | POA: Diagnosis not present

## 2015-02-14 DIAGNOSIS — D509 Iron deficiency anemia, unspecified: Secondary | ICD-10-CM | POA: Diagnosis not present

## 2015-02-14 DIAGNOSIS — N2581 Secondary hyperparathyroidism of renal origin: Secondary | ICD-10-CM | POA: Diagnosis not present

## 2015-02-14 DIAGNOSIS — N186 End stage renal disease: Secondary | ICD-10-CM | POA: Diagnosis not present

## 2015-02-18 DIAGNOSIS — D631 Anemia in chronic kidney disease: Secondary | ICD-10-CM | POA: Diagnosis not present

## 2015-02-18 DIAGNOSIS — N186 End stage renal disease: Secondary | ICD-10-CM | POA: Diagnosis not present

## 2015-02-18 DIAGNOSIS — N2581 Secondary hyperparathyroidism of renal origin: Secondary | ICD-10-CM | POA: Diagnosis not present

## 2015-02-18 DIAGNOSIS — D509 Iron deficiency anemia, unspecified: Secondary | ICD-10-CM | POA: Diagnosis not present

## 2015-02-25 DIAGNOSIS — N2581 Secondary hyperparathyroidism of renal origin: Secondary | ICD-10-CM | POA: Diagnosis not present

## 2015-02-25 DIAGNOSIS — D631 Anemia in chronic kidney disease: Secondary | ICD-10-CM | POA: Diagnosis not present

## 2015-02-25 DIAGNOSIS — D509 Iron deficiency anemia, unspecified: Secondary | ICD-10-CM | POA: Diagnosis not present

## 2015-02-25 DIAGNOSIS — N186 End stage renal disease: Secondary | ICD-10-CM | POA: Diagnosis not present

## 2015-02-27 DIAGNOSIS — D631 Anemia in chronic kidney disease: Secondary | ICD-10-CM | POA: Diagnosis not present

## 2015-02-27 DIAGNOSIS — N2581 Secondary hyperparathyroidism of renal origin: Secondary | ICD-10-CM | POA: Diagnosis not present

## 2015-02-27 DIAGNOSIS — D509 Iron deficiency anemia, unspecified: Secondary | ICD-10-CM | POA: Diagnosis not present

## 2015-02-27 DIAGNOSIS — N186 End stage renal disease: Secondary | ICD-10-CM | POA: Diagnosis not present

## 2015-03-04 DIAGNOSIS — Z992 Dependence on renal dialysis: Secondary | ICD-10-CM | POA: Diagnosis not present

## 2015-03-04 DIAGNOSIS — N186 End stage renal disease: Secondary | ICD-10-CM | POA: Diagnosis not present

## 2015-03-05 DIAGNOSIS — D509 Iron deficiency anemia, unspecified: Secondary | ICD-10-CM | POA: Diagnosis not present

## 2015-03-05 DIAGNOSIS — N186 End stage renal disease: Secondary | ICD-10-CM | POA: Diagnosis not present

## 2015-03-05 DIAGNOSIS — D631 Anemia in chronic kidney disease: Secondary | ICD-10-CM | POA: Diagnosis not present

## 2015-03-05 DIAGNOSIS — N2581 Secondary hyperparathyroidism of renal origin: Secondary | ICD-10-CM | POA: Diagnosis not present

## 2015-03-05 DIAGNOSIS — I10 Essential (primary) hypertension: Secondary | ICD-10-CM | POA: Diagnosis not present

## 2015-03-05 DIAGNOSIS — Z23 Encounter for immunization: Secondary | ICD-10-CM | POA: Diagnosis not present

## 2015-03-07 DIAGNOSIS — I10 Essential (primary) hypertension: Secondary | ICD-10-CM | POA: Diagnosis not present

## 2015-03-07 DIAGNOSIS — N2581 Secondary hyperparathyroidism of renal origin: Secondary | ICD-10-CM | POA: Diagnosis not present

## 2015-03-07 DIAGNOSIS — D631 Anemia in chronic kidney disease: Secondary | ICD-10-CM | POA: Diagnosis not present

## 2015-03-07 DIAGNOSIS — Z23 Encounter for immunization: Secondary | ICD-10-CM | POA: Diagnosis not present

## 2015-03-07 DIAGNOSIS — N186 End stage renal disease: Secondary | ICD-10-CM | POA: Diagnosis not present

## 2015-03-07 DIAGNOSIS — D509 Iron deficiency anemia, unspecified: Secondary | ICD-10-CM | POA: Diagnosis not present

## 2015-03-10 DIAGNOSIS — Z23 Encounter for immunization: Secondary | ICD-10-CM | POA: Diagnosis not present

## 2015-03-10 DIAGNOSIS — N2581 Secondary hyperparathyroidism of renal origin: Secondary | ICD-10-CM | POA: Diagnosis not present

## 2015-03-10 DIAGNOSIS — D509 Iron deficiency anemia, unspecified: Secondary | ICD-10-CM | POA: Diagnosis not present

## 2015-03-10 DIAGNOSIS — I10 Essential (primary) hypertension: Secondary | ICD-10-CM | POA: Diagnosis not present

## 2015-03-10 DIAGNOSIS — N186 End stage renal disease: Secondary | ICD-10-CM | POA: Diagnosis not present

## 2015-03-10 DIAGNOSIS — D631 Anemia in chronic kidney disease: Secondary | ICD-10-CM | POA: Diagnosis not present

## 2015-03-14 DIAGNOSIS — Z23 Encounter for immunization: Secondary | ICD-10-CM | POA: Diagnosis not present

## 2015-03-14 DIAGNOSIS — N186 End stage renal disease: Secondary | ICD-10-CM | POA: Diagnosis not present

## 2015-03-14 DIAGNOSIS — I10 Essential (primary) hypertension: Secondary | ICD-10-CM | POA: Diagnosis not present

## 2015-03-14 DIAGNOSIS — N2581 Secondary hyperparathyroidism of renal origin: Secondary | ICD-10-CM | POA: Diagnosis not present

## 2015-03-14 DIAGNOSIS — D509 Iron deficiency anemia, unspecified: Secondary | ICD-10-CM | POA: Diagnosis not present

## 2015-03-14 DIAGNOSIS — D631 Anemia in chronic kidney disease: Secondary | ICD-10-CM | POA: Diagnosis not present

## 2015-03-19 DIAGNOSIS — I10 Essential (primary) hypertension: Secondary | ICD-10-CM | POA: Diagnosis not present

## 2015-03-19 DIAGNOSIS — D631 Anemia in chronic kidney disease: Secondary | ICD-10-CM | POA: Diagnosis not present

## 2015-03-19 DIAGNOSIS — D509 Iron deficiency anemia, unspecified: Secondary | ICD-10-CM | POA: Diagnosis not present

## 2015-03-19 DIAGNOSIS — Z23 Encounter for immunization: Secondary | ICD-10-CM | POA: Diagnosis not present

## 2015-03-19 DIAGNOSIS — N186 End stage renal disease: Secondary | ICD-10-CM | POA: Diagnosis not present

## 2015-03-19 DIAGNOSIS — N2581 Secondary hyperparathyroidism of renal origin: Secondary | ICD-10-CM | POA: Diagnosis not present

## 2015-03-21 DIAGNOSIS — D509 Iron deficiency anemia, unspecified: Secondary | ICD-10-CM | POA: Diagnosis not present

## 2015-03-21 DIAGNOSIS — N2581 Secondary hyperparathyroidism of renal origin: Secondary | ICD-10-CM | POA: Diagnosis not present

## 2015-03-21 DIAGNOSIS — I10 Essential (primary) hypertension: Secondary | ICD-10-CM | POA: Diagnosis not present

## 2015-03-21 DIAGNOSIS — D631 Anemia in chronic kidney disease: Secondary | ICD-10-CM | POA: Diagnosis not present

## 2015-03-21 DIAGNOSIS — Z23 Encounter for immunization: Secondary | ICD-10-CM | POA: Diagnosis not present

## 2015-03-21 DIAGNOSIS — N186 End stage renal disease: Secondary | ICD-10-CM | POA: Diagnosis not present

## 2015-03-24 DIAGNOSIS — I10 Essential (primary) hypertension: Secondary | ICD-10-CM | POA: Diagnosis not present

## 2015-03-24 DIAGNOSIS — N2581 Secondary hyperparathyroidism of renal origin: Secondary | ICD-10-CM | POA: Diagnosis not present

## 2015-03-24 DIAGNOSIS — D509 Iron deficiency anemia, unspecified: Secondary | ICD-10-CM | POA: Diagnosis not present

## 2015-03-24 DIAGNOSIS — Z23 Encounter for immunization: Secondary | ICD-10-CM | POA: Diagnosis not present

## 2015-03-24 DIAGNOSIS — N186 End stage renal disease: Secondary | ICD-10-CM | POA: Diagnosis not present

## 2015-03-24 DIAGNOSIS — D631 Anemia in chronic kidney disease: Secondary | ICD-10-CM | POA: Diagnosis not present

## 2015-03-28 DIAGNOSIS — I10 Essential (primary) hypertension: Secondary | ICD-10-CM | POA: Diagnosis not present

## 2015-03-28 DIAGNOSIS — Z23 Encounter for immunization: Secondary | ICD-10-CM | POA: Diagnosis not present

## 2015-03-28 DIAGNOSIS — D509 Iron deficiency anemia, unspecified: Secondary | ICD-10-CM | POA: Diagnosis not present

## 2015-03-28 DIAGNOSIS — N2581 Secondary hyperparathyroidism of renal origin: Secondary | ICD-10-CM | POA: Diagnosis not present

## 2015-03-28 DIAGNOSIS — D631 Anemia in chronic kidney disease: Secondary | ICD-10-CM | POA: Diagnosis not present

## 2015-03-28 DIAGNOSIS — N186 End stage renal disease: Secondary | ICD-10-CM | POA: Diagnosis not present

## 2015-03-31 DIAGNOSIS — D509 Iron deficiency anemia, unspecified: Secondary | ICD-10-CM | POA: Diagnosis not present

## 2015-03-31 DIAGNOSIS — Z23 Encounter for immunization: Secondary | ICD-10-CM | POA: Diagnosis not present

## 2015-03-31 DIAGNOSIS — I10 Essential (primary) hypertension: Secondary | ICD-10-CM | POA: Diagnosis not present

## 2015-03-31 DIAGNOSIS — N2581 Secondary hyperparathyroidism of renal origin: Secondary | ICD-10-CM | POA: Diagnosis not present

## 2015-03-31 DIAGNOSIS — N186 End stage renal disease: Secondary | ICD-10-CM | POA: Diagnosis not present

## 2015-03-31 DIAGNOSIS — D631 Anemia in chronic kidney disease: Secondary | ICD-10-CM | POA: Diagnosis not present

## 2015-04-03 DIAGNOSIS — N186 End stage renal disease: Secondary | ICD-10-CM | POA: Diagnosis not present

## 2015-04-03 DIAGNOSIS — Z992 Dependence on renal dialysis: Secondary | ICD-10-CM | POA: Diagnosis not present

## 2015-04-04 DIAGNOSIS — N2581 Secondary hyperparathyroidism of renal origin: Secondary | ICD-10-CM | POA: Diagnosis not present

## 2015-04-04 DIAGNOSIS — D509 Iron deficiency anemia, unspecified: Secondary | ICD-10-CM | POA: Diagnosis not present

## 2015-04-04 DIAGNOSIS — D631 Anemia in chronic kidney disease: Secondary | ICD-10-CM | POA: Diagnosis not present

## 2015-04-04 DIAGNOSIS — N186 End stage renal disease: Secondary | ICD-10-CM | POA: Diagnosis not present

## 2015-04-09 DIAGNOSIS — N186 End stage renal disease: Secondary | ICD-10-CM | POA: Diagnosis not present

## 2015-04-09 DIAGNOSIS — E1065 Type 1 diabetes mellitus with hyperglycemia: Secondary | ICD-10-CM | POA: Diagnosis not present

## 2015-04-09 DIAGNOSIS — N2581 Secondary hyperparathyroidism of renal origin: Secondary | ICD-10-CM | POA: Diagnosis not present

## 2015-04-09 DIAGNOSIS — D631 Anemia in chronic kidney disease: Secondary | ICD-10-CM | POA: Diagnosis not present

## 2015-04-09 DIAGNOSIS — D509 Iron deficiency anemia, unspecified: Secondary | ICD-10-CM | POA: Diagnosis not present

## 2015-04-16 DIAGNOSIS — D509 Iron deficiency anemia, unspecified: Secondary | ICD-10-CM | POA: Diagnosis not present

## 2015-04-16 DIAGNOSIS — N186 End stage renal disease: Secondary | ICD-10-CM | POA: Diagnosis not present

## 2015-04-16 DIAGNOSIS — N2581 Secondary hyperparathyroidism of renal origin: Secondary | ICD-10-CM | POA: Diagnosis not present

## 2015-04-16 DIAGNOSIS — D631 Anemia in chronic kidney disease: Secondary | ICD-10-CM | POA: Diagnosis not present

## 2015-04-18 DIAGNOSIS — D631 Anemia in chronic kidney disease: Secondary | ICD-10-CM | POA: Diagnosis not present

## 2015-04-18 DIAGNOSIS — N186 End stage renal disease: Secondary | ICD-10-CM | POA: Diagnosis not present

## 2015-04-18 DIAGNOSIS — D509 Iron deficiency anemia, unspecified: Secondary | ICD-10-CM | POA: Diagnosis not present

## 2015-04-18 DIAGNOSIS — N2581 Secondary hyperparathyroidism of renal origin: Secondary | ICD-10-CM | POA: Diagnosis not present

## 2015-04-25 DIAGNOSIS — D631 Anemia in chronic kidney disease: Secondary | ICD-10-CM | POA: Diagnosis not present

## 2015-04-25 DIAGNOSIS — N186 End stage renal disease: Secondary | ICD-10-CM | POA: Diagnosis not present

## 2015-04-25 DIAGNOSIS — N2581 Secondary hyperparathyroidism of renal origin: Secondary | ICD-10-CM | POA: Diagnosis not present

## 2015-04-25 DIAGNOSIS — D509 Iron deficiency anemia, unspecified: Secondary | ICD-10-CM | POA: Diagnosis not present

## 2015-04-30 DIAGNOSIS — D509 Iron deficiency anemia, unspecified: Secondary | ICD-10-CM | POA: Diagnosis not present

## 2015-04-30 DIAGNOSIS — N2581 Secondary hyperparathyroidism of renal origin: Secondary | ICD-10-CM | POA: Diagnosis not present

## 2015-04-30 DIAGNOSIS — D631 Anemia in chronic kidney disease: Secondary | ICD-10-CM | POA: Diagnosis not present

## 2015-04-30 DIAGNOSIS — N186 End stage renal disease: Secondary | ICD-10-CM | POA: Diagnosis not present

## 2015-05-04 DIAGNOSIS — Z992 Dependence on renal dialysis: Secondary | ICD-10-CM | POA: Diagnosis not present

## 2015-05-04 DIAGNOSIS — N186 End stage renal disease: Secondary | ICD-10-CM | POA: Diagnosis not present

## 2015-05-05 DIAGNOSIS — N186 End stage renal disease: Secondary | ICD-10-CM | POA: Diagnosis not present

## 2015-05-05 DIAGNOSIS — D631 Anemia in chronic kidney disease: Secondary | ICD-10-CM | POA: Diagnosis not present

## 2015-05-05 DIAGNOSIS — N2581 Secondary hyperparathyroidism of renal origin: Secondary | ICD-10-CM | POA: Diagnosis not present

## 2015-05-05 DIAGNOSIS — D509 Iron deficiency anemia, unspecified: Secondary | ICD-10-CM | POA: Diagnosis not present

## 2015-05-09 DIAGNOSIS — N2581 Secondary hyperparathyroidism of renal origin: Secondary | ICD-10-CM | POA: Diagnosis not present

## 2015-05-09 DIAGNOSIS — D509 Iron deficiency anemia, unspecified: Secondary | ICD-10-CM | POA: Diagnosis not present

## 2015-05-09 DIAGNOSIS — D631 Anemia in chronic kidney disease: Secondary | ICD-10-CM | POA: Diagnosis not present

## 2015-05-09 DIAGNOSIS — N186 End stage renal disease: Secondary | ICD-10-CM | POA: Diagnosis not present

## 2015-05-12 DIAGNOSIS — I120 Hypertensive chronic kidney disease with stage 5 chronic kidney disease or end stage renal disease: Secondary | ICD-10-CM | POA: Diagnosis not present

## 2015-05-12 DIAGNOSIS — M545 Low back pain: Secondary | ICD-10-CM | POA: Diagnosis not present

## 2015-05-12 DIAGNOSIS — F900 Attention-deficit hyperactivity disorder, predominantly inattentive type: Secondary | ICD-10-CM | POA: Diagnosis not present

## 2015-05-14 DIAGNOSIS — D631 Anemia in chronic kidney disease: Secondary | ICD-10-CM | POA: Diagnosis not present

## 2015-05-14 DIAGNOSIS — N186 End stage renal disease: Secondary | ICD-10-CM | POA: Diagnosis not present

## 2015-05-14 DIAGNOSIS — N2581 Secondary hyperparathyroidism of renal origin: Secondary | ICD-10-CM | POA: Diagnosis not present

## 2015-05-14 DIAGNOSIS — D509 Iron deficiency anemia, unspecified: Secondary | ICD-10-CM | POA: Diagnosis not present

## 2015-05-19 DIAGNOSIS — D631 Anemia in chronic kidney disease: Secondary | ICD-10-CM | POA: Diagnosis not present

## 2015-05-19 DIAGNOSIS — D509 Iron deficiency anemia, unspecified: Secondary | ICD-10-CM | POA: Diagnosis not present

## 2015-05-19 DIAGNOSIS — N186 End stage renal disease: Secondary | ICD-10-CM | POA: Diagnosis not present

## 2015-05-19 DIAGNOSIS — N2581 Secondary hyperparathyroidism of renal origin: Secondary | ICD-10-CM | POA: Diagnosis not present

## 2015-05-23 DIAGNOSIS — D631 Anemia in chronic kidney disease: Secondary | ICD-10-CM | POA: Diagnosis not present

## 2015-05-23 DIAGNOSIS — N186 End stage renal disease: Secondary | ICD-10-CM | POA: Diagnosis not present

## 2015-05-23 DIAGNOSIS — N2581 Secondary hyperparathyroidism of renal origin: Secondary | ICD-10-CM | POA: Diagnosis not present

## 2015-05-23 DIAGNOSIS — D509 Iron deficiency anemia, unspecified: Secondary | ICD-10-CM | POA: Diagnosis not present

## 2015-05-31 DIAGNOSIS — N186 End stage renal disease: Secondary | ICD-10-CM | POA: Diagnosis not present

## 2015-05-31 DIAGNOSIS — D631 Anemia in chronic kidney disease: Secondary | ICD-10-CM | POA: Diagnosis not present

## 2015-05-31 DIAGNOSIS — D509 Iron deficiency anemia, unspecified: Secondary | ICD-10-CM | POA: Diagnosis not present

## 2015-05-31 DIAGNOSIS — N2581 Secondary hyperparathyroidism of renal origin: Secondary | ICD-10-CM | POA: Diagnosis not present

## 2015-06-04 DIAGNOSIS — N186 End stage renal disease: Secondary | ICD-10-CM | POA: Diagnosis not present

## 2015-06-04 DIAGNOSIS — Z992 Dependence on renal dialysis: Secondary | ICD-10-CM | POA: Diagnosis not present

## 2015-06-04 DIAGNOSIS — D509 Iron deficiency anemia, unspecified: Secondary | ICD-10-CM | POA: Diagnosis not present

## 2015-06-04 DIAGNOSIS — N2581 Secondary hyperparathyroidism of renal origin: Secondary | ICD-10-CM | POA: Diagnosis not present

## 2015-06-04 DIAGNOSIS — D631 Anemia in chronic kidney disease: Secondary | ICD-10-CM | POA: Diagnosis not present

## 2015-06-13 DIAGNOSIS — D631 Anemia in chronic kidney disease: Secondary | ICD-10-CM | POA: Diagnosis not present

## 2015-06-13 DIAGNOSIS — N2581 Secondary hyperparathyroidism of renal origin: Secondary | ICD-10-CM | POA: Diagnosis not present

## 2015-06-13 DIAGNOSIS — D509 Iron deficiency anemia, unspecified: Secondary | ICD-10-CM | POA: Diagnosis not present

## 2015-06-13 DIAGNOSIS — N186 End stage renal disease: Secondary | ICD-10-CM | POA: Diagnosis not present

## 2015-06-24 DIAGNOSIS — N186 End stage renal disease: Secondary | ICD-10-CM | POA: Diagnosis not present

## 2015-06-24 DIAGNOSIS — D509 Iron deficiency anemia, unspecified: Secondary | ICD-10-CM | POA: Diagnosis not present

## 2015-06-24 DIAGNOSIS — N2581 Secondary hyperparathyroidism of renal origin: Secondary | ICD-10-CM | POA: Diagnosis not present

## 2015-06-24 DIAGNOSIS — D631 Anemia in chronic kidney disease: Secondary | ICD-10-CM | POA: Diagnosis not present

## 2015-07-01 DIAGNOSIS — D509 Iron deficiency anemia, unspecified: Secondary | ICD-10-CM | POA: Diagnosis not present

## 2015-07-01 DIAGNOSIS — N2581 Secondary hyperparathyroidism of renal origin: Secondary | ICD-10-CM | POA: Diagnosis not present

## 2015-07-01 DIAGNOSIS — N186 End stage renal disease: Secondary | ICD-10-CM | POA: Diagnosis not present

## 2015-07-01 DIAGNOSIS — D631 Anemia in chronic kidney disease: Secondary | ICD-10-CM | POA: Diagnosis not present

## 2015-07-04 DIAGNOSIS — N186 End stage renal disease: Secondary | ICD-10-CM | POA: Diagnosis not present

## 2015-07-04 DIAGNOSIS — Z992 Dependence on renal dialysis: Secondary | ICD-10-CM | POA: Diagnosis not present

## 2015-07-08 DIAGNOSIS — D631 Anemia in chronic kidney disease: Secondary | ICD-10-CM | POA: Diagnosis not present

## 2015-07-08 DIAGNOSIS — N186 End stage renal disease: Secondary | ICD-10-CM | POA: Diagnosis not present

## 2015-07-08 DIAGNOSIS — D509 Iron deficiency anemia, unspecified: Secondary | ICD-10-CM | POA: Diagnosis not present

## 2015-07-08 DIAGNOSIS — N2581 Secondary hyperparathyroidism of renal origin: Secondary | ICD-10-CM | POA: Diagnosis not present

## 2015-07-14 DIAGNOSIS — E1065 Type 1 diabetes mellitus with hyperglycemia: Secondary | ICD-10-CM | POA: Diagnosis not present

## 2015-07-15 DIAGNOSIS — N2581 Secondary hyperparathyroidism of renal origin: Secondary | ICD-10-CM | POA: Diagnosis not present

## 2015-07-15 DIAGNOSIS — D509 Iron deficiency anemia, unspecified: Secondary | ICD-10-CM | POA: Diagnosis not present

## 2015-07-15 DIAGNOSIS — N186 End stage renal disease: Secondary | ICD-10-CM | POA: Diagnosis not present

## 2015-07-15 DIAGNOSIS — D631 Anemia in chronic kidney disease: Secondary | ICD-10-CM | POA: Diagnosis not present

## 2015-07-22 DIAGNOSIS — N186 End stage renal disease: Secondary | ICD-10-CM | POA: Diagnosis not present

## 2015-07-22 DIAGNOSIS — M5416 Radiculopathy, lumbar region: Secondary | ICD-10-CM | POA: Diagnosis not present

## 2015-07-22 DIAGNOSIS — D509 Iron deficiency anemia, unspecified: Secondary | ICD-10-CM | POA: Diagnosis not present

## 2015-07-22 DIAGNOSIS — D631 Anemia in chronic kidney disease: Secondary | ICD-10-CM | POA: Diagnosis not present

## 2015-07-22 DIAGNOSIS — F419 Anxiety disorder, unspecified: Secondary | ICD-10-CM | POA: Diagnosis not present

## 2015-07-22 DIAGNOSIS — N2581 Secondary hyperparathyroidism of renal origin: Secondary | ICD-10-CM | POA: Diagnosis not present

## 2015-07-22 DIAGNOSIS — M545 Low back pain: Secondary | ICD-10-CM | POA: Diagnosis not present

## 2015-07-22 DIAGNOSIS — F900 Attention-deficit hyperactivity disorder, predominantly inattentive type: Secondary | ICD-10-CM | POA: Diagnosis not present

## 2015-07-29 DIAGNOSIS — N2581 Secondary hyperparathyroidism of renal origin: Secondary | ICD-10-CM | POA: Diagnosis not present

## 2015-07-29 DIAGNOSIS — D631 Anemia in chronic kidney disease: Secondary | ICD-10-CM | POA: Diagnosis not present

## 2015-07-29 DIAGNOSIS — N186 End stage renal disease: Secondary | ICD-10-CM | POA: Diagnosis not present

## 2015-07-29 DIAGNOSIS — D509 Iron deficiency anemia, unspecified: Secondary | ICD-10-CM | POA: Diagnosis not present

## 2015-08-04 DIAGNOSIS — N186 End stage renal disease: Secondary | ICD-10-CM | POA: Diagnosis not present

## 2015-08-04 DIAGNOSIS — Z992 Dependence on renal dialysis: Secondary | ICD-10-CM | POA: Diagnosis not present

## 2015-08-05 DIAGNOSIS — D509 Iron deficiency anemia, unspecified: Secondary | ICD-10-CM | POA: Diagnosis not present

## 2015-08-05 DIAGNOSIS — N2581 Secondary hyperparathyroidism of renal origin: Secondary | ICD-10-CM | POA: Diagnosis not present

## 2015-08-05 DIAGNOSIS — N186 End stage renal disease: Secondary | ICD-10-CM | POA: Diagnosis not present

## 2015-08-05 DIAGNOSIS — Z23 Encounter for immunization: Secondary | ICD-10-CM | POA: Diagnosis not present

## 2015-08-05 DIAGNOSIS — D631 Anemia in chronic kidney disease: Secondary | ICD-10-CM | POA: Diagnosis not present

## 2015-08-09 DIAGNOSIS — D509 Iron deficiency anemia, unspecified: Secondary | ICD-10-CM | POA: Diagnosis not present

## 2015-08-09 DIAGNOSIS — N186 End stage renal disease: Secondary | ICD-10-CM | POA: Diagnosis not present

## 2015-08-09 DIAGNOSIS — Z23 Encounter for immunization: Secondary | ICD-10-CM | POA: Diagnosis not present

## 2015-08-09 DIAGNOSIS — N2581 Secondary hyperparathyroidism of renal origin: Secondary | ICD-10-CM | POA: Diagnosis not present

## 2015-08-09 DIAGNOSIS — D631 Anemia in chronic kidney disease: Secondary | ICD-10-CM | POA: Diagnosis not present

## 2015-08-19 DIAGNOSIS — D509 Iron deficiency anemia, unspecified: Secondary | ICD-10-CM | POA: Diagnosis not present

## 2015-08-19 DIAGNOSIS — Z23 Encounter for immunization: Secondary | ICD-10-CM | POA: Diagnosis not present

## 2015-08-19 DIAGNOSIS — N2581 Secondary hyperparathyroidism of renal origin: Secondary | ICD-10-CM | POA: Diagnosis not present

## 2015-08-19 DIAGNOSIS — D631 Anemia in chronic kidney disease: Secondary | ICD-10-CM | POA: Diagnosis not present

## 2015-08-19 DIAGNOSIS — N186 End stage renal disease: Secondary | ICD-10-CM | POA: Diagnosis not present

## 2015-08-26 DIAGNOSIS — D509 Iron deficiency anemia, unspecified: Secondary | ICD-10-CM | POA: Diagnosis not present

## 2015-08-26 DIAGNOSIS — D631 Anemia in chronic kidney disease: Secondary | ICD-10-CM | POA: Diagnosis not present

## 2015-08-26 DIAGNOSIS — N186 End stage renal disease: Secondary | ICD-10-CM | POA: Diagnosis not present

## 2015-08-26 DIAGNOSIS — Z23 Encounter for immunization: Secondary | ICD-10-CM | POA: Diagnosis not present

## 2015-08-26 DIAGNOSIS — N2581 Secondary hyperparathyroidism of renal origin: Secondary | ICD-10-CM | POA: Diagnosis not present

## 2015-09-03 DIAGNOSIS — Z992 Dependence on renal dialysis: Secondary | ICD-10-CM | POA: Diagnosis not present

## 2015-09-03 DIAGNOSIS — N186 End stage renal disease: Secondary | ICD-10-CM | POA: Diagnosis not present

## 2015-09-04 DIAGNOSIS — N2581 Secondary hyperparathyroidism of renal origin: Secondary | ICD-10-CM | POA: Diagnosis not present

## 2015-09-04 DIAGNOSIS — N186 End stage renal disease: Secondary | ICD-10-CM | POA: Diagnosis not present

## 2015-09-04 DIAGNOSIS — D509 Iron deficiency anemia, unspecified: Secondary | ICD-10-CM | POA: Diagnosis not present

## 2015-09-04 DIAGNOSIS — D631 Anemia in chronic kidney disease: Secondary | ICD-10-CM | POA: Diagnosis not present

## 2015-09-11 DIAGNOSIS — D509 Iron deficiency anemia, unspecified: Secondary | ICD-10-CM | POA: Diagnosis not present

## 2015-09-11 DIAGNOSIS — N2581 Secondary hyperparathyroidism of renal origin: Secondary | ICD-10-CM | POA: Diagnosis not present

## 2015-09-11 DIAGNOSIS — D631 Anemia in chronic kidney disease: Secondary | ICD-10-CM | POA: Diagnosis not present

## 2015-09-11 DIAGNOSIS — N186 End stage renal disease: Secondary | ICD-10-CM | POA: Diagnosis not present

## 2015-09-23 DIAGNOSIS — N2581 Secondary hyperparathyroidism of renal origin: Secondary | ICD-10-CM | POA: Diagnosis not present

## 2015-09-23 DIAGNOSIS — D509 Iron deficiency anemia, unspecified: Secondary | ICD-10-CM | POA: Diagnosis not present

## 2015-09-23 DIAGNOSIS — D631 Anemia in chronic kidney disease: Secondary | ICD-10-CM | POA: Diagnosis not present

## 2015-09-23 DIAGNOSIS — N186 End stage renal disease: Secondary | ICD-10-CM | POA: Diagnosis not present

## 2015-09-30 DIAGNOSIS — D631 Anemia in chronic kidney disease: Secondary | ICD-10-CM | POA: Diagnosis not present

## 2015-09-30 DIAGNOSIS — N186 End stage renal disease: Secondary | ICD-10-CM | POA: Diagnosis not present

## 2015-09-30 DIAGNOSIS — N2581 Secondary hyperparathyroidism of renal origin: Secondary | ICD-10-CM | POA: Diagnosis not present

## 2015-09-30 DIAGNOSIS — D509 Iron deficiency anemia, unspecified: Secondary | ICD-10-CM | POA: Diagnosis not present

## 2015-10-04 DIAGNOSIS — Z992 Dependence on renal dialysis: Secondary | ICD-10-CM | POA: Diagnosis not present

## 2015-10-04 DIAGNOSIS — N186 End stage renal disease: Secondary | ICD-10-CM | POA: Diagnosis not present

## 2015-10-09 DIAGNOSIS — E1065 Type 1 diabetes mellitus with hyperglycemia: Secondary | ICD-10-CM | POA: Diagnosis not present

## 2015-10-09 DIAGNOSIS — Z23 Encounter for immunization: Secondary | ICD-10-CM | POA: Diagnosis not present

## 2015-10-09 DIAGNOSIS — N2581 Secondary hyperparathyroidism of renal origin: Secondary | ICD-10-CM | POA: Diagnosis not present

## 2015-10-09 DIAGNOSIS — N186 End stage renal disease: Secondary | ICD-10-CM | POA: Diagnosis not present

## 2015-10-09 DIAGNOSIS — D509 Iron deficiency anemia, unspecified: Secondary | ICD-10-CM | POA: Diagnosis not present

## 2015-10-09 DIAGNOSIS — D631 Anemia in chronic kidney disease: Secondary | ICD-10-CM | POA: Diagnosis not present

## 2015-10-21 DIAGNOSIS — D631 Anemia in chronic kidney disease: Secondary | ICD-10-CM | POA: Diagnosis not present

## 2015-10-21 DIAGNOSIS — N2581 Secondary hyperparathyroidism of renal origin: Secondary | ICD-10-CM | POA: Diagnosis not present

## 2015-10-21 DIAGNOSIS — Z23 Encounter for immunization: Secondary | ICD-10-CM | POA: Diagnosis not present

## 2015-10-21 DIAGNOSIS — N186 End stage renal disease: Secondary | ICD-10-CM | POA: Diagnosis not present

## 2015-10-21 DIAGNOSIS — D509 Iron deficiency anemia, unspecified: Secondary | ICD-10-CM | POA: Diagnosis not present

## 2015-10-22 DIAGNOSIS — M545 Low back pain: Secondary | ICD-10-CM | POA: Diagnosis not present

## 2015-10-22 DIAGNOSIS — N185 Chronic kidney disease, stage 5: Secondary | ICD-10-CM | POA: Diagnosis not present

## 2015-10-22 DIAGNOSIS — I120 Hypertensive chronic kidney disease with stage 5 chronic kidney disease or end stage renal disease: Secondary | ICD-10-CM | POA: Diagnosis not present

## 2015-10-22 DIAGNOSIS — F419 Anxiety disorder, unspecified: Secondary | ICD-10-CM | POA: Diagnosis not present

## 2015-10-22 DIAGNOSIS — D631 Anemia in chronic kidney disease: Secondary | ICD-10-CM | POA: Diagnosis not present

## 2015-10-22 DIAGNOSIS — N2581 Secondary hyperparathyroidism of renal origin: Secondary | ICD-10-CM | POA: Diagnosis not present

## 2015-10-22 DIAGNOSIS — N529 Male erectile dysfunction, unspecified: Secondary | ICD-10-CM | POA: Diagnosis not present

## 2015-10-22 DIAGNOSIS — F900 Attention-deficit hyperactivity disorder, predominantly inattentive type: Secondary | ICD-10-CM | POA: Diagnosis not present

## 2015-11-04 DIAGNOSIS — N2581 Secondary hyperparathyroidism of renal origin: Secondary | ICD-10-CM | POA: Diagnosis not present

## 2015-11-04 DIAGNOSIS — N186 End stage renal disease: Secondary | ICD-10-CM | POA: Diagnosis not present

## 2015-11-04 DIAGNOSIS — Z23 Encounter for immunization: Secondary | ICD-10-CM | POA: Diagnosis not present

## 2015-11-04 DIAGNOSIS — D631 Anemia in chronic kidney disease: Secondary | ICD-10-CM | POA: Diagnosis not present

## 2015-11-04 DIAGNOSIS — Z992 Dependence on renal dialysis: Secondary | ICD-10-CM | POA: Diagnosis not present

## 2015-11-04 DIAGNOSIS — D509 Iron deficiency anemia, unspecified: Secondary | ICD-10-CM | POA: Diagnosis not present

## 2015-11-13 DIAGNOSIS — D509 Iron deficiency anemia, unspecified: Secondary | ICD-10-CM | POA: Diagnosis not present

## 2015-11-13 DIAGNOSIS — N2581 Secondary hyperparathyroidism of renal origin: Secondary | ICD-10-CM | POA: Diagnosis not present

## 2015-11-13 DIAGNOSIS — N186 End stage renal disease: Secondary | ICD-10-CM | POA: Diagnosis not present

## 2015-11-13 DIAGNOSIS — D631 Anemia in chronic kidney disease: Secondary | ICD-10-CM | POA: Diagnosis not present

## 2015-11-25 DIAGNOSIS — D509 Iron deficiency anemia, unspecified: Secondary | ICD-10-CM | POA: Diagnosis not present

## 2015-11-25 DIAGNOSIS — N2581 Secondary hyperparathyroidism of renal origin: Secondary | ICD-10-CM | POA: Diagnosis not present

## 2015-11-25 DIAGNOSIS — D631 Anemia in chronic kidney disease: Secondary | ICD-10-CM | POA: Diagnosis not present

## 2015-11-25 DIAGNOSIS — N186 End stage renal disease: Secondary | ICD-10-CM | POA: Diagnosis not present

## 2015-12-04 DIAGNOSIS — D631 Anemia in chronic kidney disease: Secondary | ICD-10-CM | POA: Diagnosis not present

## 2015-12-04 DIAGNOSIS — N186 End stage renal disease: Secondary | ICD-10-CM | POA: Diagnosis not present

## 2015-12-04 DIAGNOSIS — D509 Iron deficiency anemia, unspecified: Secondary | ICD-10-CM | POA: Diagnosis not present

## 2015-12-04 DIAGNOSIS — N2581 Secondary hyperparathyroidism of renal origin: Secondary | ICD-10-CM | POA: Diagnosis not present

## 2015-12-13 DIAGNOSIS — D631 Anemia in chronic kidney disease: Secondary | ICD-10-CM | POA: Diagnosis not present

## 2015-12-13 DIAGNOSIS — N2581 Secondary hyperparathyroidism of renal origin: Secondary | ICD-10-CM | POA: Diagnosis not present

## 2015-12-13 DIAGNOSIS — D509 Iron deficiency anemia, unspecified: Secondary | ICD-10-CM | POA: Diagnosis not present

## 2015-12-13 DIAGNOSIS — N186 End stage renal disease: Secondary | ICD-10-CM | POA: Diagnosis not present

## 2015-12-25 DIAGNOSIS — D631 Anemia in chronic kidney disease: Secondary | ICD-10-CM | POA: Diagnosis not present

## 2015-12-25 DIAGNOSIS — N186 End stage renal disease: Secondary | ICD-10-CM | POA: Diagnosis not present

## 2015-12-25 DIAGNOSIS — N2581 Secondary hyperparathyroidism of renal origin: Secondary | ICD-10-CM | POA: Diagnosis not present

## 2015-12-25 DIAGNOSIS — D509 Iron deficiency anemia, unspecified: Secondary | ICD-10-CM | POA: Diagnosis not present

## 2016-01-01 DIAGNOSIS — N186 End stage renal disease: Secondary | ICD-10-CM | POA: Diagnosis not present

## 2016-01-01 DIAGNOSIS — D509 Iron deficiency anemia, unspecified: Secondary | ICD-10-CM | POA: Diagnosis not present

## 2016-01-01 DIAGNOSIS — D631 Anemia in chronic kidney disease: Secondary | ICD-10-CM | POA: Diagnosis not present

## 2016-01-01 DIAGNOSIS — N2581 Secondary hyperparathyroidism of renal origin: Secondary | ICD-10-CM | POA: Diagnosis not present

## 2016-01-02 DIAGNOSIS — Z992 Dependence on renal dialysis: Secondary | ICD-10-CM | POA: Diagnosis not present

## 2016-01-02 DIAGNOSIS — N186 End stage renal disease: Secondary | ICD-10-CM | POA: Diagnosis not present

## 2016-01-13 DIAGNOSIS — N2581 Secondary hyperparathyroidism of renal origin: Secondary | ICD-10-CM | POA: Diagnosis not present

## 2016-01-13 DIAGNOSIS — E039 Hypothyroidism, unspecified: Secondary | ICD-10-CM | POA: Diagnosis not present

## 2016-01-13 DIAGNOSIS — D631 Anemia in chronic kidney disease: Secondary | ICD-10-CM | POA: Diagnosis not present

## 2016-01-13 DIAGNOSIS — N186 End stage renal disease: Secondary | ICD-10-CM | POA: Diagnosis not present

## 2016-01-13 DIAGNOSIS — E162 Hypoglycemia, unspecified: Secondary | ICD-10-CM | POA: Diagnosis not present

## 2016-01-27 DIAGNOSIS — N186 End stage renal disease: Secondary | ICD-10-CM | POA: Diagnosis not present

## 2016-01-27 DIAGNOSIS — D631 Anemia in chronic kidney disease: Secondary | ICD-10-CM | POA: Diagnosis not present

## 2016-01-27 DIAGNOSIS — N2581 Secondary hyperparathyroidism of renal origin: Secondary | ICD-10-CM | POA: Diagnosis not present

## 2016-01-29 DIAGNOSIS — I120 Hypertensive chronic kidney disease with stage 5 chronic kidney disease or end stage renal disease: Secondary | ICD-10-CM | POA: Diagnosis not present

## 2016-01-29 DIAGNOSIS — Z79899 Other long term (current) drug therapy: Secondary | ICD-10-CM | POA: Diagnosis not present

## 2016-01-29 DIAGNOSIS — N2581 Secondary hyperparathyroidism of renal origin: Secondary | ICD-10-CM | POA: Diagnosis not present

## 2016-01-29 DIAGNOSIS — D649 Anemia, unspecified: Secondary | ICD-10-CM | POA: Diagnosis not present

## 2016-01-29 DIAGNOSIS — N186 End stage renal disease: Secondary | ICD-10-CM | POA: Diagnosis not present

## 2016-02-01 DIAGNOSIS — Z992 Dependence on renal dialysis: Secondary | ICD-10-CM | POA: Diagnosis not present

## 2016-02-01 DIAGNOSIS — N186 End stage renal disease: Secondary | ICD-10-CM | POA: Diagnosis not present

## 2016-02-05 DIAGNOSIS — N2581 Secondary hyperparathyroidism of renal origin: Secondary | ICD-10-CM | POA: Diagnosis not present

## 2016-02-05 DIAGNOSIS — D631 Anemia in chronic kidney disease: Secondary | ICD-10-CM | POA: Diagnosis not present

## 2016-02-05 DIAGNOSIS — N186 End stage renal disease: Secondary | ICD-10-CM | POA: Diagnosis not present

## 2016-02-10 DIAGNOSIS — D631 Anemia in chronic kidney disease: Secondary | ICD-10-CM | POA: Diagnosis not present

## 2016-02-10 DIAGNOSIS — N186 End stage renal disease: Secondary | ICD-10-CM | POA: Diagnosis not present

## 2016-02-10 DIAGNOSIS — N2581 Secondary hyperparathyroidism of renal origin: Secondary | ICD-10-CM | POA: Diagnosis not present

## 2016-02-24 DIAGNOSIS — D631 Anemia in chronic kidney disease: Secondary | ICD-10-CM | POA: Diagnosis not present

## 2016-02-24 DIAGNOSIS — N186 End stage renal disease: Secondary | ICD-10-CM | POA: Diagnosis not present

## 2016-02-24 DIAGNOSIS — N2581 Secondary hyperparathyroidism of renal origin: Secondary | ICD-10-CM | POA: Diagnosis not present

## 2016-02-26 DIAGNOSIS — N186 End stage renal disease: Secondary | ICD-10-CM | POA: Diagnosis not present

## 2016-02-26 DIAGNOSIS — D631 Anemia in chronic kidney disease: Secondary | ICD-10-CM | POA: Diagnosis not present

## 2016-02-26 DIAGNOSIS — N2581 Secondary hyperparathyroidism of renal origin: Secondary | ICD-10-CM | POA: Diagnosis not present

## 2016-03-02 DIAGNOSIS — D631 Anemia in chronic kidney disease: Secondary | ICD-10-CM | POA: Diagnosis not present

## 2016-03-02 DIAGNOSIS — N186 End stage renal disease: Secondary | ICD-10-CM | POA: Diagnosis not present

## 2016-03-02 DIAGNOSIS — N2581 Secondary hyperparathyroidism of renal origin: Secondary | ICD-10-CM | POA: Diagnosis not present

## 2016-03-03 DIAGNOSIS — N186 End stage renal disease: Secondary | ICD-10-CM | POA: Diagnosis not present

## 2016-03-03 DIAGNOSIS — Z992 Dependence on renal dialysis: Secondary | ICD-10-CM | POA: Diagnosis not present

## 2016-03-06 DIAGNOSIS — N2581 Secondary hyperparathyroidism of renal origin: Secondary | ICD-10-CM | POA: Diagnosis not present

## 2016-03-06 DIAGNOSIS — D631 Anemia in chronic kidney disease: Secondary | ICD-10-CM | POA: Diagnosis not present

## 2016-03-06 DIAGNOSIS — N186 End stage renal disease: Secondary | ICD-10-CM | POA: Diagnosis not present

## 2016-03-12 DIAGNOSIS — N186 End stage renal disease: Secondary | ICD-10-CM | POA: Diagnosis not present

## 2016-03-12 DIAGNOSIS — D631 Anemia in chronic kidney disease: Secondary | ICD-10-CM | POA: Diagnosis not present

## 2016-03-12 DIAGNOSIS — N2581 Secondary hyperparathyroidism of renal origin: Secondary | ICD-10-CM | POA: Diagnosis not present

## 2016-03-16 DIAGNOSIS — D631 Anemia in chronic kidney disease: Secondary | ICD-10-CM | POA: Diagnosis not present

## 2016-03-16 DIAGNOSIS — N2581 Secondary hyperparathyroidism of renal origin: Secondary | ICD-10-CM | POA: Diagnosis not present

## 2016-03-16 DIAGNOSIS — N186 End stage renal disease: Secondary | ICD-10-CM | POA: Diagnosis not present

## 2016-03-18 DIAGNOSIS — N185 Chronic kidney disease, stage 5: Secondary | ICD-10-CM | POA: Diagnosis not present

## 2016-03-18 DIAGNOSIS — M545 Low back pain: Secondary | ICD-10-CM | POA: Diagnosis not present

## 2016-03-18 DIAGNOSIS — D631 Anemia in chronic kidney disease: Secondary | ICD-10-CM | POA: Diagnosis not present

## 2016-03-18 DIAGNOSIS — F900 Attention-deficit hyperactivity disorder, predominantly inattentive type: Secondary | ICD-10-CM | POA: Diagnosis not present

## 2016-03-18 DIAGNOSIS — F419 Anxiety disorder, unspecified: Secondary | ICD-10-CM | POA: Diagnosis not present

## 2016-03-18 DIAGNOSIS — I120 Hypertensive chronic kidney disease with stage 5 chronic kidney disease or end stage renal disease: Secondary | ICD-10-CM | POA: Diagnosis not present

## 2016-03-18 DIAGNOSIS — N2581 Secondary hyperparathyroidism of renal origin: Secondary | ICD-10-CM | POA: Diagnosis not present

## 2016-03-18 DIAGNOSIS — N529 Male erectile dysfunction, unspecified: Secondary | ICD-10-CM | POA: Diagnosis not present

## 2016-03-23 DIAGNOSIS — N186 End stage renal disease: Secondary | ICD-10-CM | POA: Diagnosis not present

## 2016-03-23 DIAGNOSIS — D631 Anemia in chronic kidney disease: Secondary | ICD-10-CM | POA: Diagnosis not present

## 2016-03-23 DIAGNOSIS — N2581 Secondary hyperparathyroidism of renal origin: Secondary | ICD-10-CM | POA: Diagnosis not present

## 2016-03-30 DIAGNOSIS — D631 Anemia in chronic kidney disease: Secondary | ICD-10-CM | POA: Diagnosis not present

## 2016-03-30 DIAGNOSIS — N2581 Secondary hyperparathyroidism of renal origin: Secondary | ICD-10-CM | POA: Diagnosis not present

## 2016-03-30 DIAGNOSIS — N186 End stage renal disease: Secondary | ICD-10-CM | POA: Diagnosis not present

## 2016-04-01 DIAGNOSIS — N186 End stage renal disease: Secondary | ICD-10-CM | POA: Diagnosis not present

## 2016-04-01 DIAGNOSIS — D631 Anemia in chronic kidney disease: Secondary | ICD-10-CM | POA: Diagnosis not present

## 2016-04-01 DIAGNOSIS — N2581 Secondary hyperparathyroidism of renal origin: Secondary | ICD-10-CM | POA: Diagnosis not present

## 2016-04-02 DIAGNOSIS — Z992 Dependence on renal dialysis: Secondary | ICD-10-CM | POA: Diagnosis not present

## 2016-04-02 DIAGNOSIS — N186 End stage renal disease: Secondary | ICD-10-CM | POA: Diagnosis not present

## 2016-04-06 DIAGNOSIS — D631 Anemia in chronic kidney disease: Secondary | ICD-10-CM | POA: Diagnosis not present

## 2016-04-06 DIAGNOSIS — N2581 Secondary hyperparathyroidism of renal origin: Secondary | ICD-10-CM | POA: Diagnosis not present

## 2016-04-06 DIAGNOSIS — N186 End stage renal disease: Secondary | ICD-10-CM | POA: Diagnosis not present

## 2016-04-08 DIAGNOSIS — N186 End stage renal disease: Secondary | ICD-10-CM | POA: Diagnosis not present

## 2016-04-08 DIAGNOSIS — E039 Hypothyroidism, unspecified: Secondary | ICD-10-CM | POA: Diagnosis not present

## 2016-04-08 DIAGNOSIS — E162 Hypoglycemia, unspecified: Secondary | ICD-10-CM | POA: Diagnosis not present

## 2016-04-08 DIAGNOSIS — D631 Anemia in chronic kidney disease: Secondary | ICD-10-CM | POA: Diagnosis not present

## 2016-04-08 DIAGNOSIS — N2581 Secondary hyperparathyroidism of renal origin: Secondary | ICD-10-CM | POA: Diagnosis not present

## 2016-04-12 DIAGNOSIS — I272 Other secondary pulmonary hypertension: Secondary | ICD-10-CM | POA: Diagnosis present

## 2016-04-12 DIAGNOSIS — I158 Other secondary hypertension: Secondary | ICD-10-CM | POA: Diagnosis not present

## 2016-04-12 DIAGNOSIS — Z888 Allergy status to other drugs, medicaments and biological substances status: Secondary | ICD-10-CM | POA: Diagnosis not present

## 2016-04-12 DIAGNOSIS — I517 Cardiomegaly: Secondary | ICD-10-CM | POA: Diagnosis not present

## 2016-04-12 DIAGNOSIS — E8770 Fluid overload, unspecified: Secondary | ICD-10-CM | POA: Diagnosis present

## 2016-04-12 DIAGNOSIS — Z87442 Personal history of urinary calculi: Secondary | ICD-10-CM | POA: Diagnosis not present

## 2016-04-12 DIAGNOSIS — R0602 Shortness of breath: Secondary | ICD-10-CM | POA: Diagnosis not present

## 2016-04-12 DIAGNOSIS — Z992 Dependence on renal dialysis: Secondary | ICD-10-CM | POA: Diagnosis not present

## 2016-04-12 DIAGNOSIS — Z79899 Other long term (current) drug therapy: Secondary | ICD-10-CM | POA: Diagnosis not present

## 2016-04-12 DIAGNOSIS — N186 End stage renal disease: Secondary | ICD-10-CM | POA: Diagnosis not present

## 2016-04-12 DIAGNOSIS — E872 Acidosis: Secondary | ICD-10-CM | POA: Diagnosis not present

## 2016-04-12 DIAGNOSIS — R7989 Other specified abnormal findings of blood chemistry: Secondary | ICD-10-CM | POA: Diagnosis not present

## 2016-04-12 DIAGNOSIS — R748 Abnormal levels of other serum enzymes: Secondary | ICD-10-CM | POA: Diagnosis present

## 2016-04-12 DIAGNOSIS — E875 Hyperkalemia: Secondary | ICD-10-CM | POA: Diagnosis not present

## 2016-04-12 DIAGNOSIS — N529 Male erectile dysfunction, unspecified: Secondary | ICD-10-CM | POA: Diagnosis present

## 2016-04-12 DIAGNOSIS — I161 Hypertensive emergency: Secondary | ICD-10-CM | POA: Diagnosis not present

## 2016-04-12 DIAGNOSIS — R110 Nausea: Secondary | ICD-10-CM | POA: Diagnosis not present

## 2016-04-12 DIAGNOSIS — E8779 Other fluid overload: Secondary | ICD-10-CM | POA: Diagnosis not present

## 2016-04-12 DIAGNOSIS — N2581 Secondary hyperparathyroidism of renal origin: Secondary | ICD-10-CM | POA: Diagnosis not present

## 2016-04-12 DIAGNOSIS — D631 Anemia in chronic kidney disease: Secondary | ICD-10-CM | POA: Diagnosis present

## 2016-04-12 DIAGNOSIS — I080 Rheumatic disorders of both mitral and aortic valves: Secondary | ICD-10-CM | POA: Diagnosis not present

## 2016-04-12 DIAGNOSIS — I160 Hypertensive urgency: Secondary | ICD-10-CM | POA: Diagnosis not present

## 2016-04-12 DIAGNOSIS — Z9115 Patient's noncompliance with renal dialysis: Secondary | ICD-10-CM | POA: Diagnosis not present

## 2016-04-12 DIAGNOSIS — I4581 Long QT syndrome: Secondary | ICD-10-CM | POA: Diagnosis not present

## 2016-04-12 DIAGNOSIS — D696 Thrombocytopenia, unspecified: Secondary | ICD-10-CM | POA: Diagnosis present

## 2016-04-12 DIAGNOSIS — R0789 Other chest pain: Secondary | ICD-10-CM | POA: Diagnosis not present

## 2016-04-12 DIAGNOSIS — R51 Headache: Secondary | ICD-10-CM | POA: Diagnosis not present

## 2016-04-12 DIAGNOSIS — J90 Pleural effusion, not elsewhere classified: Secondary | ICD-10-CM | POA: Diagnosis not present

## 2016-04-12 DIAGNOSIS — D649 Anemia, unspecified: Secondary | ICD-10-CM | POA: Diagnosis not present

## 2016-04-12 DIAGNOSIS — I120 Hypertensive chronic kidney disease with stage 5 chronic kidney disease or end stage renal disease: Secondary | ICD-10-CM | POA: Diagnosis not present

## 2016-04-13 DIAGNOSIS — N186 End stage renal disease: Secondary | ICD-10-CM | POA: Diagnosis not present

## 2016-04-13 DIAGNOSIS — E8779 Other fluid overload: Secondary | ICD-10-CM | POA: Diagnosis not present

## 2016-04-13 DIAGNOSIS — I120 Hypertensive chronic kidney disease with stage 5 chronic kidney disease or end stage renal disease: Secondary | ICD-10-CM | POA: Diagnosis not present

## 2016-04-13 DIAGNOSIS — Z992 Dependence on renal dialysis: Secondary | ICD-10-CM | POA: Diagnosis not present

## 2016-04-13 DIAGNOSIS — I160 Hypertensive urgency: Secondary | ICD-10-CM | POA: Diagnosis not present

## 2016-04-17 DIAGNOSIS — N2581 Secondary hyperparathyroidism of renal origin: Secondary | ICD-10-CM | POA: Diagnosis not present

## 2016-04-17 DIAGNOSIS — D631 Anemia in chronic kidney disease: Secondary | ICD-10-CM | POA: Diagnosis not present

## 2016-04-17 DIAGNOSIS — N186 End stage renal disease: Secondary | ICD-10-CM | POA: Diagnosis not present

## 2016-04-20 DIAGNOSIS — N186 End stage renal disease: Secondary | ICD-10-CM | POA: Diagnosis not present

## 2016-04-20 DIAGNOSIS — N2581 Secondary hyperparathyroidism of renal origin: Secondary | ICD-10-CM | POA: Diagnosis not present

## 2016-04-20 DIAGNOSIS — D631 Anemia in chronic kidney disease: Secondary | ICD-10-CM | POA: Diagnosis not present

## 2016-04-24 DIAGNOSIS — D631 Anemia in chronic kidney disease: Secondary | ICD-10-CM | POA: Diagnosis not present

## 2016-04-24 DIAGNOSIS — N2581 Secondary hyperparathyroidism of renal origin: Secondary | ICD-10-CM | POA: Diagnosis not present

## 2016-04-24 DIAGNOSIS — N186 End stage renal disease: Secondary | ICD-10-CM | POA: Diagnosis not present

## 2016-04-27 DIAGNOSIS — D631 Anemia in chronic kidney disease: Secondary | ICD-10-CM | POA: Diagnosis not present

## 2016-04-27 DIAGNOSIS — N2581 Secondary hyperparathyroidism of renal origin: Secondary | ICD-10-CM | POA: Diagnosis not present

## 2016-04-27 DIAGNOSIS — N186 End stage renal disease: Secondary | ICD-10-CM | POA: Diagnosis not present

## 2016-04-30 DIAGNOSIS — N2581 Secondary hyperparathyroidism of renal origin: Secondary | ICD-10-CM | POA: Diagnosis not present

## 2016-04-30 DIAGNOSIS — D631 Anemia in chronic kidney disease: Secondary | ICD-10-CM | POA: Diagnosis not present

## 2016-04-30 DIAGNOSIS — N186 End stage renal disease: Secondary | ICD-10-CM | POA: Diagnosis not present

## 2016-05-03 DIAGNOSIS — Z992 Dependence on renal dialysis: Secondary | ICD-10-CM | POA: Diagnosis not present

## 2016-05-03 DIAGNOSIS — N186 End stage renal disease: Secondary | ICD-10-CM | POA: Diagnosis not present

## 2016-05-04 DIAGNOSIS — D631 Anemia in chronic kidney disease: Secondary | ICD-10-CM | POA: Diagnosis not present

## 2016-05-04 DIAGNOSIS — N186 End stage renal disease: Secondary | ICD-10-CM | POA: Diagnosis not present

## 2016-05-04 DIAGNOSIS — N2581 Secondary hyperparathyroidism of renal origin: Secondary | ICD-10-CM | POA: Diagnosis not present

## 2016-05-04 DIAGNOSIS — D509 Iron deficiency anemia, unspecified: Secondary | ICD-10-CM | POA: Diagnosis not present

## 2016-05-06 DIAGNOSIS — N2581 Secondary hyperparathyroidism of renal origin: Secondary | ICD-10-CM | POA: Diagnosis not present

## 2016-05-06 DIAGNOSIS — D509 Iron deficiency anemia, unspecified: Secondary | ICD-10-CM | POA: Diagnosis not present

## 2016-05-06 DIAGNOSIS — N186 End stage renal disease: Secondary | ICD-10-CM | POA: Diagnosis not present

## 2016-05-06 DIAGNOSIS — D631 Anemia in chronic kidney disease: Secondary | ICD-10-CM | POA: Diagnosis not present

## 2016-05-12 DIAGNOSIS — D631 Anemia in chronic kidney disease: Secondary | ICD-10-CM | POA: Diagnosis not present

## 2016-05-12 DIAGNOSIS — N186 End stage renal disease: Secondary | ICD-10-CM | POA: Diagnosis not present

## 2016-05-12 DIAGNOSIS — N2581 Secondary hyperparathyroidism of renal origin: Secondary | ICD-10-CM | POA: Diagnosis not present

## 2016-05-12 DIAGNOSIS — D509 Iron deficiency anemia, unspecified: Secondary | ICD-10-CM | POA: Diagnosis not present

## 2016-05-18 DIAGNOSIS — N186 End stage renal disease: Secondary | ICD-10-CM | POA: Diagnosis not present

## 2016-05-18 DIAGNOSIS — N2581 Secondary hyperparathyroidism of renal origin: Secondary | ICD-10-CM | POA: Diagnosis not present

## 2016-05-18 DIAGNOSIS — D509 Iron deficiency anemia, unspecified: Secondary | ICD-10-CM | POA: Diagnosis not present

## 2016-05-18 DIAGNOSIS — D631 Anemia in chronic kidney disease: Secondary | ICD-10-CM | POA: Diagnosis not present

## 2016-05-20 DIAGNOSIS — N2581 Secondary hyperparathyroidism of renal origin: Secondary | ICD-10-CM | POA: Diagnosis not present

## 2016-05-20 DIAGNOSIS — D631 Anemia in chronic kidney disease: Secondary | ICD-10-CM | POA: Diagnosis not present

## 2016-05-20 DIAGNOSIS — N186 End stage renal disease: Secondary | ICD-10-CM | POA: Diagnosis not present

## 2016-05-20 DIAGNOSIS — D509 Iron deficiency anemia, unspecified: Secondary | ICD-10-CM | POA: Diagnosis not present

## 2016-05-26 DIAGNOSIS — N186 End stage renal disease: Secondary | ICD-10-CM | POA: Diagnosis not present

## 2016-05-26 DIAGNOSIS — D509 Iron deficiency anemia, unspecified: Secondary | ICD-10-CM | POA: Diagnosis not present

## 2016-05-26 DIAGNOSIS — N2581 Secondary hyperparathyroidism of renal origin: Secondary | ICD-10-CM | POA: Diagnosis not present

## 2016-05-26 DIAGNOSIS — D631 Anemia in chronic kidney disease: Secondary | ICD-10-CM | POA: Diagnosis not present

## 2016-05-28 DIAGNOSIS — N2581 Secondary hyperparathyroidism of renal origin: Secondary | ICD-10-CM | POA: Diagnosis not present

## 2016-05-28 DIAGNOSIS — D509 Iron deficiency anemia, unspecified: Secondary | ICD-10-CM | POA: Diagnosis not present

## 2016-05-28 DIAGNOSIS — N186 End stage renal disease: Secondary | ICD-10-CM | POA: Diagnosis not present

## 2016-05-28 DIAGNOSIS — D631 Anemia in chronic kidney disease: Secondary | ICD-10-CM | POA: Diagnosis not present

## 2016-05-31 DIAGNOSIS — N186 End stage renal disease: Secondary | ICD-10-CM | POA: Diagnosis not present

## 2016-05-31 DIAGNOSIS — D631 Anemia in chronic kidney disease: Secondary | ICD-10-CM | POA: Diagnosis not present

## 2016-05-31 DIAGNOSIS — N2581 Secondary hyperparathyroidism of renal origin: Secondary | ICD-10-CM | POA: Diagnosis not present

## 2016-05-31 DIAGNOSIS — D509 Iron deficiency anemia, unspecified: Secondary | ICD-10-CM | POA: Diagnosis not present

## 2016-06-03 DIAGNOSIS — N186 End stage renal disease: Secondary | ICD-10-CM | POA: Diagnosis not present

## 2016-06-03 DIAGNOSIS — Z992 Dependence on renal dialysis: Secondary | ICD-10-CM | POA: Diagnosis not present

## 2016-06-04 DIAGNOSIS — D509 Iron deficiency anemia, unspecified: Secondary | ICD-10-CM | POA: Diagnosis not present

## 2016-06-04 DIAGNOSIS — N186 End stage renal disease: Secondary | ICD-10-CM | POA: Diagnosis not present

## 2016-06-04 DIAGNOSIS — N2581 Secondary hyperparathyroidism of renal origin: Secondary | ICD-10-CM | POA: Diagnosis not present

## 2016-06-04 DIAGNOSIS — D631 Anemia in chronic kidney disease: Secondary | ICD-10-CM | POA: Diagnosis not present

## 2016-06-07 DIAGNOSIS — D631 Anemia in chronic kidney disease: Secondary | ICD-10-CM | POA: Diagnosis not present

## 2016-06-07 DIAGNOSIS — N2581 Secondary hyperparathyroidism of renal origin: Secondary | ICD-10-CM | POA: Diagnosis not present

## 2016-06-07 DIAGNOSIS — N186 End stage renal disease: Secondary | ICD-10-CM | POA: Diagnosis not present

## 2016-06-07 DIAGNOSIS — D509 Iron deficiency anemia, unspecified: Secondary | ICD-10-CM | POA: Diagnosis not present

## 2016-06-09 DIAGNOSIS — D509 Iron deficiency anemia, unspecified: Secondary | ICD-10-CM | POA: Diagnosis not present

## 2016-06-09 DIAGNOSIS — N2581 Secondary hyperparathyroidism of renal origin: Secondary | ICD-10-CM | POA: Diagnosis not present

## 2016-06-09 DIAGNOSIS — N186 End stage renal disease: Secondary | ICD-10-CM | POA: Diagnosis not present

## 2016-06-09 DIAGNOSIS — D631 Anemia in chronic kidney disease: Secondary | ICD-10-CM | POA: Diagnosis not present

## 2016-06-14 DIAGNOSIS — N2581 Secondary hyperparathyroidism of renal origin: Secondary | ICD-10-CM | POA: Diagnosis not present

## 2016-06-14 DIAGNOSIS — D631 Anemia in chronic kidney disease: Secondary | ICD-10-CM | POA: Diagnosis not present

## 2016-06-14 DIAGNOSIS — D509 Iron deficiency anemia, unspecified: Secondary | ICD-10-CM | POA: Diagnosis not present

## 2016-06-14 DIAGNOSIS — N186 End stage renal disease: Secondary | ICD-10-CM | POA: Diagnosis not present

## 2016-06-16 DIAGNOSIS — D631 Anemia in chronic kidney disease: Secondary | ICD-10-CM | POA: Diagnosis not present

## 2016-06-16 DIAGNOSIS — D509 Iron deficiency anemia, unspecified: Secondary | ICD-10-CM | POA: Diagnosis not present

## 2016-06-16 DIAGNOSIS — N186 End stage renal disease: Secondary | ICD-10-CM | POA: Diagnosis not present

## 2016-06-16 DIAGNOSIS — N2581 Secondary hyperparathyroidism of renal origin: Secondary | ICD-10-CM | POA: Diagnosis not present

## 2016-06-18 DIAGNOSIS — N529 Male erectile dysfunction, unspecified: Secondary | ICD-10-CM | POA: Diagnosis not present

## 2016-06-18 DIAGNOSIS — F419 Anxiety disorder, unspecified: Secondary | ICD-10-CM | POA: Diagnosis not present

## 2016-06-18 DIAGNOSIS — D631 Anemia in chronic kidney disease: Secondary | ICD-10-CM | POA: Diagnosis not present

## 2016-06-18 DIAGNOSIS — N2581 Secondary hyperparathyroidism of renal origin: Secondary | ICD-10-CM | POA: Diagnosis not present

## 2016-06-18 DIAGNOSIS — F900 Attention-deficit hyperactivity disorder, predominantly inattentive type: Secondary | ICD-10-CM | POA: Diagnosis not present

## 2016-06-18 DIAGNOSIS — G4700 Insomnia, unspecified: Secondary | ICD-10-CM | POA: Diagnosis not present

## 2016-06-18 DIAGNOSIS — I120 Hypertensive chronic kidney disease with stage 5 chronic kidney disease or end stage renal disease: Secondary | ICD-10-CM | POA: Diagnosis not present

## 2016-06-18 DIAGNOSIS — M545 Low back pain: Secondary | ICD-10-CM | POA: Diagnosis not present

## 2016-06-18 DIAGNOSIS — N185 Chronic kidney disease, stage 5: Secondary | ICD-10-CM | POA: Diagnosis not present

## 2016-06-21 DIAGNOSIS — D631 Anemia in chronic kidney disease: Secondary | ICD-10-CM | POA: Diagnosis not present

## 2016-06-21 DIAGNOSIS — D509 Iron deficiency anemia, unspecified: Secondary | ICD-10-CM | POA: Diagnosis not present

## 2016-06-21 DIAGNOSIS — N186 End stage renal disease: Secondary | ICD-10-CM | POA: Diagnosis not present

## 2016-06-21 DIAGNOSIS — N2581 Secondary hyperparathyroidism of renal origin: Secondary | ICD-10-CM | POA: Diagnosis not present

## 2016-06-23 DIAGNOSIS — D631 Anemia in chronic kidney disease: Secondary | ICD-10-CM | POA: Diagnosis not present

## 2016-06-23 DIAGNOSIS — N2581 Secondary hyperparathyroidism of renal origin: Secondary | ICD-10-CM | POA: Diagnosis not present

## 2016-06-23 DIAGNOSIS — D509 Iron deficiency anemia, unspecified: Secondary | ICD-10-CM | POA: Diagnosis not present

## 2016-06-23 DIAGNOSIS — N186 End stage renal disease: Secondary | ICD-10-CM | POA: Diagnosis not present

## 2016-06-25 DIAGNOSIS — N186 End stage renal disease: Secondary | ICD-10-CM | POA: Diagnosis not present

## 2016-06-25 DIAGNOSIS — N2581 Secondary hyperparathyroidism of renal origin: Secondary | ICD-10-CM | POA: Diagnosis not present

## 2016-06-25 DIAGNOSIS — D509 Iron deficiency anemia, unspecified: Secondary | ICD-10-CM | POA: Diagnosis not present

## 2016-06-25 DIAGNOSIS — D631 Anemia in chronic kidney disease: Secondary | ICD-10-CM | POA: Diagnosis not present

## 2016-06-28 DIAGNOSIS — D509 Iron deficiency anemia, unspecified: Secondary | ICD-10-CM | POA: Diagnosis not present

## 2016-06-28 DIAGNOSIS — N186 End stage renal disease: Secondary | ICD-10-CM | POA: Diagnosis not present

## 2016-06-28 DIAGNOSIS — N2581 Secondary hyperparathyroidism of renal origin: Secondary | ICD-10-CM | POA: Diagnosis not present

## 2016-06-28 DIAGNOSIS — D631 Anemia in chronic kidney disease: Secondary | ICD-10-CM | POA: Diagnosis not present

## 2016-06-30 DIAGNOSIS — N186 End stage renal disease: Secondary | ICD-10-CM | POA: Diagnosis not present

## 2016-06-30 DIAGNOSIS — D509 Iron deficiency anemia, unspecified: Secondary | ICD-10-CM | POA: Diagnosis not present

## 2016-06-30 DIAGNOSIS — N2581 Secondary hyperparathyroidism of renal origin: Secondary | ICD-10-CM | POA: Diagnosis not present

## 2016-06-30 DIAGNOSIS — D631 Anemia in chronic kidney disease: Secondary | ICD-10-CM | POA: Diagnosis not present

## 2016-07-02 DIAGNOSIS — N2581 Secondary hyperparathyroidism of renal origin: Secondary | ICD-10-CM | POA: Diagnosis not present

## 2016-07-02 DIAGNOSIS — D631 Anemia in chronic kidney disease: Secondary | ICD-10-CM | POA: Diagnosis not present

## 2016-07-02 DIAGNOSIS — N186 End stage renal disease: Secondary | ICD-10-CM | POA: Diagnosis not present

## 2016-07-02 DIAGNOSIS — D509 Iron deficiency anemia, unspecified: Secondary | ICD-10-CM | POA: Diagnosis not present

## 2016-07-03 DIAGNOSIS — N186 End stage renal disease: Secondary | ICD-10-CM | POA: Diagnosis not present

## 2016-07-03 DIAGNOSIS — Z992 Dependence on renal dialysis: Secondary | ICD-10-CM | POA: Diagnosis not present

## 2016-07-05 DIAGNOSIS — Z23 Encounter for immunization: Secondary | ICD-10-CM | POA: Diagnosis not present

## 2016-07-05 DIAGNOSIS — D509 Iron deficiency anemia, unspecified: Secondary | ICD-10-CM | POA: Diagnosis not present

## 2016-07-05 DIAGNOSIS — N2581 Secondary hyperparathyroidism of renal origin: Secondary | ICD-10-CM | POA: Diagnosis not present

## 2016-07-05 DIAGNOSIS — D631 Anemia in chronic kidney disease: Secondary | ICD-10-CM | POA: Diagnosis not present

## 2016-07-05 DIAGNOSIS — N186 End stage renal disease: Secondary | ICD-10-CM | POA: Diagnosis not present

## 2016-07-06 DIAGNOSIS — I5032 Chronic diastolic (congestive) heart failure: Secondary | ICD-10-CM | POA: Diagnosis not present

## 2016-07-06 DIAGNOSIS — N2581 Secondary hyperparathyroidism of renal origin: Secondary | ICD-10-CM | POA: Diagnosis not present

## 2016-07-06 DIAGNOSIS — D649 Anemia, unspecified: Secondary | ICD-10-CM | POA: Diagnosis not present

## 2016-07-06 DIAGNOSIS — Z992 Dependence on renal dialysis: Secondary | ICD-10-CM | POA: Diagnosis not present

## 2016-07-06 DIAGNOSIS — R600 Localized edema: Secondary | ICD-10-CM | POA: Diagnosis not present

## 2016-07-06 DIAGNOSIS — E8770 Fluid overload, unspecified: Secondary | ICD-10-CM | POA: Diagnosis not present

## 2016-07-06 DIAGNOSIS — N186 End stage renal disease: Secondary | ICD-10-CM | POA: Diagnosis not present

## 2016-07-06 DIAGNOSIS — E875 Hyperkalemia: Secondary | ICD-10-CM | POA: Diagnosis not present

## 2016-07-06 DIAGNOSIS — I120 Hypertensive chronic kidney disease with stage 5 chronic kidney disease or end stage renal disease: Secondary | ICD-10-CM | POA: Diagnosis not present

## 2016-07-06 DIAGNOSIS — I119 Hypertensive heart disease without heart failure: Secondary | ICD-10-CM | POA: Diagnosis not present

## 2016-07-07 DIAGNOSIS — D509 Iron deficiency anemia, unspecified: Secondary | ICD-10-CM | POA: Diagnosis not present

## 2016-07-07 DIAGNOSIS — Z23 Encounter for immunization: Secondary | ICD-10-CM | POA: Diagnosis not present

## 2016-07-07 DIAGNOSIS — E039 Hypothyroidism, unspecified: Secondary | ICD-10-CM | POA: Diagnosis not present

## 2016-07-07 DIAGNOSIS — N2581 Secondary hyperparathyroidism of renal origin: Secondary | ICD-10-CM | POA: Diagnosis not present

## 2016-07-07 DIAGNOSIS — N186 End stage renal disease: Secondary | ICD-10-CM | POA: Diagnosis not present

## 2016-07-07 DIAGNOSIS — D631 Anemia in chronic kidney disease: Secondary | ICD-10-CM | POA: Diagnosis not present

## 2016-07-09 DIAGNOSIS — N186 End stage renal disease: Secondary | ICD-10-CM | POA: Diagnosis not present

## 2016-07-09 DIAGNOSIS — D631 Anemia in chronic kidney disease: Secondary | ICD-10-CM | POA: Diagnosis not present

## 2016-07-09 DIAGNOSIS — Z23 Encounter for immunization: Secondary | ICD-10-CM | POA: Diagnosis not present

## 2016-07-09 DIAGNOSIS — N2581 Secondary hyperparathyroidism of renal origin: Secondary | ICD-10-CM | POA: Diagnosis not present

## 2016-07-09 DIAGNOSIS — D509 Iron deficiency anemia, unspecified: Secondary | ICD-10-CM | POA: Diagnosis not present

## 2016-07-14 DIAGNOSIS — Z23 Encounter for immunization: Secondary | ICD-10-CM | POA: Diagnosis not present

## 2016-07-14 DIAGNOSIS — N186 End stage renal disease: Secondary | ICD-10-CM | POA: Diagnosis not present

## 2016-07-14 DIAGNOSIS — D631 Anemia in chronic kidney disease: Secondary | ICD-10-CM | POA: Diagnosis not present

## 2016-07-14 DIAGNOSIS — D509 Iron deficiency anemia, unspecified: Secondary | ICD-10-CM | POA: Diagnosis not present

## 2016-07-14 DIAGNOSIS — N2581 Secondary hyperparathyroidism of renal origin: Secondary | ICD-10-CM | POA: Diagnosis not present

## 2016-07-16 DIAGNOSIS — N186 End stage renal disease: Secondary | ICD-10-CM | POA: Diagnosis not present

## 2016-07-16 DIAGNOSIS — N2581 Secondary hyperparathyroidism of renal origin: Secondary | ICD-10-CM | POA: Diagnosis not present

## 2016-07-16 DIAGNOSIS — D631 Anemia in chronic kidney disease: Secondary | ICD-10-CM | POA: Diagnosis not present

## 2016-07-16 DIAGNOSIS — Z23 Encounter for immunization: Secondary | ICD-10-CM | POA: Diagnosis not present

## 2016-07-16 DIAGNOSIS — D509 Iron deficiency anemia, unspecified: Secondary | ICD-10-CM | POA: Diagnosis not present

## 2016-07-19 DIAGNOSIS — D509 Iron deficiency anemia, unspecified: Secondary | ICD-10-CM | POA: Diagnosis not present

## 2016-07-19 DIAGNOSIS — N2581 Secondary hyperparathyroidism of renal origin: Secondary | ICD-10-CM | POA: Diagnosis not present

## 2016-07-19 DIAGNOSIS — N186 End stage renal disease: Secondary | ICD-10-CM | POA: Diagnosis not present

## 2016-07-19 DIAGNOSIS — Z23 Encounter for immunization: Secondary | ICD-10-CM | POA: Diagnosis not present

## 2016-07-19 DIAGNOSIS — D631 Anemia in chronic kidney disease: Secondary | ICD-10-CM | POA: Diagnosis not present

## 2016-07-21 DIAGNOSIS — D631 Anemia in chronic kidney disease: Secondary | ICD-10-CM | POA: Diagnosis not present

## 2016-07-21 DIAGNOSIS — Z23 Encounter for immunization: Secondary | ICD-10-CM | POA: Diagnosis not present

## 2016-07-21 DIAGNOSIS — D509 Iron deficiency anemia, unspecified: Secondary | ICD-10-CM | POA: Diagnosis not present

## 2016-07-21 DIAGNOSIS — N186 End stage renal disease: Secondary | ICD-10-CM | POA: Diagnosis not present

## 2016-07-21 DIAGNOSIS — N2581 Secondary hyperparathyroidism of renal origin: Secondary | ICD-10-CM | POA: Diagnosis not present

## 2016-07-22 DIAGNOSIS — I5032 Chronic diastolic (congestive) heart failure: Secondary | ICD-10-CM | POA: Diagnosis not present

## 2016-07-23 DIAGNOSIS — D509 Iron deficiency anemia, unspecified: Secondary | ICD-10-CM | POA: Diagnosis not present

## 2016-07-23 DIAGNOSIS — N186 End stage renal disease: Secondary | ICD-10-CM | POA: Diagnosis not present

## 2016-07-23 DIAGNOSIS — D631 Anemia in chronic kidney disease: Secondary | ICD-10-CM | POA: Diagnosis not present

## 2016-07-23 DIAGNOSIS — N2581 Secondary hyperparathyroidism of renal origin: Secondary | ICD-10-CM | POA: Diagnosis not present

## 2016-07-23 DIAGNOSIS — Z23 Encounter for immunization: Secondary | ICD-10-CM | POA: Diagnosis not present

## 2016-07-26 DIAGNOSIS — Z23 Encounter for immunization: Secondary | ICD-10-CM | POA: Diagnosis not present

## 2016-07-26 DIAGNOSIS — N2581 Secondary hyperparathyroidism of renal origin: Secondary | ICD-10-CM | POA: Diagnosis not present

## 2016-07-26 DIAGNOSIS — D509 Iron deficiency anemia, unspecified: Secondary | ICD-10-CM | POA: Diagnosis not present

## 2016-07-26 DIAGNOSIS — N186 End stage renal disease: Secondary | ICD-10-CM | POA: Diagnosis not present

## 2016-07-26 DIAGNOSIS — D631 Anemia in chronic kidney disease: Secondary | ICD-10-CM | POA: Diagnosis not present

## 2016-07-28 DIAGNOSIS — Z23 Encounter for immunization: Secondary | ICD-10-CM | POA: Diagnosis not present

## 2016-07-28 DIAGNOSIS — N2581 Secondary hyperparathyroidism of renal origin: Secondary | ICD-10-CM | POA: Diagnosis not present

## 2016-07-28 DIAGNOSIS — D509 Iron deficiency anemia, unspecified: Secondary | ICD-10-CM | POA: Diagnosis not present

## 2016-07-28 DIAGNOSIS — D631 Anemia in chronic kidney disease: Secondary | ICD-10-CM | POA: Diagnosis not present

## 2016-07-28 DIAGNOSIS — N186 End stage renal disease: Secondary | ICD-10-CM | POA: Diagnosis not present

## 2016-07-30 DIAGNOSIS — N186 End stage renal disease: Secondary | ICD-10-CM | POA: Diagnosis not present

## 2016-07-30 DIAGNOSIS — D509 Iron deficiency anemia, unspecified: Secondary | ICD-10-CM | POA: Diagnosis not present

## 2016-07-30 DIAGNOSIS — D631 Anemia in chronic kidney disease: Secondary | ICD-10-CM | POA: Diagnosis not present

## 2016-07-30 DIAGNOSIS — Z23 Encounter for immunization: Secondary | ICD-10-CM | POA: Diagnosis not present

## 2016-07-30 DIAGNOSIS — N2581 Secondary hyperparathyroidism of renal origin: Secondary | ICD-10-CM | POA: Diagnosis not present

## 2016-08-03 DIAGNOSIS — Z992 Dependence on renal dialysis: Secondary | ICD-10-CM | POA: Diagnosis not present

## 2016-08-03 DIAGNOSIS — N186 End stage renal disease: Secondary | ICD-10-CM | POA: Diagnosis not present

## 2016-08-04 DIAGNOSIS — N2581 Secondary hyperparathyroidism of renal origin: Secondary | ICD-10-CM | POA: Diagnosis not present

## 2016-08-04 DIAGNOSIS — D631 Anemia in chronic kidney disease: Secondary | ICD-10-CM | POA: Diagnosis not present

## 2016-08-04 DIAGNOSIS — N186 End stage renal disease: Secondary | ICD-10-CM | POA: Diagnosis not present

## 2016-08-04 DIAGNOSIS — D509 Iron deficiency anemia, unspecified: Secondary | ICD-10-CM | POA: Diagnosis not present

## 2016-08-04 DIAGNOSIS — E8779 Other fluid overload: Secondary | ICD-10-CM | POA: Diagnosis not present

## 2016-08-06 DIAGNOSIS — E8779 Other fluid overload: Secondary | ICD-10-CM | POA: Diagnosis not present

## 2016-08-06 DIAGNOSIS — D631 Anemia in chronic kidney disease: Secondary | ICD-10-CM | POA: Diagnosis not present

## 2016-08-06 DIAGNOSIS — N2581 Secondary hyperparathyroidism of renal origin: Secondary | ICD-10-CM | POA: Diagnosis not present

## 2016-08-06 DIAGNOSIS — D509 Iron deficiency anemia, unspecified: Secondary | ICD-10-CM | POA: Diagnosis not present

## 2016-08-06 DIAGNOSIS — N186 End stage renal disease: Secondary | ICD-10-CM | POA: Diagnosis not present

## 2016-08-09 DIAGNOSIS — N186 End stage renal disease: Secondary | ICD-10-CM | POA: Diagnosis not present

## 2016-08-09 DIAGNOSIS — N2581 Secondary hyperparathyroidism of renal origin: Secondary | ICD-10-CM | POA: Diagnosis not present

## 2016-08-09 DIAGNOSIS — D631 Anemia in chronic kidney disease: Secondary | ICD-10-CM | POA: Diagnosis not present

## 2016-08-09 DIAGNOSIS — D509 Iron deficiency anemia, unspecified: Secondary | ICD-10-CM | POA: Diagnosis not present

## 2016-08-09 DIAGNOSIS — E8779 Other fluid overload: Secondary | ICD-10-CM | POA: Diagnosis not present

## 2016-08-11 DIAGNOSIS — I120 Hypertensive chronic kidney disease with stage 5 chronic kidney disease or end stage renal disease: Secondary | ICD-10-CM | POA: Diagnosis not present

## 2016-08-11 DIAGNOSIS — Z992 Dependence on renal dialysis: Secondary | ICD-10-CM | POA: Diagnosis not present

## 2016-08-11 DIAGNOSIS — N186 End stage renal disease: Secondary | ICD-10-CM | POA: Diagnosis not present

## 2016-08-11 DIAGNOSIS — N2581 Secondary hyperparathyroidism of renal origin: Secondary | ICD-10-CM | POA: Diagnosis not present

## 2016-08-11 DIAGNOSIS — D631 Anemia in chronic kidney disease: Secondary | ICD-10-CM | POA: Diagnosis not present

## 2016-08-11 DIAGNOSIS — E8779 Other fluid overload: Secondary | ICD-10-CM | POA: Diagnosis not present

## 2016-08-11 DIAGNOSIS — I10 Essential (primary) hypertension: Secondary | ICD-10-CM | POA: Diagnosis not present

## 2016-08-11 DIAGNOSIS — Z888 Allergy status to other drugs, medicaments and biological substances status: Secondary | ICD-10-CM | POA: Diagnosis not present

## 2016-08-11 DIAGNOSIS — D509 Iron deficiency anemia, unspecified: Secondary | ICD-10-CM | POA: Diagnosis not present

## 2016-08-11 DIAGNOSIS — Z79899 Other long term (current) drug therapy: Secondary | ICD-10-CM | POA: Diagnosis not present

## 2016-08-12 DIAGNOSIS — D631 Anemia in chronic kidney disease: Secondary | ICD-10-CM | POA: Diagnosis not present

## 2016-08-12 DIAGNOSIS — D509 Iron deficiency anemia, unspecified: Secondary | ICD-10-CM | POA: Diagnosis not present

## 2016-08-12 DIAGNOSIS — E8779 Other fluid overload: Secondary | ICD-10-CM | POA: Diagnosis not present

## 2016-08-12 DIAGNOSIS — N186 End stage renal disease: Secondary | ICD-10-CM | POA: Diagnosis not present

## 2016-08-12 DIAGNOSIS — N2581 Secondary hyperparathyroidism of renal origin: Secondary | ICD-10-CM | POA: Diagnosis not present

## 2016-08-12 DIAGNOSIS — D696 Thrombocytopenia, unspecified: Secondary | ICD-10-CM | POA: Diagnosis not present

## 2016-08-13 DIAGNOSIS — E8779 Other fluid overload: Secondary | ICD-10-CM | POA: Diagnosis not present

## 2016-08-13 DIAGNOSIS — N186 End stage renal disease: Secondary | ICD-10-CM | POA: Diagnosis not present

## 2016-08-13 DIAGNOSIS — D509 Iron deficiency anemia, unspecified: Secondary | ICD-10-CM | POA: Diagnosis not present

## 2016-08-13 DIAGNOSIS — N2581 Secondary hyperparathyroidism of renal origin: Secondary | ICD-10-CM | POA: Diagnosis not present

## 2016-08-13 DIAGNOSIS — D631 Anemia in chronic kidney disease: Secondary | ICD-10-CM | POA: Diagnosis not present

## 2016-08-16 DIAGNOSIS — E8779 Other fluid overload: Secondary | ICD-10-CM | POA: Diagnosis not present

## 2016-08-16 DIAGNOSIS — D509 Iron deficiency anemia, unspecified: Secondary | ICD-10-CM | POA: Diagnosis not present

## 2016-08-16 DIAGNOSIS — D631 Anemia in chronic kidney disease: Secondary | ICD-10-CM | POA: Diagnosis not present

## 2016-08-16 DIAGNOSIS — N2581 Secondary hyperparathyroidism of renal origin: Secondary | ICD-10-CM | POA: Diagnosis not present

## 2016-08-16 DIAGNOSIS — N186 End stage renal disease: Secondary | ICD-10-CM | POA: Diagnosis not present

## 2016-08-18 DIAGNOSIS — E8779 Other fluid overload: Secondary | ICD-10-CM | POA: Diagnosis not present

## 2016-08-18 DIAGNOSIS — N2581 Secondary hyperparathyroidism of renal origin: Secondary | ICD-10-CM | POA: Diagnosis not present

## 2016-08-18 DIAGNOSIS — D631 Anemia in chronic kidney disease: Secondary | ICD-10-CM | POA: Diagnosis not present

## 2016-08-18 DIAGNOSIS — D509 Iron deficiency anemia, unspecified: Secondary | ICD-10-CM | POA: Diagnosis not present

## 2016-08-18 DIAGNOSIS — N186 End stage renal disease: Secondary | ICD-10-CM | POA: Diagnosis not present

## 2016-08-19 DIAGNOSIS — I120 Hypertensive chronic kidney disease with stage 5 chronic kidney disease or end stage renal disease: Secondary | ICD-10-CM | POA: Diagnosis not present

## 2016-08-19 DIAGNOSIS — Z992 Dependence on renal dialysis: Secondary | ICD-10-CM | POA: Diagnosis not present

## 2016-08-19 DIAGNOSIS — N186 End stage renal disease: Secondary | ICD-10-CM | POA: Diagnosis not present

## 2016-08-20 DIAGNOSIS — E8779 Other fluid overload: Secondary | ICD-10-CM | POA: Diagnosis not present

## 2016-08-20 DIAGNOSIS — D509 Iron deficiency anemia, unspecified: Secondary | ICD-10-CM | POA: Diagnosis not present

## 2016-08-20 DIAGNOSIS — D631 Anemia in chronic kidney disease: Secondary | ICD-10-CM | POA: Diagnosis not present

## 2016-08-20 DIAGNOSIS — N186 End stage renal disease: Secondary | ICD-10-CM | POA: Diagnosis not present

## 2016-08-20 DIAGNOSIS — N2581 Secondary hyperparathyroidism of renal origin: Secondary | ICD-10-CM | POA: Diagnosis not present

## 2016-08-22 DIAGNOSIS — N186 End stage renal disease: Secondary | ICD-10-CM | POA: Diagnosis not present

## 2016-08-22 DIAGNOSIS — D631 Anemia in chronic kidney disease: Secondary | ICD-10-CM | POA: Diagnosis not present

## 2016-08-22 DIAGNOSIS — N2581 Secondary hyperparathyroidism of renal origin: Secondary | ICD-10-CM | POA: Diagnosis not present

## 2016-08-22 DIAGNOSIS — D509 Iron deficiency anemia, unspecified: Secondary | ICD-10-CM | POA: Diagnosis not present

## 2016-08-22 DIAGNOSIS — E8779 Other fluid overload: Secondary | ICD-10-CM | POA: Diagnosis not present

## 2016-08-24 DIAGNOSIS — N186 End stage renal disease: Secondary | ICD-10-CM | POA: Diagnosis not present

## 2016-08-24 DIAGNOSIS — D631 Anemia in chronic kidney disease: Secondary | ICD-10-CM | POA: Diagnosis not present

## 2016-08-24 DIAGNOSIS — D509 Iron deficiency anemia, unspecified: Secondary | ICD-10-CM | POA: Diagnosis not present

## 2016-08-24 DIAGNOSIS — N2581 Secondary hyperparathyroidism of renal origin: Secondary | ICD-10-CM | POA: Diagnosis not present

## 2016-08-24 DIAGNOSIS — E8779 Other fluid overload: Secondary | ICD-10-CM | POA: Diagnosis not present

## 2016-08-27 DIAGNOSIS — N186 End stage renal disease: Secondary | ICD-10-CM | POA: Diagnosis not present

## 2016-08-27 DIAGNOSIS — D631 Anemia in chronic kidney disease: Secondary | ICD-10-CM | POA: Diagnosis not present

## 2016-08-27 DIAGNOSIS — E8779 Other fluid overload: Secondary | ICD-10-CM | POA: Diagnosis not present

## 2016-08-27 DIAGNOSIS — N2581 Secondary hyperparathyroidism of renal origin: Secondary | ICD-10-CM | POA: Diagnosis not present

## 2016-08-27 DIAGNOSIS — D509 Iron deficiency anemia, unspecified: Secondary | ICD-10-CM | POA: Diagnosis not present

## 2016-08-30 DIAGNOSIS — N2581 Secondary hyperparathyroidism of renal origin: Secondary | ICD-10-CM | POA: Diagnosis not present

## 2016-08-30 DIAGNOSIS — E8779 Other fluid overload: Secondary | ICD-10-CM | POA: Diagnosis not present

## 2016-08-30 DIAGNOSIS — D509 Iron deficiency anemia, unspecified: Secondary | ICD-10-CM | POA: Diagnosis not present

## 2016-08-30 DIAGNOSIS — D631 Anemia in chronic kidney disease: Secondary | ICD-10-CM | POA: Diagnosis not present

## 2016-08-30 DIAGNOSIS — N186 End stage renal disease: Secondary | ICD-10-CM | POA: Diagnosis not present

## 2016-09-01 DIAGNOSIS — D631 Anemia in chronic kidney disease: Secondary | ICD-10-CM | POA: Diagnosis not present

## 2016-09-01 DIAGNOSIS — E8779 Other fluid overload: Secondary | ICD-10-CM | POA: Diagnosis not present

## 2016-09-01 DIAGNOSIS — N186 End stage renal disease: Secondary | ICD-10-CM | POA: Diagnosis not present

## 2016-09-01 DIAGNOSIS — D509 Iron deficiency anemia, unspecified: Secondary | ICD-10-CM | POA: Diagnosis not present

## 2016-09-01 DIAGNOSIS — N2581 Secondary hyperparathyroidism of renal origin: Secondary | ICD-10-CM | POA: Diagnosis not present

## 2016-09-02 DIAGNOSIS — Z992 Dependence on renal dialysis: Secondary | ICD-10-CM | POA: Diagnosis not present

## 2016-09-02 DIAGNOSIS — N186 End stage renal disease: Secondary | ICD-10-CM | POA: Diagnosis not present

## 2016-09-03 DIAGNOSIS — N2581 Secondary hyperparathyroidism of renal origin: Secondary | ICD-10-CM | POA: Diagnosis not present

## 2016-09-03 DIAGNOSIS — D509 Iron deficiency anemia, unspecified: Secondary | ICD-10-CM | POA: Diagnosis not present

## 2016-09-03 DIAGNOSIS — N186 End stage renal disease: Secondary | ICD-10-CM | POA: Diagnosis not present

## 2016-09-03 DIAGNOSIS — D631 Anemia in chronic kidney disease: Secondary | ICD-10-CM | POA: Diagnosis not present

## 2016-09-06 DIAGNOSIS — D631 Anemia in chronic kidney disease: Secondary | ICD-10-CM | POA: Diagnosis not present

## 2016-09-06 DIAGNOSIS — N186 End stage renal disease: Secondary | ICD-10-CM | POA: Diagnosis not present

## 2016-09-06 DIAGNOSIS — D509 Iron deficiency anemia, unspecified: Secondary | ICD-10-CM | POA: Diagnosis not present

## 2016-09-06 DIAGNOSIS — N2581 Secondary hyperparathyroidism of renal origin: Secondary | ICD-10-CM | POA: Diagnosis not present

## 2016-09-08 DIAGNOSIS — N186 End stage renal disease: Secondary | ICD-10-CM | POA: Diagnosis not present

## 2016-09-08 DIAGNOSIS — N2581 Secondary hyperparathyroidism of renal origin: Secondary | ICD-10-CM | POA: Diagnosis not present

## 2016-09-08 DIAGNOSIS — D631 Anemia in chronic kidney disease: Secondary | ICD-10-CM | POA: Diagnosis not present

## 2016-09-08 DIAGNOSIS — D509 Iron deficiency anemia, unspecified: Secondary | ICD-10-CM | POA: Diagnosis not present

## 2016-09-10 DIAGNOSIS — N186 End stage renal disease: Secondary | ICD-10-CM | POA: Diagnosis not present

## 2016-09-10 DIAGNOSIS — D631 Anemia in chronic kidney disease: Secondary | ICD-10-CM | POA: Diagnosis not present

## 2016-09-10 DIAGNOSIS — D509 Iron deficiency anemia, unspecified: Secondary | ICD-10-CM | POA: Diagnosis not present

## 2016-09-10 DIAGNOSIS — N2581 Secondary hyperparathyroidism of renal origin: Secondary | ICD-10-CM | POA: Diagnosis not present

## 2016-09-13 DIAGNOSIS — D509 Iron deficiency anemia, unspecified: Secondary | ICD-10-CM | POA: Diagnosis not present

## 2016-09-13 DIAGNOSIS — N2581 Secondary hyperparathyroidism of renal origin: Secondary | ICD-10-CM | POA: Diagnosis not present

## 2016-09-13 DIAGNOSIS — D631 Anemia in chronic kidney disease: Secondary | ICD-10-CM | POA: Diagnosis not present

## 2016-09-13 DIAGNOSIS — N186 End stage renal disease: Secondary | ICD-10-CM | POA: Diagnosis not present

## 2016-09-15 DIAGNOSIS — D509 Iron deficiency anemia, unspecified: Secondary | ICD-10-CM | POA: Diagnosis not present

## 2016-09-15 DIAGNOSIS — D631 Anemia in chronic kidney disease: Secondary | ICD-10-CM | POA: Diagnosis not present

## 2016-09-15 DIAGNOSIS — N186 End stage renal disease: Secondary | ICD-10-CM | POA: Diagnosis not present

## 2016-09-15 DIAGNOSIS — N2581 Secondary hyperparathyroidism of renal origin: Secondary | ICD-10-CM | POA: Diagnosis not present

## 2016-09-17 DIAGNOSIS — G4700 Insomnia, unspecified: Secondary | ICD-10-CM | POA: Diagnosis not present

## 2016-09-17 DIAGNOSIS — N186 End stage renal disease: Secondary | ICD-10-CM | POA: Diagnosis not present

## 2016-09-17 DIAGNOSIS — N2581 Secondary hyperparathyroidism of renal origin: Secondary | ICD-10-CM | POA: Diagnosis not present

## 2016-09-17 DIAGNOSIS — D509 Iron deficiency anemia, unspecified: Secondary | ICD-10-CM | POA: Diagnosis not present

## 2016-09-17 DIAGNOSIS — I120 Hypertensive chronic kidney disease with stage 5 chronic kidney disease or end stage renal disease: Secondary | ICD-10-CM | POA: Diagnosis not present

## 2016-09-17 DIAGNOSIS — N185 Chronic kidney disease, stage 5: Secondary | ICD-10-CM | POA: Diagnosis not present

## 2016-09-17 DIAGNOSIS — F419 Anxiety disorder, unspecified: Secondary | ICD-10-CM | POA: Diagnosis not present

## 2016-09-17 DIAGNOSIS — N529 Male erectile dysfunction, unspecified: Secondary | ICD-10-CM | POA: Diagnosis not present

## 2016-09-17 DIAGNOSIS — D631 Anemia in chronic kidney disease: Secondary | ICD-10-CM | POA: Diagnosis not present

## 2016-09-17 DIAGNOSIS — F900 Attention-deficit hyperactivity disorder, predominantly inattentive type: Secondary | ICD-10-CM | POA: Diagnosis not present

## 2016-09-17 DIAGNOSIS — M545 Low back pain: Secondary | ICD-10-CM | POA: Diagnosis not present

## 2016-09-20 DIAGNOSIS — N2581 Secondary hyperparathyroidism of renal origin: Secondary | ICD-10-CM | POA: Diagnosis not present

## 2016-09-20 DIAGNOSIS — D631 Anemia in chronic kidney disease: Secondary | ICD-10-CM | POA: Diagnosis not present

## 2016-09-20 DIAGNOSIS — D509 Iron deficiency anemia, unspecified: Secondary | ICD-10-CM | POA: Diagnosis not present

## 2016-09-20 DIAGNOSIS — N186 End stage renal disease: Secondary | ICD-10-CM | POA: Diagnosis not present

## 2016-09-22 DIAGNOSIS — N186 End stage renal disease: Secondary | ICD-10-CM | POA: Diagnosis not present

## 2016-09-22 DIAGNOSIS — D631 Anemia in chronic kidney disease: Secondary | ICD-10-CM | POA: Diagnosis not present

## 2016-09-22 DIAGNOSIS — D509 Iron deficiency anemia, unspecified: Secondary | ICD-10-CM | POA: Diagnosis not present

## 2016-09-22 DIAGNOSIS — N2581 Secondary hyperparathyroidism of renal origin: Secondary | ICD-10-CM | POA: Diagnosis not present

## 2016-09-24 DIAGNOSIS — N186 End stage renal disease: Secondary | ICD-10-CM | POA: Diagnosis not present

## 2016-09-24 DIAGNOSIS — N2581 Secondary hyperparathyroidism of renal origin: Secondary | ICD-10-CM | POA: Diagnosis not present

## 2016-09-24 DIAGNOSIS — D631 Anemia in chronic kidney disease: Secondary | ICD-10-CM | POA: Diagnosis not present

## 2016-09-24 DIAGNOSIS — D509 Iron deficiency anemia, unspecified: Secondary | ICD-10-CM | POA: Diagnosis not present

## 2016-09-29 DIAGNOSIS — D631 Anemia in chronic kidney disease: Secondary | ICD-10-CM | POA: Diagnosis not present

## 2016-09-29 DIAGNOSIS — D509 Iron deficiency anemia, unspecified: Secondary | ICD-10-CM | POA: Diagnosis not present

## 2016-09-29 DIAGNOSIS — N2581 Secondary hyperparathyroidism of renal origin: Secondary | ICD-10-CM | POA: Diagnosis not present

## 2016-09-29 DIAGNOSIS — N186 End stage renal disease: Secondary | ICD-10-CM | POA: Diagnosis not present

## 2016-10-01 DIAGNOSIS — N186 End stage renal disease: Secondary | ICD-10-CM | POA: Diagnosis not present

## 2016-10-01 DIAGNOSIS — D509 Iron deficiency anemia, unspecified: Secondary | ICD-10-CM | POA: Diagnosis not present

## 2016-10-01 DIAGNOSIS — N2581 Secondary hyperparathyroidism of renal origin: Secondary | ICD-10-CM | POA: Diagnosis not present

## 2016-10-01 DIAGNOSIS — D631 Anemia in chronic kidney disease: Secondary | ICD-10-CM | POA: Diagnosis not present

## 2016-10-03 DIAGNOSIS — Z992 Dependence on renal dialysis: Secondary | ICD-10-CM | POA: Diagnosis not present

## 2016-10-03 DIAGNOSIS — N186 End stage renal disease: Secondary | ICD-10-CM | POA: Diagnosis not present

## 2016-10-06 DIAGNOSIS — I10 Essential (primary) hypertension: Secondary | ICD-10-CM | POA: Diagnosis not present

## 2016-10-06 DIAGNOSIS — E162 Hypoglycemia, unspecified: Secondary | ICD-10-CM | POA: Diagnosis not present

## 2016-10-06 DIAGNOSIS — D631 Anemia in chronic kidney disease: Secondary | ICD-10-CM | POA: Diagnosis not present

## 2016-10-06 DIAGNOSIS — D509 Iron deficiency anemia, unspecified: Secondary | ICD-10-CM | POA: Diagnosis not present

## 2016-10-06 DIAGNOSIS — N2581 Secondary hyperparathyroidism of renal origin: Secondary | ICD-10-CM | POA: Diagnosis not present

## 2016-10-06 DIAGNOSIS — N186 End stage renal disease: Secondary | ICD-10-CM | POA: Diagnosis not present

## 2016-10-06 DIAGNOSIS — E039 Hypothyroidism, unspecified: Secondary | ICD-10-CM | POA: Diagnosis not present

## 2016-10-08 DIAGNOSIS — N2581 Secondary hyperparathyroidism of renal origin: Secondary | ICD-10-CM | POA: Diagnosis not present

## 2016-10-08 DIAGNOSIS — N186 End stage renal disease: Secondary | ICD-10-CM | POA: Diagnosis not present

## 2016-10-08 DIAGNOSIS — D509 Iron deficiency anemia, unspecified: Secondary | ICD-10-CM | POA: Diagnosis not present

## 2016-10-08 DIAGNOSIS — D631 Anemia in chronic kidney disease: Secondary | ICD-10-CM | POA: Diagnosis not present

## 2016-10-12 DIAGNOSIS — D509 Iron deficiency anemia, unspecified: Secondary | ICD-10-CM | POA: Diagnosis not present

## 2016-10-12 DIAGNOSIS — N186 End stage renal disease: Secondary | ICD-10-CM | POA: Diagnosis not present

## 2016-10-12 DIAGNOSIS — D631 Anemia in chronic kidney disease: Secondary | ICD-10-CM | POA: Diagnosis not present

## 2016-10-12 DIAGNOSIS — N2581 Secondary hyperparathyroidism of renal origin: Secondary | ICD-10-CM | POA: Diagnosis not present

## 2016-10-14 DIAGNOSIS — I871 Compression of vein: Secondary | ICD-10-CM | POA: Diagnosis not present

## 2016-10-14 DIAGNOSIS — T82590A Other mechanical complication of surgically created arteriovenous fistula, initial encounter: Secondary | ICD-10-CM | POA: Diagnosis not present

## 2016-10-15 DIAGNOSIS — D509 Iron deficiency anemia, unspecified: Secondary | ICD-10-CM | POA: Diagnosis not present

## 2016-10-15 DIAGNOSIS — N2581 Secondary hyperparathyroidism of renal origin: Secondary | ICD-10-CM | POA: Diagnosis not present

## 2016-10-15 DIAGNOSIS — N186 End stage renal disease: Secondary | ICD-10-CM | POA: Diagnosis not present

## 2016-10-15 DIAGNOSIS — D631 Anemia in chronic kidney disease: Secondary | ICD-10-CM | POA: Diagnosis not present

## 2016-10-18 DIAGNOSIS — D509 Iron deficiency anemia, unspecified: Secondary | ICD-10-CM | POA: Diagnosis not present

## 2016-10-18 DIAGNOSIS — N186 End stage renal disease: Secondary | ICD-10-CM | POA: Diagnosis not present

## 2016-10-18 DIAGNOSIS — N2581 Secondary hyperparathyroidism of renal origin: Secondary | ICD-10-CM | POA: Diagnosis not present

## 2016-10-18 DIAGNOSIS — D631 Anemia in chronic kidney disease: Secondary | ICD-10-CM | POA: Diagnosis not present

## 2016-10-22 DIAGNOSIS — D509 Iron deficiency anemia, unspecified: Secondary | ICD-10-CM | POA: Diagnosis not present

## 2016-10-22 DIAGNOSIS — N2581 Secondary hyperparathyroidism of renal origin: Secondary | ICD-10-CM | POA: Diagnosis not present

## 2016-10-22 DIAGNOSIS — N186 End stage renal disease: Secondary | ICD-10-CM | POA: Diagnosis not present

## 2016-10-22 DIAGNOSIS — D631 Anemia in chronic kidney disease: Secondary | ICD-10-CM | POA: Diagnosis not present

## 2016-10-23 DIAGNOSIS — D631 Anemia in chronic kidney disease: Secondary | ICD-10-CM | POA: Diagnosis not present

## 2016-10-23 DIAGNOSIS — N186 End stage renal disease: Secondary | ICD-10-CM | POA: Diagnosis not present

## 2016-10-23 DIAGNOSIS — D509 Iron deficiency anemia, unspecified: Secondary | ICD-10-CM | POA: Diagnosis not present

## 2016-10-23 DIAGNOSIS — N2581 Secondary hyperparathyroidism of renal origin: Secondary | ICD-10-CM | POA: Diagnosis not present

## 2016-10-25 DIAGNOSIS — D509 Iron deficiency anemia, unspecified: Secondary | ICD-10-CM | POA: Diagnosis not present

## 2016-10-25 DIAGNOSIS — N2581 Secondary hyperparathyroidism of renal origin: Secondary | ICD-10-CM | POA: Diagnosis not present

## 2016-10-25 DIAGNOSIS — N186 End stage renal disease: Secondary | ICD-10-CM | POA: Diagnosis not present

## 2016-10-25 DIAGNOSIS — D631 Anemia in chronic kidney disease: Secondary | ICD-10-CM | POA: Diagnosis not present

## 2016-10-27 DIAGNOSIS — N186 End stage renal disease: Secondary | ICD-10-CM | POA: Diagnosis not present

## 2016-10-27 DIAGNOSIS — N2581 Secondary hyperparathyroidism of renal origin: Secondary | ICD-10-CM | POA: Diagnosis not present

## 2016-10-27 DIAGNOSIS — D509 Iron deficiency anemia, unspecified: Secondary | ICD-10-CM | POA: Diagnosis not present

## 2016-10-27 DIAGNOSIS — D631 Anemia in chronic kidney disease: Secondary | ICD-10-CM | POA: Diagnosis not present

## 2016-10-28 DIAGNOSIS — T82898A Other specified complication of vascular prosthetic devices, implants and grafts, initial encounter: Secondary | ICD-10-CM | POA: Diagnosis not present

## 2016-10-28 DIAGNOSIS — Z992 Dependence on renal dialysis: Secondary | ICD-10-CM | POA: Diagnosis not present

## 2016-10-28 DIAGNOSIS — T82858A Stenosis of vascular prosthetic devices, implants and grafts, initial encounter: Secondary | ICD-10-CM | POA: Diagnosis not present

## 2016-10-28 DIAGNOSIS — N186 End stage renal disease: Secondary | ICD-10-CM | POA: Diagnosis not present

## 2016-10-29 DIAGNOSIS — D509 Iron deficiency anemia, unspecified: Secondary | ICD-10-CM | POA: Diagnosis not present

## 2016-10-29 DIAGNOSIS — N2581 Secondary hyperparathyroidism of renal origin: Secondary | ICD-10-CM | POA: Diagnosis not present

## 2016-10-29 DIAGNOSIS — N186 End stage renal disease: Secondary | ICD-10-CM | POA: Diagnosis not present

## 2016-10-29 DIAGNOSIS — D631 Anemia in chronic kidney disease: Secondary | ICD-10-CM | POA: Diagnosis not present

## 2016-11-01 DIAGNOSIS — D631 Anemia in chronic kidney disease: Secondary | ICD-10-CM | POA: Diagnosis not present

## 2016-11-01 DIAGNOSIS — D509 Iron deficiency anemia, unspecified: Secondary | ICD-10-CM | POA: Diagnosis not present

## 2016-11-01 DIAGNOSIS — N186 End stage renal disease: Secondary | ICD-10-CM | POA: Diagnosis not present

## 2016-11-01 DIAGNOSIS — N2581 Secondary hyperparathyroidism of renal origin: Secondary | ICD-10-CM | POA: Diagnosis not present

## 2016-11-03 DIAGNOSIS — N186 End stage renal disease: Secondary | ICD-10-CM | POA: Diagnosis not present

## 2016-11-03 DIAGNOSIS — N2581 Secondary hyperparathyroidism of renal origin: Secondary | ICD-10-CM | POA: Diagnosis not present

## 2016-11-03 DIAGNOSIS — D631 Anemia in chronic kidney disease: Secondary | ICD-10-CM | POA: Diagnosis not present

## 2016-11-03 DIAGNOSIS — D509 Iron deficiency anemia, unspecified: Secondary | ICD-10-CM | POA: Diagnosis not present

## 2016-11-03 DIAGNOSIS — Z992 Dependence on renal dialysis: Secondary | ICD-10-CM | POA: Diagnosis not present

## 2016-11-05 DIAGNOSIS — D509 Iron deficiency anemia, unspecified: Secondary | ICD-10-CM | POA: Diagnosis not present

## 2016-11-05 DIAGNOSIS — N2581 Secondary hyperparathyroidism of renal origin: Secondary | ICD-10-CM | POA: Diagnosis not present

## 2016-11-05 DIAGNOSIS — N186 End stage renal disease: Secondary | ICD-10-CM | POA: Diagnosis not present

## 2016-11-05 DIAGNOSIS — D631 Anemia in chronic kidney disease: Secondary | ICD-10-CM | POA: Diagnosis not present

## 2016-11-08 DIAGNOSIS — N2581 Secondary hyperparathyroidism of renal origin: Secondary | ICD-10-CM | POA: Diagnosis not present

## 2016-11-08 DIAGNOSIS — D631 Anemia in chronic kidney disease: Secondary | ICD-10-CM | POA: Diagnosis not present

## 2016-11-08 DIAGNOSIS — D509 Iron deficiency anemia, unspecified: Secondary | ICD-10-CM | POA: Diagnosis not present

## 2016-11-08 DIAGNOSIS — N186 End stage renal disease: Secondary | ICD-10-CM | POA: Diagnosis not present

## 2016-11-09 DIAGNOSIS — N186 End stage renal disease: Secondary | ICD-10-CM | POA: Diagnosis not present

## 2016-11-09 DIAGNOSIS — D509 Iron deficiency anemia, unspecified: Secondary | ICD-10-CM | POA: Diagnosis not present

## 2016-11-09 DIAGNOSIS — D631 Anemia in chronic kidney disease: Secondary | ICD-10-CM | POA: Diagnosis not present

## 2016-11-09 DIAGNOSIS — N2581 Secondary hyperparathyroidism of renal origin: Secondary | ICD-10-CM | POA: Diagnosis not present

## 2016-11-11 DIAGNOSIS — Z992 Dependence on renal dialysis: Secondary | ICD-10-CM | POA: Diagnosis not present

## 2016-11-11 DIAGNOSIS — N186 End stage renal disease: Secondary | ICD-10-CM | POA: Diagnosis not present

## 2016-11-11 DIAGNOSIS — E213 Hyperparathyroidism, unspecified: Secondary | ICD-10-CM | POA: Diagnosis not present

## 2016-11-11 DIAGNOSIS — Z888 Allergy status to other drugs, medicaments and biological substances status: Secondary | ICD-10-CM | POA: Diagnosis not present

## 2016-11-11 DIAGNOSIS — I120 Hypertensive chronic kidney disease with stage 5 chronic kidney disease or end stage renal disease: Secondary | ICD-10-CM | POA: Diagnosis not present

## 2016-11-11 DIAGNOSIS — Z79899 Other long term (current) drug therapy: Secondary | ICD-10-CM | POA: Diagnosis not present

## 2016-11-11 DIAGNOSIS — N2581 Secondary hyperparathyroidism of renal origin: Secondary | ICD-10-CM | POA: Diagnosis not present

## 2016-11-12 DIAGNOSIS — Z114 Encounter for screening for human immunodeficiency virus [HIV]: Secondary | ICD-10-CM | POA: Diagnosis not present

## 2016-11-12 DIAGNOSIS — D631 Anemia in chronic kidney disease: Secondary | ICD-10-CM | POA: Diagnosis not present

## 2016-11-12 DIAGNOSIS — D509 Iron deficiency anemia, unspecified: Secondary | ICD-10-CM | POA: Diagnosis not present

## 2016-11-12 DIAGNOSIS — N186 End stage renal disease: Secondary | ICD-10-CM | POA: Diagnosis not present

## 2016-11-12 DIAGNOSIS — N2581 Secondary hyperparathyroidism of renal origin: Secondary | ICD-10-CM | POA: Diagnosis not present

## 2016-11-15 DIAGNOSIS — D509 Iron deficiency anemia, unspecified: Secondary | ICD-10-CM | POA: Diagnosis not present

## 2016-11-15 DIAGNOSIS — N2581 Secondary hyperparathyroidism of renal origin: Secondary | ICD-10-CM | POA: Diagnosis not present

## 2016-11-15 DIAGNOSIS — D631 Anemia in chronic kidney disease: Secondary | ICD-10-CM | POA: Diagnosis not present

## 2016-11-15 DIAGNOSIS — N186 End stage renal disease: Secondary | ICD-10-CM | POA: Diagnosis not present

## 2016-11-17 DIAGNOSIS — D631 Anemia in chronic kidney disease: Secondary | ICD-10-CM | POA: Diagnosis not present

## 2016-11-17 DIAGNOSIS — D509 Iron deficiency anemia, unspecified: Secondary | ICD-10-CM | POA: Diagnosis not present

## 2016-11-17 DIAGNOSIS — N2581 Secondary hyperparathyroidism of renal origin: Secondary | ICD-10-CM | POA: Diagnosis not present

## 2016-11-17 DIAGNOSIS — N186 End stage renal disease: Secondary | ICD-10-CM | POA: Diagnosis not present

## 2016-11-19 DIAGNOSIS — N2581 Secondary hyperparathyroidism of renal origin: Secondary | ICD-10-CM | POA: Diagnosis not present

## 2016-11-19 DIAGNOSIS — D631 Anemia in chronic kidney disease: Secondary | ICD-10-CM | POA: Diagnosis not present

## 2016-11-19 DIAGNOSIS — D509 Iron deficiency anemia, unspecified: Secondary | ICD-10-CM | POA: Diagnosis not present

## 2016-11-19 DIAGNOSIS — N186 End stage renal disease: Secondary | ICD-10-CM | POA: Diagnosis not present

## 2016-11-22 DIAGNOSIS — N2581 Secondary hyperparathyroidism of renal origin: Secondary | ICD-10-CM | POA: Diagnosis not present

## 2016-11-22 DIAGNOSIS — N186 End stage renal disease: Secondary | ICD-10-CM | POA: Diagnosis not present

## 2016-11-22 DIAGNOSIS — D631 Anemia in chronic kidney disease: Secondary | ICD-10-CM | POA: Diagnosis not present

## 2016-11-22 DIAGNOSIS — D509 Iron deficiency anemia, unspecified: Secondary | ICD-10-CM | POA: Diagnosis not present

## 2016-11-23 DIAGNOSIS — N186 End stage renal disease: Secondary | ICD-10-CM | POA: Diagnosis not present

## 2016-11-23 DIAGNOSIS — D631 Anemia in chronic kidney disease: Secondary | ICD-10-CM | POA: Diagnosis not present

## 2016-11-23 DIAGNOSIS — N2581 Secondary hyperparathyroidism of renal origin: Secondary | ICD-10-CM | POA: Diagnosis not present

## 2016-11-23 DIAGNOSIS — D509 Iron deficiency anemia, unspecified: Secondary | ICD-10-CM | POA: Diagnosis not present

## 2016-11-25 DIAGNOSIS — J111 Influenza due to unidentified influenza virus with other respiratory manifestations: Secondary | ICD-10-CM | POA: Diagnosis not present

## 2016-11-25 DIAGNOSIS — J22 Unspecified acute lower respiratory infection: Secondary | ICD-10-CM | POA: Diagnosis not present

## 2016-11-25 DIAGNOSIS — R05 Cough: Secondary | ICD-10-CM | POA: Diagnosis not present

## 2016-11-25 DIAGNOSIS — N185 Chronic kidney disease, stage 5: Secondary | ICD-10-CM | POA: Diagnosis not present

## 2016-11-26 DIAGNOSIS — N186 End stage renal disease: Secondary | ICD-10-CM | POA: Diagnosis not present

## 2016-11-26 DIAGNOSIS — N2581 Secondary hyperparathyroidism of renal origin: Secondary | ICD-10-CM | POA: Diagnosis not present

## 2016-11-26 DIAGNOSIS — D509 Iron deficiency anemia, unspecified: Secondary | ICD-10-CM | POA: Diagnosis not present

## 2016-11-26 DIAGNOSIS — D631 Anemia in chronic kidney disease: Secondary | ICD-10-CM | POA: Diagnosis not present

## 2016-11-29 DIAGNOSIS — D631 Anemia in chronic kidney disease: Secondary | ICD-10-CM | POA: Diagnosis not present

## 2016-11-29 DIAGNOSIS — N186 End stage renal disease: Secondary | ICD-10-CM | POA: Diagnosis not present

## 2016-11-29 DIAGNOSIS — N2581 Secondary hyperparathyroidism of renal origin: Secondary | ICD-10-CM | POA: Diagnosis not present

## 2016-11-29 DIAGNOSIS — D509 Iron deficiency anemia, unspecified: Secondary | ICD-10-CM | POA: Diagnosis not present

## 2016-12-01 DIAGNOSIS — D631 Anemia in chronic kidney disease: Secondary | ICD-10-CM | POA: Diagnosis not present

## 2016-12-01 DIAGNOSIS — D509 Iron deficiency anemia, unspecified: Secondary | ICD-10-CM | POA: Diagnosis not present

## 2016-12-01 DIAGNOSIS — N2581 Secondary hyperparathyroidism of renal origin: Secondary | ICD-10-CM | POA: Diagnosis not present

## 2016-12-01 DIAGNOSIS — N186 End stage renal disease: Secondary | ICD-10-CM | POA: Diagnosis not present

## 2016-12-01 DIAGNOSIS — Z992 Dependence on renal dialysis: Secondary | ICD-10-CM | POA: Diagnosis not present

## 2016-12-03 DIAGNOSIS — D509 Iron deficiency anemia, unspecified: Secondary | ICD-10-CM | POA: Diagnosis not present

## 2016-12-03 DIAGNOSIS — D631 Anemia in chronic kidney disease: Secondary | ICD-10-CM | POA: Diagnosis not present

## 2016-12-03 DIAGNOSIS — N186 End stage renal disease: Secondary | ICD-10-CM | POA: Diagnosis not present

## 2016-12-03 DIAGNOSIS — D6869 Other thrombophilia: Secondary | ICD-10-CM | POA: Diagnosis not present

## 2016-12-03 DIAGNOSIS — Z9089 Acquired absence of other organs: Secondary | ICD-10-CM | POA: Diagnosis not present

## 2016-12-06 DIAGNOSIS — D631 Anemia in chronic kidney disease: Secondary | ICD-10-CM | POA: Diagnosis not present

## 2016-12-06 DIAGNOSIS — Z9089 Acquired absence of other organs: Secondary | ICD-10-CM | POA: Diagnosis not present

## 2016-12-06 DIAGNOSIS — D509 Iron deficiency anemia, unspecified: Secondary | ICD-10-CM | POA: Diagnosis not present

## 2016-12-06 DIAGNOSIS — D6869 Other thrombophilia: Secondary | ICD-10-CM | POA: Diagnosis not present

## 2016-12-06 DIAGNOSIS — N186 End stage renal disease: Secondary | ICD-10-CM | POA: Diagnosis not present

## 2016-12-07 DIAGNOSIS — D6869 Other thrombophilia: Secondary | ICD-10-CM | POA: Diagnosis not present

## 2016-12-07 DIAGNOSIS — I4581 Long QT syndrome: Secondary | ICD-10-CM | POA: Diagnosis not present

## 2016-12-07 DIAGNOSIS — N2581 Secondary hyperparathyroidism of renal origin: Secondary | ICD-10-CM | POA: Diagnosis not present

## 2016-12-07 DIAGNOSIS — D696 Thrombocytopenia, unspecified: Secondary | ICD-10-CM | POA: Diagnosis not present

## 2016-12-07 DIAGNOSIS — I120 Hypertensive chronic kidney disease with stage 5 chronic kidney disease or end stage renal disease: Secondary | ICD-10-CM | POA: Diagnosis not present

## 2016-12-07 DIAGNOSIS — R202 Paresthesia of skin: Secondary | ICD-10-CM | POA: Diagnosis not present

## 2016-12-07 DIAGNOSIS — Z992 Dependence on renal dialysis: Secondary | ICD-10-CM | POA: Diagnosis not present

## 2016-12-07 DIAGNOSIS — D631 Anemia in chronic kidney disease: Secondary | ICD-10-CM | POA: Diagnosis not present

## 2016-12-07 DIAGNOSIS — N186 End stage renal disease: Secondary | ICD-10-CM | POA: Diagnosis not present

## 2016-12-07 DIAGNOSIS — R490 Dysphonia: Secondary | ICD-10-CM | POA: Diagnosis not present

## 2016-12-07 DIAGNOSIS — Z9089 Acquired absence of other organs: Secondary | ICD-10-CM | POA: Diagnosis not present

## 2016-12-07 DIAGNOSIS — E875 Hyperkalemia: Secondary | ICD-10-CM | POA: Diagnosis not present

## 2016-12-07 DIAGNOSIS — R200 Anesthesia of skin: Secondary | ICD-10-CM | POA: Diagnosis not present

## 2016-12-07 DIAGNOSIS — Z888 Allergy status to other drugs, medicaments and biological substances status: Secondary | ICD-10-CM | POA: Diagnosis not present

## 2016-12-07 DIAGNOSIS — D509 Iron deficiency anemia, unspecified: Secondary | ICD-10-CM | POA: Diagnosis not present

## 2016-12-07 DIAGNOSIS — D693 Immune thrombocytopenic purpura: Secondary | ICD-10-CM | POA: Diagnosis not present

## 2016-12-08 DIAGNOSIS — D696 Thrombocytopenia, unspecified: Secondary | ICD-10-CM | POA: Diagnosis not present

## 2016-12-08 DIAGNOSIS — D693 Immune thrombocytopenic purpura: Secondary | ICD-10-CM | POA: Diagnosis not present

## 2016-12-08 DIAGNOSIS — N186 End stage renal disease: Secondary | ICD-10-CM | POA: Diagnosis not present

## 2016-12-08 DIAGNOSIS — E875 Hyperkalemia: Secondary | ICD-10-CM | POA: Diagnosis not present

## 2016-12-08 DIAGNOSIS — N2581 Secondary hyperparathyroidism of renal origin: Secondary | ICD-10-CM | POA: Diagnosis not present

## 2016-12-08 DIAGNOSIS — Z992 Dependence on renal dialysis: Secondary | ICD-10-CM | POA: Diagnosis not present

## 2016-12-08 DIAGNOSIS — E213 Hyperparathyroidism, unspecified: Secondary | ICD-10-CM | POA: Diagnosis not present

## 2016-12-08 DIAGNOSIS — I120 Hypertensive chronic kidney disease with stage 5 chronic kidney disease or end stage renal disease: Secondary | ICD-10-CM | POA: Diagnosis not present

## 2016-12-08 DIAGNOSIS — E212 Other hyperparathyroidism: Secondary | ICD-10-CM | POA: Diagnosis not present

## 2016-12-09 DIAGNOSIS — E875 Hyperkalemia: Secondary | ICD-10-CM | POA: Diagnosis not present

## 2016-12-09 DIAGNOSIS — Z992 Dependence on renal dialysis: Secondary | ICD-10-CM | POA: Diagnosis not present

## 2016-12-09 DIAGNOSIS — N2581 Secondary hyperparathyroidism of renal origin: Secondary | ICD-10-CM | POA: Diagnosis not present

## 2016-12-09 DIAGNOSIS — N186 End stage renal disease: Secondary | ICD-10-CM | POA: Diagnosis not present

## 2016-12-09 DIAGNOSIS — E213 Hyperparathyroidism, unspecified: Secondary | ICD-10-CM | POA: Diagnosis not present

## 2016-12-09 DIAGNOSIS — I120 Hypertensive chronic kidney disease with stage 5 chronic kidney disease or end stage renal disease: Secondary | ICD-10-CM | POA: Diagnosis not present

## 2016-12-09 DIAGNOSIS — D693 Immune thrombocytopenic purpura: Secondary | ICD-10-CM | POA: Diagnosis not present

## 2016-12-09 DIAGNOSIS — D696 Thrombocytopenia, unspecified: Secondary | ICD-10-CM | POA: Diagnosis not present

## 2016-12-09 DIAGNOSIS — R9431 Abnormal electrocardiogram [ECG] [EKG]: Secondary | ICD-10-CM | POA: Diagnosis not present

## 2016-12-10 DIAGNOSIS — Z01810 Encounter for preprocedural cardiovascular examination: Secondary | ICD-10-CM | POA: Diagnosis not present

## 2016-12-10 DIAGNOSIS — Z992 Dependence on renal dialysis: Secondary | ICD-10-CM | POA: Diagnosis not present

## 2016-12-10 DIAGNOSIS — E213 Hyperparathyroidism, unspecified: Secondary | ICD-10-CM | POA: Diagnosis not present

## 2016-12-10 DIAGNOSIS — E875 Hyperkalemia: Secondary | ICD-10-CM | POA: Diagnosis not present

## 2016-12-10 DIAGNOSIS — N186 End stage renal disease: Secondary | ICD-10-CM | POA: Diagnosis not present

## 2016-12-10 DIAGNOSIS — E878 Other disorders of electrolyte and fluid balance, not elsewhere classified: Secondary | ICD-10-CM | POA: Diagnosis not present

## 2016-12-12 DIAGNOSIS — N186 End stage renal disease: Secondary | ICD-10-CM | POA: Diagnosis not present

## 2016-12-12 DIAGNOSIS — Z992 Dependence on renal dialysis: Secondary | ICD-10-CM | POA: Diagnosis not present

## 2016-12-12 DIAGNOSIS — E878 Other disorders of electrolyte and fluid balance, not elsewhere classified: Secondary | ICD-10-CM | POA: Diagnosis not present

## 2016-12-12 DIAGNOSIS — E875 Hyperkalemia: Secondary | ICD-10-CM | POA: Diagnosis not present

## 2016-12-13 DIAGNOSIS — D696 Thrombocytopenia, unspecified: Secondary | ICD-10-CM | POA: Diagnosis not present

## 2016-12-13 DIAGNOSIS — E213 Hyperparathyroidism, unspecified: Secondary | ICD-10-CM | POA: Diagnosis not present

## 2016-12-13 DIAGNOSIS — Z992 Dependence on renal dialysis: Secondary | ICD-10-CM | POA: Diagnosis not present

## 2016-12-13 DIAGNOSIS — I120 Hypertensive chronic kidney disease with stage 5 chronic kidney disease or end stage renal disease: Secondary | ICD-10-CM | POA: Diagnosis not present

## 2016-12-13 DIAGNOSIS — Z888 Allergy status to other drugs, medicaments and biological substances status: Secondary | ICD-10-CM | POA: Diagnosis not present

## 2016-12-13 DIAGNOSIS — R509 Fever, unspecified: Secondary | ICD-10-CM | POA: Diagnosis not present

## 2016-12-13 DIAGNOSIS — E875 Hyperkalemia: Secondary | ICD-10-CM | POA: Diagnosis not present

## 2016-12-13 DIAGNOSIS — E878 Other disorders of electrolyte and fluid balance, not elsewhere classified: Secondary | ICD-10-CM | POA: Diagnosis not present

## 2016-12-13 DIAGNOSIS — R490 Dysphonia: Secondary | ICD-10-CM | POA: Diagnosis not present

## 2016-12-13 DIAGNOSIS — R202 Paresthesia of skin: Secondary | ICD-10-CM | POA: Diagnosis not present

## 2016-12-13 DIAGNOSIS — N186 End stage renal disease: Secondary | ICD-10-CM | POA: Diagnosis not present

## 2016-12-13 DIAGNOSIS — R200 Anesthesia of skin: Secondary | ICD-10-CM | POA: Diagnosis not present

## 2016-12-13 DIAGNOSIS — I4581 Long QT syndrome: Secondary | ICD-10-CM | POA: Diagnosis not present

## 2016-12-13 DIAGNOSIS — I517 Cardiomegaly: Secondary | ICD-10-CM | POA: Diagnosis not present

## 2016-12-13 DIAGNOSIS — D693 Immune thrombocytopenic purpura: Secondary | ICD-10-CM | POA: Diagnosis not present

## 2016-12-13 DIAGNOSIS — N2581 Secondary hyperparathyroidism of renal origin: Secondary | ICD-10-CM | POA: Diagnosis not present

## 2016-12-20 DIAGNOSIS — D6869 Other thrombophilia: Secondary | ICD-10-CM | POA: Diagnosis not present

## 2016-12-20 DIAGNOSIS — Z9089 Acquired absence of other organs: Secondary | ICD-10-CM | POA: Diagnosis not present

## 2016-12-20 DIAGNOSIS — D631 Anemia in chronic kidney disease: Secondary | ICD-10-CM | POA: Diagnosis not present

## 2016-12-20 DIAGNOSIS — D509 Iron deficiency anemia, unspecified: Secondary | ICD-10-CM | POA: Diagnosis not present

## 2016-12-20 DIAGNOSIS — N186 End stage renal disease: Secondary | ICD-10-CM | POA: Diagnosis not present

## 2016-12-24 DIAGNOSIS — D631 Anemia in chronic kidney disease: Secondary | ICD-10-CM | POA: Diagnosis not present

## 2016-12-24 DIAGNOSIS — N186 End stage renal disease: Secondary | ICD-10-CM | POA: Diagnosis not present

## 2016-12-24 DIAGNOSIS — D509 Iron deficiency anemia, unspecified: Secondary | ICD-10-CM | POA: Diagnosis not present

## 2016-12-24 DIAGNOSIS — Z9089 Acquired absence of other organs: Secondary | ICD-10-CM | POA: Diagnosis not present

## 2016-12-24 DIAGNOSIS — Z1159 Encounter for screening for other viral diseases: Secondary | ICD-10-CM | POA: Diagnosis not present

## 2016-12-24 DIAGNOSIS — D6869 Other thrombophilia: Secondary | ICD-10-CM | POA: Diagnosis not present

## 2016-12-27 DIAGNOSIS — N186 End stage renal disease: Secondary | ICD-10-CM | POA: Diagnosis not present

## 2016-12-27 DIAGNOSIS — D631 Anemia in chronic kidney disease: Secondary | ICD-10-CM | POA: Diagnosis not present

## 2016-12-27 DIAGNOSIS — M545 Low back pain: Secondary | ICD-10-CM | POA: Diagnosis not present

## 2016-12-27 DIAGNOSIS — G4700 Insomnia, unspecified: Secondary | ICD-10-CM | POA: Diagnosis not present

## 2016-12-27 DIAGNOSIS — I120 Hypertensive chronic kidney disease with stage 5 chronic kidney disease or end stage renal disease: Secondary | ICD-10-CM | POA: Diagnosis not present

## 2016-12-27 DIAGNOSIS — D6869 Other thrombophilia: Secondary | ICD-10-CM | POA: Diagnosis not present

## 2016-12-27 DIAGNOSIS — Z9089 Acquired absence of other organs: Secondary | ICD-10-CM | POA: Diagnosis not present

## 2016-12-27 DIAGNOSIS — F419 Anxiety disorder, unspecified: Secondary | ICD-10-CM | POA: Diagnosis not present

## 2016-12-27 DIAGNOSIS — F900 Attention-deficit hyperactivity disorder, predominantly inattentive type: Secondary | ICD-10-CM | POA: Diagnosis not present

## 2016-12-27 DIAGNOSIS — D509 Iron deficiency anemia, unspecified: Secondary | ICD-10-CM | POA: Diagnosis not present

## 2016-12-29 DIAGNOSIS — N186 End stage renal disease: Secondary | ICD-10-CM | POA: Diagnosis not present

## 2016-12-29 DIAGNOSIS — D631 Anemia in chronic kidney disease: Secondary | ICD-10-CM | POA: Diagnosis not present

## 2016-12-29 DIAGNOSIS — Z9089 Acquired absence of other organs: Secondary | ICD-10-CM | POA: Diagnosis not present

## 2016-12-29 DIAGNOSIS — D6869 Other thrombophilia: Secondary | ICD-10-CM | POA: Diagnosis not present

## 2016-12-29 DIAGNOSIS — D509 Iron deficiency anemia, unspecified: Secondary | ICD-10-CM | POA: Diagnosis not present

## 2016-12-31 DIAGNOSIS — Z9089 Acquired absence of other organs: Secondary | ICD-10-CM | POA: Diagnosis not present

## 2016-12-31 DIAGNOSIS — N186 End stage renal disease: Secondary | ICD-10-CM | POA: Diagnosis not present

## 2016-12-31 DIAGNOSIS — D631 Anemia in chronic kidney disease: Secondary | ICD-10-CM | POA: Diagnosis not present

## 2016-12-31 DIAGNOSIS — D6869 Other thrombophilia: Secondary | ICD-10-CM | POA: Diagnosis not present

## 2016-12-31 DIAGNOSIS — D509 Iron deficiency anemia, unspecified: Secondary | ICD-10-CM | POA: Diagnosis not present

## 2017-01-01 DIAGNOSIS — N186 End stage renal disease: Secondary | ICD-10-CM | POA: Diagnosis not present

## 2017-01-01 DIAGNOSIS — Z992 Dependence on renal dialysis: Secondary | ICD-10-CM | POA: Diagnosis not present

## 2017-01-03 DIAGNOSIS — D631 Anemia in chronic kidney disease: Secondary | ICD-10-CM | POA: Diagnosis not present

## 2017-01-03 DIAGNOSIS — Z9089 Acquired absence of other organs: Secondary | ICD-10-CM | POA: Diagnosis not present

## 2017-01-03 DIAGNOSIS — N186 End stage renal disease: Secondary | ICD-10-CM | POA: Diagnosis not present

## 2017-01-03 DIAGNOSIS — E8779 Other fluid overload: Secondary | ICD-10-CM | POA: Diagnosis not present

## 2017-01-03 DIAGNOSIS — D509 Iron deficiency anemia, unspecified: Secondary | ICD-10-CM | POA: Diagnosis not present

## 2017-01-03 DIAGNOSIS — N2581 Secondary hyperparathyroidism of renal origin: Secondary | ICD-10-CM | POA: Diagnosis not present

## 2017-01-04 DIAGNOSIS — D509 Iron deficiency anemia, unspecified: Secondary | ICD-10-CM | POA: Diagnosis not present

## 2017-01-04 DIAGNOSIS — Z9089 Acquired absence of other organs: Secondary | ICD-10-CM | POA: Diagnosis not present

## 2017-01-04 DIAGNOSIS — E8779 Other fluid overload: Secondary | ICD-10-CM | POA: Diagnosis not present

## 2017-01-04 DIAGNOSIS — N2581 Secondary hyperparathyroidism of renal origin: Secondary | ICD-10-CM | POA: Diagnosis not present

## 2017-01-04 DIAGNOSIS — N186 End stage renal disease: Secondary | ICD-10-CM | POA: Diagnosis not present

## 2017-01-04 DIAGNOSIS — D631 Anemia in chronic kidney disease: Secondary | ICD-10-CM | POA: Diagnosis not present

## 2017-01-06 DIAGNOSIS — D509 Iron deficiency anemia, unspecified: Secondary | ICD-10-CM | POA: Diagnosis not present

## 2017-01-06 DIAGNOSIS — Z9089 Acquired absence of other organs: Secondary | ICD-10-CM | POA: Diagnosis not present

## 2017-01-06 DIAGNOSIS — E8779 Other fluid overload: Secondary | ICD-10-CM | POA: Diagnosis not present

## 2017-01-06 DIAGNOSIS — D631 Anemia in chronic kidney disease: Secondary | ICD-10-CM | POA: Diagnosis not present

## 2017-01-06 DIAGNOSIS — N2581 Secondary hyperparathyroidism of renal origin: Secondary | ICD-10-CM | POA: Diagnosis not present

## 2017-01-06 DIAGNOSIS — I10 Essential (primary) hypertension: Secondary | ICD-10-CM | POA: Diagnosis not present

## 2017-01-06 DIAGNOSIS — E039 Hypothyroidism, unspecified: Secondary | ICD-10-CM | POA: Diagnosis not present

## 2017-01-06 DIAGNOSIS — N186 End stage renal disease: Secondary | ICD-10-CM | POA: Diagnosis not present

## 2017-01-07 DIAGNOSIS — N2581 Secondary hyperparathyroidism of renal origin: Secondary | ICD-10-CM | POA: Diagnosis not present

## 2017-01-07 DIAGNOSIS — Z9089 Acquired absence of other organs: Secondary | ICD-10-CM | POA: Diagnosis not present

## 2017-01-07 DIAGNOSIS — N186 End stage renal disease: Secondary | ICD-10-CM | POA: Diagnosis not present

## 2017-01-07 DIAGNOSIS — D509 Iron deficiency anemia, unspecified: Secondary | ICD-10-CM | POA: Diagnosis not present

## 2017-01-07 DIAGNOSIS — E8779 Other fluid overload: Secondary | ICD-10-CM | POA: Diagnosis not present

## 2017-01-07 DIAGNOSIS — D631 Anemia in chronic kidney disease: Secondary | ICD-10-CM | POA: Diagnosis not present

## 2017-01-10 DIAGNOSIS — E8779 Other fluid overload: Secondary | ICD-10-CM | POA: Diagnosis not present

## 2017-01-10 DIAGNOSIS — D631 Anemia in chronic kidney disease: Secondary | ICD-10-CM | POA: Diagnosis not present

## 2017-01-10 DIAGNOSIS — D509 Iron deficiency anemia, unspecified: Secondary | ICD-10-CM | POA: Diagnosis not present

## 2017-01-10 DIAGNOSIS — N2581 Secondary hyperparathyroidism of renal origin: Secondary | ICD-10-CM | POA: Diagnosis not present

## 2017-01-10 DIAGNOSIS — N186 End stage renal disease: Secondary | ICD-10-CM | POA: Diagnosis not present

## 2017-01-10 DIAGNOSIS — Z9089 Acquired absence of other organs: Secondary | ICD-10-CM | POA: Diagnosis not present

## 2017-01-12 DIAGNOSIS — N2581 Secondary hyperparathyroidism of renal origin: Secondary | ICD-10-CM | POA: Diagnosis not present

## 2017-01-12 DIAGNOSIS — D631 Anemia in chronic kidney disease: Secondary | ICD-10-CM | POA: Diagnosis not present

## 2017-01-12 DIAGNOSIS — Z9089 Acquired absence of other organs: Secondary | ICD-10-CM | POA: Diagnosis not present

## 2017-01-12 DIAGNOSIS — N186 End stage renal disease: Secondary | ICD-10-CM | POA: Diagnosis not present

## 2017-01-12 DIAGNOSIS — D509 Iron deficiency anemia, unspecified: Secondary | ICD-10-CM | POA: Diagnosis not present

## 2017-01-12 DIAGNOSIS — E8779 Other fluid overload: Secondary | ICD-10-CM | POA: Diagnosis not present

## 2017-01-14 DIAGNOSIS — N2581 Secondary hyperparathyroidism of renal origin: Secondary | ICD-10-CM | POA: Diagnosis not present

## 2017-01-14 DIAGNOSIS — N186 End stage renal disease: Secondary | ICD-10-CM | POA: Diagnosis not present

## 2017-01-14 DIAGNOSIS — D631 Anemia in chronic kidney disease: Secondary | ICD-10-CM | POA: Diagnosis not present

## 2017-01-14 DIAGNOSIS — E8779 Other fluid overload: Secondary | ICD-10-CM | POA: Diagnosis not present

## 2017-01-14 DIAGNOSIS — Z9089 Acquired absence of other organs: Secondary | ICD-10-CM | POA: Diagnosis not present

## 2017-01-14 DIAGNOSIS — D509 Iron deficiency anemia, unspecified: Secondary | ICD-10-CM | POA: Diagnosis not present

## 2017-01-18 DIAGNOSIS — N2581 Secondary hyperparathyroidism of renal origin: Secondary | ICD-10-CM | POA: Diagnosis not present

## 2017-01-18 DIAGNOSIS — D509 Iron deficiency anemia, unspecified: Secondary | ICD-10-CM | POA: Diagnosis not present

## 2017-01-18 DIAGNOSIS — Z9089 Acquired absence of other organs: Secondary | ICD-10-CM | POA: Diagnosis not present

## 2017-01-18 DIAGNOSIS — N186 End stage renal disease: Secondary | ICD-10-CM | POA: Diagnosis not present

## 2017-01-18 DIAGNOSIS — E8779 Other fluid overload: Secondary | ICD-10-CM | POA: Diagnosis not present

## 2017-01-18 DIAGNOSIS — D631 Anemia in chronic kidney disease: Secondary | ICD-10-CM | POA: Diagnosis not present

## 2017-01-19 DIAGNOSIS — Z9089 Acquired absence of other organs: Secondary | ICD-10-CM | POA: Diagnosis not present

## 2017-01-19 DIAGNOSIS — D509 Iron deficiency anemia, unspecified: Secondary | ICD-10-CM | POA: Diagnosis not present

## 2017-01-19 DIAGNOSIS — N186 End stage renal disease: Secondary | ICD-10-CM | POA: Diagnosis not present

## 2017-01-19 DIAGNOSIS — E8779 Other fluid overload: Secondary | ICD-10-CM | POA: Diagnosis not present

## 2017-01-19 DIAGNOSIS — N2581 Secondary hyperparathyroidism of renal origin: Secondary | ICD-10-CM | POA: Diagnosis not present

## 2017-01-19 DIAGNOSIS — D631 Anemia in chronic kidney disease: Secondary | ICD-10-CM | POA: Diagnosis not present

## 2017-01-21 DIAGNOSIS — E8779 Other fluid overload: Secondary | ICD-10-CM | POA: Diagnosis not present

## 2017-01-21 DIAGNOSIS — N2581 Secondary hyperparathyroidism of renal origin: Secondary | ICD-10-CM | POA: Diagnosis not present

## 2017-01-21 DIAGNOSIS — D509 Iron deficiency anemia, unspecified: Secondary | ICD-10-CM | POA: Diagnosis not present

## 2017-01-21 DIAGNOSIS — D631 Anemia in chronic kidney disease: Secondary | ICD-10-CM | POA: Diagnosis not present

## 2017-01-21 DIAGNOSIS — Z9089 Acquired absence of other organs: Secondary | ICD-10-CM | POA: Diagnosis not present

## 2017-01-21 DIAGNOSIS — N186 End stage renal disease: Secondary | ICD-10-CM | POA: Diagnosis not present

## 2017-01-24 DIAGNOSIS — N2581 Secondary hyperparathyroidism of renal origin: Secondary | ICD-10-CM | POA: Diagnosis not present

## 2017-01-24 DIAGNOSIS — D631 Anemia in chronic kidney disease: Secondary | ICD-10-CM | POA: Diagnosis not present

## 2017-01-24 DIAGNOSIS — N186 End stage renal disease: Secondary | ICD-10-CM | POA: Diagnosis not present

## 2017-01-24 DIAGNOSIS — Z9089 Acquired absence of other organs: Secondary | ICD-10-CM | POA: Diagnosis not present

## 2017-01-24 DIAGNOSIS — E8779 Other fluid overload: Secondary | ICD-10-CM | POA: Diagnosis not present

## 2017-01-24 DIAGNOSIS — D509 Iron deficiency anemia, unspecified: Secondary | ICD-10-CM | POA: Diagnosis not present

## 2017-01-27 DIAGNOSIS — N2581 Secondary hyperparathyroidism of renal origin: Secondary | ICD-10-CM | POA: Diagnosis not present

## 2017-01-27 DIAGNOSIS — N186 End stage renal disease: Secondary | ICD-10-CM | POA: Diagnosis not present

## 2017-01-27 DIAGNOSIS — D631 Anemia in chronic kidney disease: Secondary | ICD-10-CM | POA: Diagnosis not present

## 2017-01-27 DIAGNOSIS — D509 Iron deficiency anemia, unspecified: Secondary | ICD-10-CM | POA: Diagnosis not present

## 2017-01-27 DIAGNOSIS — E8779 Other fluid overload: Secondary | ICD-10-CM | POA: Diagnosis not present

## 2017-01-27 DIAGNOSIS — Z9089 Acquired absence of other organs: Secondary | ICD-10-CM | POA: Diagnosis not present

## 2017-01-28 DIAGNOSIS — D631 Anemia in chronic kidney disease: Secondary | ICD-10-CM | POA: Diagnosis not present

## 2017-01-28 DIAGNOSIS — Z9089 Acquired absence of other organs: Secondary | ICD-10-CM | POA: Diagnosis not present

## 2017-01-28 DIAGNOSIS — D509 Iron deficiency anemia, unspecified: Secondary | ICD-10-CM | POA: Diagnosis not present

## 2017-01-28 DIAGNOSIS — N186 End stage renal disease: Secondary | ICD-10-CM | POA: Diagnosis not present

## 2017-01-28 DIAGNOSIS — E8779 Other fluid overload: Secondary | ICD-10-CM | POA: Diagnosis not present

## 2017-01-28 DIAGNOSIS — N2581 Secondary hyperparathyroidism of renal origin: Secondary | ICD-10-CM | POA: Diagnosis not present

## 2017-01-31 DIAGNOSIS — D509 Iron deficiency anemia, unspecified: Secondary | ICD-10-CM | POA: Diagnosis not present

## 2017-01-31 DIAGNOSIS — Z9089 Acquired absence of other organs: Secondary | ICD-10-CM | POA: Diagnosis not present

## 2017-01-31 DIAGNOSIS — D631 Anemia in chronic kidney disease: Secondary | ICD-10-CM | POA: Diagnosis not present

## 2017-01-31 DIAGNOSIS — E8779 Other fluid overload: Secondary | ICD-10-CM | POA: Diagnosis not present

## 2017-01-31 DIAGNOSIS — N186 End stage renal disease: Secondary | ICD-10-CM | POA: Diagnosis not present

## 2017-01-31 DIAGNOSIS — Z992 Dependence on renal dialysis: Secondary | ICD-10-CM | POA: Diagnosis not present

## 2017-01-31 DIAGNOSIS — N2581 Secondary hyperparathyroidism of renal origin: Secondary | ICD-10-CM | POA: Diagnosis not present

## 2017-02-01 DIAGNOSIS — N2581 Secondary hyperparathyroidism of renal origin: Secondary | ICD-10-CM | POA: Diagnosis not present

## 2017-02-01 DIAGNOSIS — N186 End stage renal disease: Secondary | ICD-10-CM | POA: Diagnosis not present

## 2017-02-01 DIAGNOSIS — D509 Iron deficiency anemia, unspecified: Secondary | ICD-10-CM | POA: Diagnosis not present

## 2017-02-01 DIAGNOSIS — D631 Anemia in chronic kidney disease: Secondary | ICD-10-CM | POA: Diagnosis not present

## 2017-02-04 DIAGNOSIS — N2581 Secondary hyperparathyroidism of renal origin: Secondary | ICD-10-CM | POA: Diagnosis not present

## 2017-02-04 DIAGNOSIS — N186 End stage renal disease: Secondary | ICD-10-CM | POA: Diagnosis not present

## 2017-02-04 DIAGNOSIS — D509 Iron deficiency anemia, unspecified: Secondary | ICD-10-CM | POA: Diagnosis not present

## 2017-02-04 DIAGNOSIS — D631 Anemia in chronic kidney disease: Secondary | ICD-10-CM | POA: Diagnosis not present

## 2017-02-08 DIAGNOSIS — N2581 Secondary hyperparathyroidism of renal origin: Secondary | ICD-10-CM | POA: Diagnosis not present

## 2017-02-08 DIAGNOSIS — D509 Iron deficiency anemia, unspecified: Secondary | ICD-10-CM | POA: Diagnosis not present

## 2017-02-08 DIAGNOSIS — D631 Anemia in chronic kidney disease: Secondary | ICD-10-CM | POA: Diagnosis not present

## 2017-02-08 DIAGNOSIS — N186 End stage renal disease: Secondary | ICD-10-CM | POA: Diagnosis not present

## 2017-02-10 DIAGNOSIS — D509 Iron deficiency anemia, unspecified: Secondary | ICD-10-CM | POA: Diagnosis not present

## 2017-02-10 DIAGNOSIS — N2581 Secondary hyperparathyroidism of renal origin: Secondary | ICD-10-CM | POA: Diagnosis not present

## 2017-02-10 DIAGNOSIS — D631 Anemia in chronic kidney disease: Secondary | ICD-10-CM | POA: Diagnosis not present

## 2017-02-10 DIAGNOSIS — N186 End stage renal disease: Secondary | ICD-10-CM | POA: Diagnosis not present

## 2017-02-11 DIAGNOSIS — D509 Iron deficiency anemia, unspecified: Secondary | ICD-10-CM | POA: Diagnosis not present

## 2017-02-11 DIAGNOSIS — N186 End stage renal disease: Secondary | ICD-10-CM | POA: Diagnosis not present

## 2017-02-11 DIAGNOSIS — D631 Anemia in chronic kidney disease: Secondary | ICD-10-CM | POA: Diagnosis not present

## 2017-02-11 DIAGNOSIS — N2581 Secondary hyperparathyroidism of renal origin: Secondary | ICD-10-CM | POA: Diagnosis not present

## 2017-02-14 DIAGNOSIS — N186 End stage renal disease: Secondary | ICD-10-CM | POA: Diagnosis not present

## 2017-02-14 DIAGNOSIS — D631 Anemia in chronic kidney disease: Secondary | ICD-10-CM | POA: Diagnosis not present

## 2017-02-14 DIAGNOSIS — N2581 Secondary hyperparathyroidism of renal origin: Secondary | ICD-10-CM | POA: Diagnosis not present

## 2017-02-14 DIAGNOSIS — D509 Iron deficiency anemia, unspecified: Secondary | ICD-10-CM | POA: Diagnosis not present

## 2017-02-15 DIAGNOSIS — Z9889 Other specified postprocedural states: Secondary | ICD-10-CM | POA: Diagnosis not present

## 2017-02-15 DIAGNOSIS — F419 Anxiety disorder, unspecified: Secondary | ICD-10-CM | POA: Diagnosis not present

## 2017-02-15 DIAGNOSIS — N186 End stage renal disease: Secondary | ICD-10-CM | POA: Diagnosis not present

## 2017-02-15 DIAGNOSIS — Z992 Dependence on renal dialysis: Secondary | ICD-10-CM | POA: Diagnosis not present

## 2017-02-15 DIAGNOSIS — R110 Nausea: Secondary | ICD-10-CM | POA: Diagnosis not present

## 2017-02-15 DIAGNOSIS — K5900 Constipation, unspecified: Secondary | ICD-10-CM | POA: Diagnosis not present

## 2017-02-15 DIAGNOSIS — N2581 Secondary hyperparathyroidism of renal origin: Secondary | ICD-10-CM | POA: Diagnosis not present

## 2017-02-15 DIAGNOSIS — R109 Unspecified abdominal pain: Secondary | ICD-10-CM | POA: Diagnosis not present

## 2017-02-15 DIAGNOSIS — E211 Secondary hyperparathyroidism, not elsewhere classified: Secondary | ICD-10-CM | POA: Diagnosis not present

## 2017-02-15 DIAGNOSIS — D631 Anemia in chronic kidney disease: Secondary | ICD-10-CM | POA: Diagnosis not present

## 2017-02-15 DIAGNOSIS — I120 Hypertensive chronic kidney disease with stage 5 chronic kidney disease or end stage renal disease: Secondary | ICD-10-CM | POA: Diagnosis not present

## 2017-02-15 DIAGNOSIS — R9431 Abnormal electrocardiogram [ECG] [EKG]: Secondary | ICD-10-CM | POA: Diagnosis not present

## 2017-02-15 DIAGNOSIS — K921 Melena: Secondary | ICD-10-CM | POA: Diagnosis not present

## 2017-02-15 DIAGNOSIS — R5383 Other fatigue: Secondary | ICD-10-CM | POA: Diagnosis not present

## 2017-02-16 DIAGNOSIS — Z79899 Other long term (current) drug therapy: Secondary | ICD-10-CM | POA: Diagnosis not present

## 2017-02-16 DIAGNOSIS — I120 Hypertensive chronic kidney disease with stage 5 chronic kidney disease or end stage renal disease: Secondary | ICD-10-CM | POA: Diagnosis not present

## 2017-02-16 DIAGNOSIS — K5900 Constipation, unspecified: Secondary | ICD-10-CM | POA: Diagnosis not present

## 2017-02-16 DIAGNOSIS — G8929 Other chronic pain: Secondary | ICD-10-CM | POA: Diagnosis not present

## 2017-02-16 DIAGNOSIS — I10 Essential (primary) hypertension: Secondary | ICD-10-CM | POA: Diagnosis not present

## 2017-02-16 DIAGNOSIS — Z992 Dependence on renal dialysis: Secondary | ICD-10-CM | POA: Diagnosis not present

## 2017-02-16 DIAGNOSIS — N186 End stage renal disease: Secondary | ICD-10-CM | POA: Diagnosis not present

## 2017-02-17 DIAGNOSIS — D631 Anemia in chronic kidney disease: Secondary | ICD-10-CM | POA: Diagnosis not present

## 2017-02-17 DIAGNOSIS — Z9889 Other specified postprocedural states: Secondary | ICD-10-CM | POA: Diagnosis not present

## 2017-02-17 DIAGNOSIS — Z992 Dependence on renal dialysis: Secondary | ICD-10-CM | POA: Diagnosis not present

## 2017-02-17 DIAGNOSIS — N186 End stage renal disease: Secondary | ICD-10-CM | POA: Diagnosis not present

## 2017-02-17 DIAGNOSIS — K5900 Constipation, unspecified: Secondary | ICD-10-CM | POA: Diagnosis not present

## 2017-02-18 DIAGNOSIS — N186 End stage renal disease: Secondary | ICD-10-CM | POA: Diagnosis not present

## 2017-02-18 DIAGNOSIS — D509 Iron deficiency anemia, unspecified: Secondary | ICD-10-CM | POA: Diagnosis not present

## 2017-02-18 DIAGNOSIS — N2581 Secondary hyperparathyroidism of renal origin: Secondary | ICD-10-CM | POA: Diagnosis not present

## 2017-02-18 DIAGNOSIS — D631 Anemia in chronic kidney disease: Secondary | ICD-10-CM | POA: Diagnosis not present

## 2017-02-21 DIAGNOSIS — D631 Anemia in chronic kidney disease: Secondary | ICD-10-CM | POA: Diagnosis not present

## 2017-02-21 DIAGNOSIS — N2581 Secondary hyperparathyroidism of renal origin: Secondary | ICD-10-CM | POA: Diagnosis not present

## 2017-02-21 DIAGNOSIS — D509 Iron deficiency anemia, unspecified: Secondary | ICD-10-CM | POA: Diagnosis not present

## 2017-02-21 DIAGNOSIS — N186 End stage renal disease: Secondary | ICD-10-CM | POA: Diagnosis not present

## 2017-02-23 DIAGNOSIS — D631 Anemia in chronic kidney disease: Secondary | ICD-10-CM | POA: Diagnosis not present

## 2017-02-23 DIAGNOSIS — N2581 Secondary hyperparathyroidism of renal origin: Secondary | ICD-10-CM | POA: Diagnosis not present

## 2017-02-23 DIAGNOSIS — N186 End stage renal disease: Secondary | ICD-10-CM | POA: Diagnosis not present

## 2017-02-23 DIAGNOSIS — D509 Iron deficiency anemia, unspecified: Secondary | ICD-10-CM | POA: Diagnosis not present

## 2017-02-25 DIAGNOSIS — N186 End stage renal disease: Secondary | ICD-10-CM | POA: Diagnosis not present

## 2017-02-25 DIAGNOSIS — N2581 Secondary hyperparathyroidism of renal origin: Secondary | ICD-10-CM | POA: Diagnosis not present

## 2017-02-25 DIAGNOSIS — D509 Iron deficiency anemia, unspecified: Secondary | ICD-10-CM | POA: Diagnosis not present

## 2017-02-25 DIAGNOSIS — D631 Anemia in chronic kidney disease: Secondary | ICD-10-CM | POA: Diagnosis not present

## 2017-02-28 DIAGNOSIS — D631 Anemia in chronic kidney disease: Secondary | ICD-10-CM | POA: Diagnosis not present

## 2017-02-28 DIAGNOSIS — N186 End stage renal disease: Secondary | ICD-10-CM | POA: Diagnosis not present

## 2017-02-28 DIAGNOSIS — D509 Iron deficiency anemia, unspecified: Secondary | ICD-10-CM | POA: Diagnosis not present

## 2017-02-28 DIAGNOSIS — N2581 Secondary hyperparathyroidism of renal origin: Secondary | ICD-10-CM | POA: Diagnosis not present

## 2017-03-02 DIAGNOSIS — N2581 Secondary hyperparathyroidism of renal origin: Secondary | ICD-10-CM | POA: Diagnosis not present

## 2017-03-02 DIAGNOSIS — D631 Anemia in chronic kidney disease: Secondary | ICD-10-CM | POA: Diagnosis not present

## 2017-03-02 DIAGNOSIS — N186 End stage renal disease: Secondary | ICD-10-CM | POA: Diagnosis not present

## 2017-03-02 DIAGNOSIS — D509 Iron deficiency anemia, unspecified: Secondary | ICD-10-CM | POA: Diagnosis not present

## 2017-03-03 DIAGNOSIS — N186 End stage renal disease: Secondary | ICD-10-CM | POA: Diagnosis not present

## 2017-03-03 DIAGNOSIS — Z992 Dependence on renal dialysis: Secondary | ICD-10-CM | POA: Diagnosis not present

## 2017-03-04 DIAGNOSIS — N186 End stage renal disease: Secondary | ICD-10-CM | POA: Diagnosis not present

## 2017-03-04 DIAGNOSIS — D631 Anemia in chronic kidney disease: Secondary | ICD-10-CM | POA: Diagnosis not present

## 2017-03-07 DIAGNOSIS — N186 End stage renal disease: Secondary | ICD-10-CM | POA: Diagnosis not present

## 2017-03-07 DIAGNOSIS — D631 Anemia in chronic kidney disease: Secondary | ICD-10-CM | POA: Diagnosis not present

## 2017-03-09 DIAGNOSIS — N186 End stage renal disease: Secondary | ICD-10-CM | POA: Diagnosis not present

## 2017-03-09 DIAGNOSIS — D631 Anemia in chronic kidney disease: Secondary | ICD-10-CM | POA: Diagnosis not present

## 2017-03-11 DIAGNOSIS — D631 Anemia in chronic kidney disease: Secondary | ICD-10-CM | POA: Diagnosis not present

## 2017-03-11 DIAGNOSIS — N186 End stage renal disease: Secondary | ICD-10-CM | POA: Diagnosis not present

## 2017-03-15 DIAGNOSIS — N186 End stage renal disease: Secondary | ICD-10-CM | POA: Diagnosis not present

## 2017-03-15 DIAGNOSIS — D631 Anemia in chronic kidney disease: Secondary | ICD-10-CM | POA: Diagnosis not present

## 2017-03-16 DIAGNOSIS — N186 End stage renal disease: Secondary | ICD-10-CM | POA: Diagnosis not present

## 2017-03-16 DIAGNOSIS — D631 Anemia in chronic kidney disease: Secondary | ICD-10-CM | POA: Diagnosis not present

## 2017-03-18 DIAGNOSIS — N186 End stage renal disease: Secondary | ICD-10-CM | POA: Diagnosis not present

## 2017-03-18 DIAGNOSIS — D631 Anemia in chronic kidney disease: Secondary | ICD-10-CM | POA: Diagnosis not present

## 2017-03-21 DIAGNOSIS — N186 End stage renal disease: Secondary | ICD-10-CM | POA: Diagnosis not present

## 2017-03-21 DIAGNOSIS — D631 Anemia in chronic kidney disease: Secondary | ICD-10-CM | POA: Diagnosis not present

## 2017-03-22 DIAGNOSIS — Z0000 Encounter for general adult medical examination without abnormal findings: Secondary | ICD-10-CM | POA: Diagnosis not present

## 2017-03-22 DIAGNOSIS — F900 Attention-deficit hyperactivity disorder, predominantly inattentive type: Secondary | ICD-10-CM | POA: Diagnosis not present

## 2017-03-22 DIAGNOSIS — N529 Male erectile dysfunction, unspecified: Secondary | ICD-10-CM | POA: Diagnosis not present

## 2017-03-22 DIAGNOSIS — F419 Anxiety disorder, unspecified: Secondary | ICD-10-CM | POA: Diagnosis not present

## 2017-03-22 DIAGNOSIS — Z1211 Encounter for screening for malignant neoplasm of colon: Secondary | ICD-10-CM | POA: Diagnosis not present

## 2017-03-22 DIAGNOSIS — I120 Hypertensive chronic kidney disease with stage 5 chronic kidney disease or end stage renal disease: Secondary | ICD-10-CM | POA: Diagnosis not present

## 2017-03-22 DIAGNOSIS — M545 Low back pain: Secondary | ICD-10-CM | POA: Diagnosis not present

## 2017-03-22 DIAGNOSIS — G4700 Insomnia, unspecified: Secondary | ICD-10-CM | POA: Diagnosis not present

## 2017-03-23 DIAGNOSIS — N186 End stage renal disease: Secondary | ICD-10-CM | POA: Diagnosis not present

## 2017-03-23 DIAGNOSIS — D631 Anemia in chronic kidney disease: Secondary | ICD-10-CM | POA: Diagnosis not present

## 2017-03-26 DIAGNOSIS — N186 End stage renal disease: Secondary | ICD-10-CM | POA: Diagnosis not present

## 2017-03-26 DIAGNOSIS — D631 Anemia in chronic kidney disease: Secondary | ICD-10-CM | POA: Diagnosis not present

## 2017-03-28 DIAGNOSIS — N186 End stage renal disease: Secondary | ICD-10-CM | POA: Diagnosis not present

## 2017-03-28 DIAGNOSIS — D631 Anemia in chronic kidney disease: Secondary | ICD-10-CM | POA: Diagnosis not present

## 2017-03-30 DIAGNOSIS — N186 End stage renal disease: Secondary | ICD-10-CM | POA: Diagnosis not present

## 2017-03-30 DIAGNOSIS — D631 Anemia in chronic kidney disease: Secondary | ICD-10-CM | POA: Diagnosis not present

## 2017-04-01 DIAGNOSIS — N186 End stage renal disease: Secondary | ICD-10-CM | POA: Diagnosis not present

## 2017-04-01 DIAGNOSIS — D631 Anemia in chronic kidney disease: Secondary | ICD-10-CM | POA: Diagnosis not present

## 2017-04-02 DIAGNOSIS — N186 End stage renal disease: Secondary | ICD-10-CM | POA: Diagnosis not present

## 2017-04-02 DIAGNOSIS — Z992 Dependence on renal dialysis: Secondary | ICD-10-CM | POA: Diagnosis not present

## 2017-04-04 DIAGNOSIS — D631 Anemia in chronic kidney disease: Secondary | ICD-10-CM | POA: Diagnosis not present

## 2017-04-04 DIAGNOSIS — N186 End stage renal disease: Secondary | ICD-10-CM | POA: Diagnosis not present

## 2017-04-04 DIAGNOSIS — N2581 Secondary hyperparathyroidism of renal origin: Secondary | ICD-10-CM | POA: Diagnosis not present

## 2017-04-04 DIAGNOSIS — D509 Iron deficiency anemia, unspecified: Secondary | ICD-10-CM | POA: Diagnosis not present

## 2017-04-06 DIAGNOSIS — I10 Essential (primary) hypertension: Secondary | ICD-10-CM | POA: Diagnosis not present

## 2017-04-06 DIAGNOSIS — N186 End stage renal disease: Secondary | ICD-10-CM | POA: Diagnosis not present

## 2017-04-06 DIAGNOSIS — D509 Iron deficiency anemia, unspecified: Secondary | ICD-10-CM | POA: Diagnosis not present

## 2017-04-06 DIAGNOSIS — D631 Anemia in chronic kidney disease: Secondary | ICD-10-CM | POA: Diagnosis not present

## 2017-04-06 DIAGNOSIS — N2581 Secondary hyperparathyroidism of renal origin: Secondary | ICD-10-CM | POA: Diagnosis not present

## 2017-04-06 DIAGNOSIS — E039 Hypothyroidism, unspecified: Secondary | ICD-10-CM | POA: Diagnosis not present

## 2017-04-08 DIAGNOSIS — N186 End stage renal disease: Secondary | ICD-10-CM | POA: Diagnosis not present

## 2017-04-08 DIAGNOSIS — N2581 Secondary hyperparathyroidism of renal origin: Secondary | ICD-10-CM | POA: Diagnosis not present

## 2017-04-08 DIAGNOSIS — D509 Iron deficiency anemia, unspecified: Secondary | ICD-10-CM | POA: Diagnosis not present

## 2017-04-08 DIAGNOSIS — D631 Anemia in chronic kidney disease: Secondary | ICD-10-CM | POA: Diagnosis not present

## 2017-04-11 DIAGNOSIS — N2581 Secondary hyperparathyroidism of renal origin: Secondary | ICD-10-CM | POA: Diagnosis not present

## 2017-04-11 DIAGNOSIS — N186 End stage renal disease: Secondary | ICD-10-CM | POA: Diagnosis not present

## 2017-04-11 DIAGNOSIS — D631 Anemia in chronic kidney disease: Secondary | ICD-10-CM | POA: Diagnosis not present

## 2017-04-11 DIAGNOSIS — D509 Iron deficiency anemia, unspecified: Secondary | ICD-10-CM | POA: Diagnosis not present

## 2017-04-12 DIAGNOSIS — Z992 Dependence on renal dialysis: Secondary | ICD-10-CM | POA: Diagnosis not present

## 2017-04-12 DIAGNOSIS — K5900 Constipation, unspecified: Secondary | ICD-10-CM | POA: Diagnosis not present

## 2017-04-12 DIAGNOSIS — D631 Anemia in chronic kidney disease: Secondary | ICD-10-CM | POA: Diagnosis not present

## 2017-04-12 DIAGNOSIS — Z79899 Other long term (current) drug therapy: Secondary | ICD-10-CM | POA: Diagnosis not present

## 2017-04-12 DIAGNOSIS — I120 Hypertensive chronic kidney disease with stage 5 chronic kidney disease or end stage renal disease: Secondary | ICD-10-CM | POA: Diagnosis not present

## 2017-04-12 DIAGNOSIS — N186 End stage renal disease: Secondary | ICD-10-CM | POA: Diagnosis not present

## 2017-04-13 DIAGNOSIS — N186 End stage renal disease: Secondary | ICD-10-CM | POA: Diagnosis not present

## 2017-04-13 DIAGNOSIS — D509 Iron deficiency anemia, unspecified: Secondary | ICD-10-CM | POA: Diagnosis not present

## 2017-04-13 DIAGNOSIS — D696 Thrombocytopenia, unspecified: Secondary | ICD-10-CM | POA: Diagnosis not present

## 2017-04-13 DIAGNOSIS — N2581 Secondary hyperparathyroidism of renal origin: Secondary | ICD-10-CM | POA: Diagnosis not present

## 2017-04-13 DIAGNOSIS — D631 Anemia in chronic kidney disease: Secondary | ICD-10-CM | POA: Diagnosis not present

## 2017-04-15 DIAGNOSIS — D509 Iron deficiency anemia, unspecified: Secondary | ICD-10-CM | POA: Diagnosis not present

## 2017-04-15 DIAGNOSIS — N2581 Secondary hyperparathyroidism of renal origin: Secondary | ICD-10-CM | POA: Diagnosis not present

## 2017-04-15 DIAGNOSIS — D631 Anemia in chronic kidney disease: Secondary | ICD-10-CM | POA: Diagnosis not present

## 2017-04-15 DIAGNOSIS — N186 End stage renal disease: Secondary | ICD-10-CM | POA: Diagnosis not present

## 2017-04-18 DIAGNOSIS — N186 End stage renal disease: Secondary | ICD-10-CM | POA: Diagnosis not present

## 2017-04-18 DIAGNOSIS — D631 Anemia in chronic kidney disease: Secondary | ICD-10-CM | POA: Diagnosis not present

## 2017-04-18 DIAGNOSIS — D509 Iron deficiency anemia, unspecified: Secondary | ICD-10-CM | POA: Diagnosis not present

## 2017-04-18 DIAGNOSIS — N2581 Secondary hyperparathyroidism of renal origin: Secondary | ICD-10-CM | POA: Diagnosis not present

## 2017-04-20 DIAGNOSIS — D509 Iron deficiency anemia, unspecified: Secondary | ICD-10-CM | POA: Diagnosis not present

## 2017-04-20 DIAGNOSIS — D631 Anemia in chronic kidney disease: Secondary | ICD-10-CM | POA: Diagnosis not present

## 2017-04-20 DIAGNOSIS — N2581 Secondary hyperparathyroidism of renal origin: Secondary | ICD-10-CM | POA: Diagnosis not present

## 2017-04-20 DIAGNOSIS — N186 End stage renal disease: Secondary | ICD-10-CM | POA: Diagnosis not present

## 2017-04-22 DIAGNOSIS — D631 Anemia in chronic kidney disease: Secondary | ICD-10-CM | POA: Diagnosis not present

## 2017-04-22 DIAGNOSIS — D509 Iron deficiency anemia, unspecified: Secondary | ICD-10-CM | POA: Diagnosis not present

## 2017-04-22 DIAGNOSIS — N186 End stage renal disease: Secondary | ICD-10-CM | POA: Diagnosis not present

## 2017-04-22 DIAGNOSIS — N2581 Secondary hyperparathyroidism of renal origin: Secondary | ICD-10-CM | POA: Diagnosis not present

## 2017-04-25 DIAGNOSIS — D509 Iron deficiency anemia, unspecified: Secondary | ICD-10-CM | POA: Diagnosis not present

## 2017-04-25 DIAGNOSIS — D631 Anemia in chronic kidney disease: Secondary | ICD-10-CM | POA: Diagnosis not present

## 2017-04-25 DIAGNOSIS — N2581 Secondary hyperparathyroidism of renal origin: Secondary | ICD-10-CM | POA: Diagnosis not present

## 2017-04-25 DIAGNOSIS — N186 End stage renal disease: Secondary | ICD-10-CM | POA: Diagnosis not present

## 2017-04-28 DIAGNOSIS — N2581 Secondary hyperparathyroidism of renal origin: Secondary | ICD-10-CM | POA: Diagnosis not present

## 2017-04-28 DIAGNOSIS — D631 Anemia in chronic kidney disease: Secondary | ICD-10-CM | POA: Diagnosis not present

## 2017-04-28 DIAGNOSIS — D509 Iron deficiency anemia, unspecified: Secondary | ICD-10-CM | POA: Diagnosis not present

## 2017-04-28 DIAGNOSIS — N186 End stage renal disease: Secondary | ICD-10-CM | POA: Diagnosis not present

## 2017-04-30 DIAGNOSIS — D509 Iron deficiency anemia, unspecified: Secondary | ICD-10-CM | POA: Diagnosis not present

## 2017-04-30 DIAGNOSIS — N2581 Secondary hyperparathyroidism of renal origin: Secondary | ICD-10-CM | POA: Diagnosis not present

## 2017-04-30 DIAGNOSIS — N186 End stage renal disease: Secondary | ICD-10-CM | POA: Diagnosis not present

## 2017-04-30 DIAGNOSIS — D631 Anemia in chronic kidney disease: Secondary | ICD-10-CM | POA: Diagnosis not present

## 2017-05-02 DIAGNOSIS — D509 Iron deficiency anemia, unspecified: Secondary | ICD-10-CM | POA: Diagnosis not present

## 2017-05-02 DIAGNOSIS — D631 Anemia in chronic kidney disease: Secondary | ICD-10-CM | POA: Diagnosis not present

## 2017-05-02 DIAGNOSIS — N2581 Secondary hyperparathyroidism of renal origin: Secondary | ICD-10-CM | POA: Diagnosis not present

## 2017-05-02 DIAGNOSIS — N186 End stage renal disease: Secondary | ICD-10-CM | POA: Diagnosis not present

## 2017-05-03 DIAGNOSIS — N186 End stage renal disease: Secondary | ICD-10-CM | POA: Diagnosis not present

## 2017-05-03 DIAGNOSIS — Z992 Dependence on renal dialysis: Secondary | ICD-10-CM | POA: Diagnosis not present

## 2017-05-06 DIAGNOSIS — N2581 Secondary hyperparathyroidism of renal origin: Secondary | ICD-10-CM | POA: Diagnosis not present

## 2017-05-06 DIAGNOSIS — D631 Anemia in chronic kidney disease: Secondary | ICD-10-CM | POA: Diagnosis not present

## 2017-05-06 DIAGNOSIS — D509 Iron deficiency anemia, unspecified: Secondary | ICD-10-CM | POA: Diagnosis not present

## 2017-05-06 DIAGNOSIS — N186 End stage renal disease: Secondary | ICD-10-CM | POA: Diagnosis not present

## 2017-05-09 DIAGNOSIS — N2581 Secondary hyperparathyroidism of renal origin: Secondary | ICD-10-CM | POA: Diagnosis not present

## 2017-05-09 DIAGNOSIS — D509 Iron deficiency anemia, unspecified: Secondary | ICD-10-CM | POA: Diagnosis not present

## 2017-05-09 DIAGNOSIS — N186 End stage renal disease: Secondary | ICD-10-CM | POA: Diagnosis not present

## 2017-05-09 DIAGNOSIS — D631 Anemia in chronic kidney disease: Secondary | ICD-10-CM | POA: Diagnosis not present

## 2017-05-11 DIAGNOSIS — D509 Iron deficiency anemia, unspecified: Secondary | ICD-10-CM | POA: Diagnosis not present

## 2017-05-11 DIAGNOSIS — N186 End stage renal disease: Secondary | ICD-10-CM | POA: Diagnosis not present

## 2017-05-11 DIAGNOSIS — N2581 Secondary hyperparathyroidism of renal origin: Secondary | ICD-10-CM | POA: Diagnosis not present

## 2017-05-11 DIAGNOSIS — D631 Anemia in chronic kidney disease: Secondary | ICD-10-CM | POA: Diagnosis not present

## 2017-05-14 DIAGNOSIS — N2581 Secondary hyperparathyroidism of renal origin: Secondary | ICD-10-CM | POA: Diagnosis not present

## 2017-05-14 DIAGNOSIS — D509 Iron deficiency anemia, unspecified: Secondary | ICD-10-CM | POA: Diagnosis not present

## 2017-05-14 DIAGNOSIS — D631 Anemia in chronic kidney disease: Secondary | ICD-10-CM | POA: Diagnosis not present

## 2017-05-14 DIAGNOSIS — N186 End stage renal disease: Secondary | ICD-10-CM | POA: Diagnosis not present

## 2017-05-16 DIAGNOSIS — N2581 Secondary hyperparathyroidism of renal origin: Secondary | ICD-10-CM | POA: Diagnosis not present

## 2017-05-16 DIAGNOSIS — E162 Hypoglycemia, unspecified: Secondary | ICD-10-CM | POA: Diagnosis not present

## 2017-05-16 DIAGNOSIS — D509 Iron deficiency anemia, unspecified: Secondary | ICD-10-CM | POA: Diagnosis not present

## 2017-05-16 DIAGNOSIS — N186 End stage renal disease: Secondary | ICD-10-CM | POA: Diagnosis not present

## 2017-05-16 DIAGNOSIS — D631 Anemia in chronic kidney disease: Secondary | ICD-10-CM | POA: Diagnosis not present

## 2017-05-18 DIAGNOSIS — N186 End stage renal disease: Secondary | ICD-10-CM | POA: Diagnosis not present

## 2017-05-18 DIAGNOSIS — D631 Anemia in chronic kidney disease: Secondary | ICD-10-CM | POA: Diagnosis not present

## 2017-05-18 DIAGNOSIS — N2581 Secondary hyperparathyroidism of renal origin: Secondary | ICD-10-CM | POA: Diagnosis not present

## 2017-05-18 DIAGNOSIS — D509 Iron deficiency anemia, unspecified: Secondary | ICD-10-CM | POA: Diagnosis not present

## 2017-05-21 DIAGNOSIS — N2581 Secondary hyperparathyroidism of renal origin: Secondary | ICD-10-CM | POA: Diagnosis not present

## 2017-05-21 DIAGNOSIS — D509 Iron deficiency anemia, unspecified: Secondary | ICD-10-CM | POA: Diagnosis not present

## 2017-05-21 DIAGNOSIS — N186 End stage renal disease: Secondary | ICD-10-CM | POA: Diagnosis not present

## 2017-05-21 DIAGNOSIS — D631 Anemia in chronic kidney disease: Secondary | ICD-10-CM | POA: Diagnosis not present

## 2017-05-24 DIAGNOSIS — N186 End stage renal disease: Secondary | ICD-10-CM | POA: Diagnosis not present

## 2017-05-24 DIAGNOSIS — D631 Anemia in chronic kidney disease: Secondary | ICD-10-CM | POA: Diagnosis not present

## 2017-05-24 DIAGNOSIS — D509 Iron deficiency anemia, unspecified: Secondary | ICD-10-CM | POA: Diagnosis not present

## 2017-05-24 DIAGNOSIS — N2581 Secondary hyperparathyroidism of renal origin: Secondary | ICD-10-CM | POA: Diagnosis not present

## 2017-05-27 DIAGNOSIS — D631 Anemia in chronic kidney disease: Secondary | ICD-10-CM | POA: Diagnosis not present

## 2017-05-27 DIAGNOSIS — N2581 Secondary hyperparathyroidism of renal origin: Secondary | ICD-10-CM | POA: Diagnosis not present

## 2017-05-27 DIAGNOSIS — D509 Iron deficiency anemia, unspecified: Secondary | ICD-10-CM | POA: Diagnosis not present

## 2017-05-27 DIAGNOSIS — N186 End stage renal disease: Secondary | ICD-10-CM | POA: Diagnosis not present

## 2017-05-30 DIAGNOSIS — D631 Anemia in chronic kidney disease: Secondary | ICD-10-CM | POA: Diagnosis not present

## 2017-05-30 DIAGNOSIS — N186 End stage renal disease: Secondary | ICD-10-CM | POA: Diagnosis not present

## 2017-05-30 DIAGNOSIS — N2581 Secondary hyperparathyroidism of renal origin: Secondary | ICD-10-CM | POA: Diagnosis not present

## 2017-05-30 DIAGNOSIS — D509 Iron deficiency anemia, unspecified: Secondary | ICD-10-CM | POA: Diagnosis not present

## 2017-06-01 DIAGNOSIS — D509 Iron deficiency anemia, unspecified: Secondary | ICD-10-CM | POA: Diagnosis not present

## 2017-06-01 DIAGNOSIS — D631 Anemia in chronic kidney disease: Secondary | ICD-10-CM | POA: Diagnosis not present

## 2017-06-01 DIAGNOSIS — N186 End stage renal disease: Secondary | ICD-10-CM | POA: Diagnosis not present

## 2017-06-01 DIAGNOSIS — N2581 Secondary hyperparathyroidism of renal origin: Secondary | ICD-10-CM | POA: Diagnosis not present

## 2017-06-03 DIAGNOSIS — N186 End stage renal disease: Secondary | ICD-10-CM | POA: Diagnosis not present

## 2017-06-03 DIAGNOSIS — Z992 Dependence on renal dialysis: Secondary | ICD-10-CM | POA: Diagnosis not present

## 2017-06-06 DIAGNOSIS — D631 Anemia in chronic kidney disease: Secondary | ICD-10-CM | POA: Diagnosis not present

## 2017-06-06 DIAGNOSIS — N186 End stage renal disease: Secondary | ICD-10-CM | POA: Diagnosis not present

## 2017-06-06 DIAGNOSIS — N2581 Secondary hyperparathyroidism of renal origin: Secondary | ICD-10-CM | POA: Diagnosis not present

## 2017-06-08 DIAGNOSIS — N186 End stage renal disease: Secondary | ICD-10-CM | POA: Diagnosis not present

## 2017-06-08 DIAGNOSIS — D631 Anemia in chronic kidney disease: Secondary | ICD-10-CM | POA: Diagnosis not present

## 2017-06-08 DIAGNOSIS — N2581 Secondary hyperparathyroidism of renal origin: Secondary | ICD-10-CM | POA: Diagnosis not present

## 2017-06-10 DIAGNOSIS — N2581 Secondary hyperparathyroidism of renal origin: Secondary | ICD-10-CM | POA: Diagnosis not present

## 2017-06-10 DIAGNOSIS — N186 End stage renal disease: Secondary | ICD-10-CM | POA: Diagnosis not present

## 2017-06-10 DIAGNOSIS — D631 Anemia in chronic kidney disease: Secondary | ICD-10-CM | POA: Diagnosis not present

## 2017-06-13 DIAGNOSIS — D631 Anemia in chronic kidney disease: Secondary | ICD-10-CM | POA: Diagnosis not present

## 2017-06-13 DIAGNOSIS — N2581 Secondary hyperparathyroidism of renal origin: Secondary | ICD-10-CM | POA: Diagnosis not present

## 2017-06-13 DIAGNOSIS — N186 End stage renal disease: Secondary | ICD-10-CM | POA: Diagnosis not present

## 2017-06-15 DIAGNOSIS — N186 End stage renal disease: Secondary | ICD-10-CM | POA: Diagnosis not present

## 2017-06-15 DIAGNOSIS — D631 Anemia in chronic kidney disease: Secondary | ICD-10-CM | POA: Diagnosis not present

## 2017-06-15 DIAGNOSIS — N2581 Secondary hyperparathyroidism of renal origin: Secondary | ICD-10-CM | POA: Diagnosis not present

## 2017-06-17 DIAGNOSIS — N2581 Secondary hyperparathyroidism of renal origin: Secondary | ICD-10-CM | POA: Diagnosis not present

## 2017-06-17 DIAGNOSIS — D631 Anemia in chronic kidney disease: Secondary | ICD-10-CM | POA: Diagnosis not present

## 2017-06-17 DIAGNOSIS — N186 End stage renal disease: Secondary | ICD-10-CM | POA: Diagnosis not present

## 2017-06-20 DIAGNOSIS — N2581 Secondary hyperparathyroidism of renal origin: Secondary | ICD-10-CM | POA: Diagnosis not present

## 2017-06-20 DIAGNOSIS — N186 End stage renal disease: Secondary | ICD-10-CM | POA: Diagnosis not present

## 2017-06-20 DIAGNOSIS — D631 Anemia in chronic kidney disease: Secondary | ICD-10-CM | POA: Diagnosis not present

## 2017-06-23 DIAGNOSIS — N186 End stage renal disease: Secondary | ICD-10-CM | POA: Diagnosis not present

## 2017-06-23 DIAGNOSIS — D631 Anemia in chronic kidney disease: Secondary | ICD-10-CM | POA: Diagnosis not present

## 2017-06-23 DIAGNOSIS — N2581 Secondary hyperparathyroidism of renal origin: Secondary | ICD-10-CM | POA: Diagnosis not present

## 2017-06-25 DIAGNOSIS — N2581 Secondary hyperparathyroidism of renal origin: Secondary | ICD-10-CM | POA: Diagnosis not present

## 2017-06-25 DIAGNOSIS — D631 Anemia in chronic kidney disease: Secondary | ICD-10-CM | POA: Diagnosis not present

## 2017-06-25 DIAGNOSIS — N186 End stage renal disease: Secondary | ICD-10-CM | POA: Diagnosis not present

## 2017-06-27 DIAGNOSIS — N186 End stage renal disease: Secondary | ICD-10-CM | POA: Diagnosis not present

## 2017-06-27 DIAGNOSIS — D631 Anemia in chronic kidney disease: Secondary | ICD-10-CM | POA: Diagnosis not present

## 2017-06-27 DIAGNOSIS — N2581 Secondary hyperparathyroidism of renal origin: Secondary | ICD-10-CM | POA: Diagnosis not present

## 2017-06-29 DIAGNOSIS — D631 Anemia in chronic kidney disease: Secondary | ICD-10-CM | POA: Diagnosis not present

## 2017-06-29 DIAGNOSIS — N2581 Secondary hyperparathyroidism of renal origin: Secondary | ICD-10-CM | POA: Diagnosis not present

## 2017-06-29 DIAGNOSIS — N186 End stage renal disease: Secondary | ICD-10-CM | POA: Diagnosis not present

## 2017-07-01 DIAGNOSIS — N2581 Secondary hyperparathyroidism of renal origin: Secondary | ICD-10-CM | POA: Diagnosis not present

## 2017-07-01 DIAGNOSIS — N186 End stage renal disease: Secondary | ICD-10-CM | POA: Diagnosis not present

## 2017-07-01 DIAGNOSIS — D631 Anemia in chronic kidney disease: Secondary | ICD-10-CM | POA: Diagnosis not present

## 2017-07-03 DIAGNOSIS — N186 End stage renal disease: Secondary | ICD-10-CM | POA: Diagnosis not present

## 2017-07-03 DIAGNOSIS — Z992 Dependence on renal dialysis: Secondary | ICD-10-CM | POA: Diagnosis not present

## 2017-07-04 DIAGNOSIS — Z23 Encounter for immunization: Secondary | ICD-10-CM | POA: Diagnosis not present

## 2017-07-04 DIAGNOSIS — N186 End stage renal disease: Secondary | ICD-10-CM | POA: Diagnosis not present

## 2017-07-04 DIAGNOSIS — D631 Anemia in chronic kidney disease: Secondary | ICD-10-CM | POA: Diagnosis not present

## 2017-07-04 DIAGNOSIS — N2581 Secondary hyperparathyroidism of renal origin: Secondary | ICD-10-CM | POA: Diagnosis not present

## 2017-07-06 DIAGNOSIS — N186 End stage renal disease: Secondary | ICD-10-CM | POA: Diagnosis not present

## 2017-07-06 DIAGNOSIS — N2581 Secondary hyperparathyroidism of renal origin: Secondary | ICD-10-CM | POA: Diagnosis not present

## 2017-07-06 DIAGNOSIS — E039 Hypothyroidism, unspecified: Secondary | ICD-10-CM | POA: Diagnosis not present

## 2017-07-06 DIAGNOSIS — I10 Essential (primary) hypertension: Secondary | ICD-10-CM | POA: Diagnosis not present

## 2017-07-06 DIAGNOSIS — D631 Anemia in chronic kidney disease: Secondary | ICD-10-CM | POA: Diagnosis not present

## 2017-07-06 DIAGNOSIS — Z23 Encounter for immunization: Secondary | ICD-10-CM | POA: Diagnosis not present

## 2017-07-07 DIAGNOSIS — D696 Thrombocytopenia, unspecified: Secondary | ICD-10-CM | POA: Diagnosis not present

## 2017-07-07 DIAGNOSIS — I10 Essential (primary) hypertension: Secondary | ICD-10-CM | POA: Diagnosis not present

## 2017-07-07 DIAGNOSIS — N200 Calculus of kidney: Secondary | ICD-10-CM | POA: Diagnosis not present

## 2017-07-07 DIAGNOSIS — N186 End stage renal disease: Secondary | ICD-10-CM | POA: Diagnosis not present

## 2017-07-07 DIAGNOSIS — I120 Hypertensive chronic kidney disease with stage 5 chronic kidney disease or end stage renal disease: Secondary | ICD-10-CM | POA: Diagnosis not present

## 2017-07-07 DIAGNOSIS — Z01818 Encounter for other preprocedural examination: Secondary | ICD-10-CM | POA: Diagnosis not present

## 2017-07-07 DIAGNOSIS — F419 Anxiety disorder, unspecified: Secondary | ICD-10-CM | POA: Diagnosis not present

## 2017-07-07 DIAGNOSIS — N2581 Secondary hyperparathyroidism of renal origin: Secondary | ICD-10-CM | POA: Diagnosis not present

## 2017-07-07 DIAGNOSIS — F329 Major depressive disorder, single episode, unspecified: Secondary | ICD-10-CM | POA: Diagnosis not present

## 2017-07-07 DIAGNOSIS — Z992 Dependence on renal dialysis: Secondary | ICD-10-CM | POA: Diagnosis not present

## 2017-07-08 DIAGNOSIS — N2581 Secondary hyperparathyroidism of renal origin: Secondary | ICD-10-CM | POA: Diagnosis not present

## 2017-07-08 DIAGNOSIS — N186 End stage renal disease: Secondary | ICD-10-CM | POA: Diagnosis not present

## 2017-07-08 DIAGNOSIS — D631 Anemia in chronic kidney disease: Secondary | ICD-10-CM | POA: Diagnosis not present

## 2017-07-08 DIAGNOSIS — Z23 Encounter for immunization: Secondary | ICD-10-CM | POA: Diagnosis not present

## 2017-07-11 DIAGNOSIS — Z23 Encounter for immunization: Secondary | ICD-10-CM | POA: Diagnosis not present

## 2017-07-11 DIAGNOSIS — N186 End stage renal disease: Secondary | ICD-10-CM | POA: Diagnosis not present

## 2017-07-11 DIAGNOSIS — D631 Anemia in chronic kidney disease: Secondary | ICD-10-CM | POA: Diagnosis not present

## 2017-07-11 DIAGNOSIS — N2581 Secondary hyperparathyroidism of renal origin: Secondary | ICD-10-CM | POA: Diagnosis not present

## 2017-07-13 DIAGNOSIS — N2581 Secondary hyperparathyroidism of renal origin: Secondary | ICD-10-CM | POA: Diagnosis not present

## 2017-07-13 DIAGNOSIS — Z23 Encounter for immunization: Secondary | ICD-10-CM | POA: Diagnosis not present

## 2017-07-13 DIAGNOSIS — D631 Anemia in chronic kidney disease: Secondary | ICD-10-CM | POA: Diagnosis not present

## 2017-07-13 DIAGNOSIS — N186 End stage renal disease: Secondary | ICD-10-CM | POA: Diagnosis not present

## 2017-07-14 DIAGNOSIS — D696 Thrombocytopenia, unspecified: Secondary | ICD-10-CM | POA: Diagnosis not present

## 2017-07-14 DIAGNOSIS — D649 Anemia, unspecified: Secondary | ICD-10-CM | POA: Diagnosis not present

## 2017-07-15 DIAGNOSIS — Z23 Encounter for immunization: Secondary | ICD-10-CM | POA: Diagnosis not present

## 2017-07-15 DIAGNOSIS — N2581 Secondary hyperparathyroidism of renal origin: Secondary | ICD-10-CM | POA: Diagnosis not present

## 2017-07-15 DIAGNOSIS — N186 End stage renal disease: Secondary | ICD-10-CM | POA: Diagnosis not present

## 2017-07-15 DIAGNOSIS — D631 Anemia in chronic kidney disease: Secondary | ICD-10-CM | POA: Diagnosis not present

## 2017-07-19 DIAGNOSIS — G4700 Insomnia, unspecified: Secondary | ICD-10-CM | POA: Diagnosis not present

## 2017-07-19 DIAGNOSIS — N185 Chronic kidney disease, stage 5: Secondary | ICD-10-CM | POA: Diagnosis not present

## 2017-07-19 DIAGNOSIS — F419 Anxiety disorder, unspecified: Secondary | ICD-10-CM | POA: Diagnosis not present

## 2017-07-19 DIAGNOSIS — M545 Low back pain: Secondary | ICD-10-CM | POA: Diagnosis not present

## 2017-07-19 DIAGNOSIS — F900 Attention-deficit hyperactivity disorder, predominantly inattentive type: Secondary | ICD-10-CM | POA: Diagnosis not present

## 2017-07-20 DIAGNOSIS — D631 Anemia in chronic kidney disease: Secondary | ICD-10-CM | POA: Diagnosis not present

## 2017-07-20 DIAGNOSIS — N186 End stage renal disease: Secondary | ICD-10-CM | POA: Diagnosis not present

## 2017-07-20 DIAGNOSIS — N2581 Secondary hyperparathyroidism of renal origin: Secondary | ICD-10-CM | POA: Diagnosis not present

## 2017-07-20 DIAGNOSIS — Z23 Encounter for immunization: Secondary | ICD-10-CM | POA: Diagnosis not present

## 2017-07-22 DIAGNOSIS — Z23 Encounter for immunization: Secondary | ICD-10-CM | POA: Diagnosis not present

## 2017-07-22 DIAGNOSIS — N2581 Secondary hyperparathyroidism of renal origin: Secondary | ICD-10-CM | POA: Diagnosis not present

## 2017-07-22 DIAGNOSIS — D631 Anemia in chronic kidney disease: Secondary | ICD-10-CM | POA: Diagnosis not present

## 2017-07-22 DIAGNOSIS — N186 End stage renal disease: Secondary | ICD-10-CM | POA: Diagnosis not present

## 2017-07-25 DIAGNOSIS — N186 End stage renal disease: Secondary | ICD-10-CM | POA: Diagnosis not present

## 2017-07-25 DIAGNOSIS — Z23 Encounter for immunization: Secondary | ICD-10-CM | POA: Diagnosis not present

## 2017-07-25 DIAGNOSIS — N2581 Secondary hyperparathyroidism of renal origin: Secondary | ICD-10-CM | POA: Diagnosis not present

## 2017-07-25 DIAGNOSIS — D631 Anemia in chronic kidney disease: Secondary | ICD-10-CM | POA: Diagnosis not present

## 2017-07-27 DIAGNOSIS — N2581 Secondary hyperparathyroidism of renal origin: Secondary | ICD-10-CM | POA: Diagnosis not present

## 2017-07-27 DIAGNOSIS — N186 End stage renal disease: Secondary | ICD-10-CM | POA: Diagnosis not present

## 2017-07-27 DIAGNOSIS — Z23 Encounter for immunization: Secondary | ICD-10-CM | POA: Diagnosis not present

## 2017-07-27 DIAGNOSIS — D631 Anemia in chronic kidney disease: Secondary | ICD-10-CM | POA: Diagnosis not present

## 2017-07-29 DIAGNOSIS — N186 End stage renal disease: Secondary | ICD-10-CM | POA: Diagnosis not present

## 2017-07-29 DIAGNOSIS — D631 Anemia in chronic kidney disease: Secondary | ICD-10-CM | POA: Diagnosis not present

## 2017-07-29 DIAGNOSIS — Z23 Encounter for immunization: Secondary | ICD-10-CM | POA: Diagnosis not present

## 2017-07-29 DIAGNOSIS — N2581 Secondary hyperparathyroidism of renal origin: Secondary | ICD-10-CM | POA: Diagnosis not present

## 2017-08-01 DIAGNOSIS — N2581 Secondary hyperparathyroidism of renal origin: Secondary | ICD-10-CM | POA: Diagnosis not present

## 2017-08-01 DIAGNOSIS — Z23 Encounter for immunization: Secondary | ICD-10-CM | POA: Diagnosis not present

## 2017-08-01 DIAGNOSIS — Z01818 Encounter for other preprocedural examination: Secondary | ICD-10-CM | POA: Diagnosis not present

## 2017-08-01 DIAGNOSIS — Z992 Dependence on renal dialysis: Secondary | ICD-10-CM | POA: Diagnosis not present

## 2017-08-01 DIAGNOSIS — D649 Anemia, unspecified: Secondary | ICD-10-CM | POA: Diagnosis not present

## 2017-08-01 DIAGNOSIS — D631 Anemia in chronic kidney disease: Secondary | ICD-10-CM | POA: Diagnosis not present

## 2017-08-01 DIAGNOSIS — I120 Hypertensive chronic kidney disease with stage 5 chronic kidney disease or end stage renal disease: Secondary | ICD-10-CM | POA: Diagnosis not present

## 2017-08-01 DIAGNOSIS — D696 Thrombocytopenia, unspecified: Secondary | ICD-10-CM | POA: Diagnosis not present

## 2017-08-01 DIAGNOSIS — N186 End stage renal disease: Secondary | ICD-10-CM | POA: Diagnosis not present

## 2017-08-03 DIAGNOSIS — Z23 Encounter for immunization: Secondary | ICD-10-CM | POA: Diagnosis not present

## 2017-08-03 DIAGNOSIS — N2581 Secondary hyperparathyroidism of renal origin: Secondary | ICD-10-CM | POA: Diagnosis not present

## 2017-08-03 DIAGNOSIS — Z992 Dependence on renal dialysis: Secondary | ICD-10-CM | POA: Diagnosis not present

## 2017-08-03 DIAGNOSIS — D631 Anemia in chronic kidney disease: Secondary | ICD-10-CM | POA: Diagnosis not present

## 2017-08-03 DIAGNOSIS — N186 End stage renal disease: Secondary | ICD-10-CM | POA: Diagnosis not present

## 2017-08-05 DIAGNOSIS — D631 Anemia in chronic kidney disease: Secondary | ICD-10-CM | POA: Diagnosis not present

## 2017-08-05 DIAGNOSIS — N186 End stage renal disease: Secondary | ICD-10-CM | POA: Diagnosis not present

## 2017-08-05 DIAGNOSIS — N2581 Secondary hyperparathyroidism of renal origin: Secondary | ICD-10-CM | POA: Diagnosis not present

## 2017-08-08 DIAGNOSIS — D631 Anemia in chronic kidney disease: Secondary | ICD-10-CM | POA: Diagnosis not present

## 2017-08-08 DIAGNOSIS — N186 End stage renal disease: Secondary | ICD-10-CM | POA: Diagnosis not present

## 2017-08-08 DIAGNOSIS — N2581 Secondary hyperparathyroidism of renal origin: Secondary | ICD-10-CM | POA: Diagnosis not present

## 2017-08-10 DIAGNOSIS — N186 End stage renal disease: Secondary | ICD-10-CM | POA: Diagnosis not present

## 2017-08-10 DIAGNOSIS — N2581 Secondary hyperparathyroidism of renal origin: Secondary | ICD-10-CM | POA: Diagnosis not present

## 2017-08-10 DIAGNOSIS — D631 Anemia in chronic kidney disease: Secondary | ICD-10-CM | POA: Diagnosis not present

## 2017-08-13 DIAGNOSIS — N186 End stage renal disease: Secondary | ICD-10-CM | POA: Diagnosis not present

## 2017-08-13 DIAGNOSIS — D631 Anemia in chronic kidney disease: Secondary | ICD-10-CM | POA: Diagnosis not present

## 2017-08-13 DIAGNOSIS — N2581 Secondary hyperparathyroidism of renal origin: Secondary | ICD-10-CM | POA: Diagnosis not present

## 2017-08-15 DIAGNOSIS — N2581 Secondary hyperparathyroidism of renal origin: Secondary | ICD-10-CM | POA: Diagnosis not present

## 2017-08-15 DIAGNOSIS — N186 End stage renal disease: Secondary | ICD-10-CM | POA: Diagnosis not present

## 2017-08-15 DIAGNOSIS — D631 Anemia in chronic kidney disease: Secondary | ICD-10-CM | POA: Diagnosis not present

## 2017-08-19 DIAGNOSIS — M79672 Pain in left foot: Secondary | ICD-10-CM | POA: Diagnosis not present

## 2017-08-19 DIAGNOSIS — N2581 Secondary hyperparathyroidism of renal origin: Secondary | ICD-10-CM | POA: Diagnosis not present

## 2017-08-19 DIAGNOSIS — S93402A Sprain of unspecified ligament of left ankle, initial encounter: Secondary | ICD-10-CM | POA: Diagnosis not present

## 2017-08-19 DIAGNOSIS — D631 Anemia in chronic kidney disease: Secondary | ICD-10-CM | POA: Diagnosis not present

## 2017-08-19 DIAGNOSIS — N186 End stage renal disease: Secondary | ICD-10-CM | POA: Diagnosis not present

## 2017-08-19 DIAGNOSIS — W19XXXA Unspecified fall, initial encounter: Secondary | ICD-10-CM | POA: Diagnosis not present

## 2017-08-21 DIAGNOSIS — N186 End stage renal disease: Secondary | ICD-10-CM | POA: Diagnosis not present

## 2017-08-21 DIAGNOSIS — N2581 Secondary hyperparathyroidism of renal origin: Secondary | ICD-10-CM | POA: Diagnosis not present

## 2017-08-21 DIAGNOSIS — D631 Anemia in chronic kidney disease: Secondary | ICD-10-CM | POA: Diagnosis not present

## 2017-08-23 DIAGNOSIS — D631 Anemia in chronic kidney disease: Secondary | ICD-10-CM | POA: Diagnosis not present

## 2017-08-23 DIAGNOSIS — N2581 Secondary hyperparathyroidism of renal origin: Secondary | ICD-10-CM | POA: Diagnosis not present

## 2017-08-23 DIAGNOSIS — N186 End stage renal disease: Secondary | ICD-10-CM | POA: Diagnosis not present

## 2017-08-25 DIAGNOSIS — R740 Nonspecific elevation of levels of transaminase and lactic acid dehydrogenase [LDH]: Secondary | ICD-10-CM | POA: Diagnosis not present

## 2017-08-25 DIAGNOSIS — Z9911 Dependence on respirator [ventilator] status: Secondary | ICD-10-CM | POA: Diagnosis not present

## 2017-08-25 DIAGNOSIS — I160 Hypertensive urgency: Secondary | ICD-10-CM | POA: Diagnosis not present

## 2017-08-25 DIAGNOSIS — D649 Anemia, unspecified: Secondary | ICD-10-CM | POA: Diagnosis not present

## 2017-08-25 DIAGNOSIS — G8929 Other chronic pain: Secondary | ICD-10-CM | POA: Diagnosis not present

## 2017-08-25 DIAGNOSIS — I63511 Cerebral infarction due to unspecified occlusion or stenosis of right middle cerebral artery: Secondary | ICD-10-CM | POA: Diagnosis not present

## 2017-08-25 DIAGNOSIS — G8928 Other chronic postprocedural pain: Secondary | ICD-10-CM | POA: Diagnosis not present

## 2017-08-25 DIAGNOSIS — E876 Hypokalemia: Secondary | ICD-10-CM | POA: Diagnosis not present

## 2017-08-25 DIAGNOSIS — M545 Low back pain: Secondary | ICD-10-CM | POA: Diagnosis present

## 2017-08-25 DIAGNOSIS — M25519 Pain in unspecified shoulder: Secondary | ICD-10-CM | POA: Diagnosis present

## 2017-08-25 DIAGNOSIS — R200 Anesthesia of skin: Secondary | ICD-10-CM | POA: Diagnosis present

## 2017-08-25 DIAGNOSIS — R449 Unspecified symptoms and signs involving general sensations and perceptions: Secondary | ICD-10-CM | POA: Diagnosis not present

## 2017-08-25 DIAGNOSIS — Z992 Dependence on renal dialysis: Secondary | ICD-10-CM | POA: Diagnosis not present

## 2017-08-25 DIAGNOSIS — R9431 Abnormal electrocardiogram [ECG] [EKG]: Secondary | ICD-10-CM | POA: Diagnosis not present

## 2017-08-25 DIAGNOSIS — R531 Weakness: Secondary | ICD-10-CM | POA: Diagnosis not present

## 2017-08-25 DIAGNOSIS — E213 Hyperparathyroidism, unspecified: Secondary | ICD-10-CM | POA: Diagnosis not present

## 2017-08-25 DIAGNOSIS — D696 Thrombocytopenia, unspecified: Secondary | ICD-10-CM | POA: Diagnosis not present

## 2017-08-25 DIAGNOSIS — H538 Other visual disturbances: Secondary | ICD-10-CM | POA: Diagnosis not present

## 2017-08-25 DIAGNOSIS — N186 End stage renal disease: Secondary | ICD-10-CM | POA: Diagnosis not present

## 2017-08-25 DIAGNOSIS — N2581 Secondary hyperparathyroidism of renal origin: Secondary | ICD-10-CM | POA: Diagnosis not present

## 2017-08-25 DIAGNOSIS — I120 Hypertensive chronic kidney disease with stage 5 chronic kidney disease or end stage renal disease: Secondary | ICD-10-CM | POA: Diagnosis not present

## 2017-08-25 DIAGNOSIS — H43392 Other vitreous opacities, left eye: Secondary | ICD-10-CM | POA: Diagnosis present

## 2017-08-25 DIAGNOSIS — R1310 Dysphagia, unspecified: Secondary | ICD-10-CM | POA: Diagnosis present

## 2017-08-25 DIAGNOSIS — I517 Cardiomegaly: Secondary | ICD-10-CM | POA: Diagnosis not present

## 2017-08-25 DIAGNOSIS — R29705 NIHSS score 5: Secondary | ICD-10-CM | POA: Diagnosis not present

## 2017-08-25 DIAGNOSIS — I639 Cerebral infarction, unspecified: Secondary | ICD-10-CM | POA: Diagnosis not present

## 2017-08-25 DIAGNOSIS — E211 Secondary hyperparathyroidism, not elsewhere classified: Secondary | ICD-10-CM | POA: Diagnosis present

## 2017-08-25 DIAGNOSIS — R270 Ataxia, unspecified: Secondary | ICD-10-CM | POA: Diagnosis not present

## 2017-08-25 DIAGNOSIS — F329 Major depressive disorder, single episode, unspecified: Secondary | ICD-10-CM | POA: Diagnosis present

## 2017-08-25 DIAGNOSIS — I350 Nonrheumatic aortic (valve) stenosis: Secondary | ICD-10-CM | POA: Diagnosis not present

## 2017-08-25 DIAGNOSIS — R439 Unspecified disturbances of smell and taste: Secondary | ICD-10-CM | POA: Diagnosis not present

## 2017-08-25 DIAGNOSIS — E785 Hyperlipidemia, unspecified: Secondary | ICD-10-CM | POA: Diagnosis not present

## 2017-08-25 DIAGNOSIS — G2581 Restless legs syndrome: Secondary | ICD-10-CM | POA: Diagnosis present

## 2017-08-25 DIAGNOSIS — R202 Paresthesia of skin: Secondary | ICD-10-CM | POA: Diagnosis not present

## 2017-08-25 DIAGNOSIS — M549 Dorsalgia, unspecified: Secondary | ICD-10-CM | POA: Diagnosis not present

## 2017-08-25 DIAGNOSIS — D631 Anemia in chronic kidney disease: Secondary | ICD-10-CM | POA: Diagnosis not present

## 2017-08-25 DIAGNOSIS — M25551 Pain in right hip: Secondary | ICD-10-CM | POA: Diagnosis not present

## 2017-08-25 DIAGNOSIS — E875 Hyperkalemia: Secondary | ICD-10-CM | POA: Diagnosis not present

## 2017-08-25 DIAGNOSIS — R4781 Slurred speech: Secondary | ICD-10-CM | POA: Diagnosis not present

## 2017-08-25 DIAGNOSIS — G8194 Hemiplegia, unspecified affecting left nondominant side: Secondary | ICD-10-CM | POA: Diagnosis not present

## 2017-08-25 DIAGNOSIS — S7001XA Contusion of right hip, initial encounter: Secondary | ICD-10-CM | POA: Diagnosis present

## 2017-08-29 DIAGNOSIS — N2581 Secondary hyperparathyroidism of renal origin: Secondary | ICD-10-CM | POA: Diagnosis not present

## 2017-08-29 DIAGNOSIS — D631 Anemia in chronic kidney disease: Secondary | ICD-10-CM | POA: Diagnosis not present

## 2017-08-29 DIAGNOSIS — N186 End stage renal disease: Secondary | ICD-10-CM | POA: Diagnosis not present

## 2017-08-31 DIAGNOSIS — D631 Anemia in chronic kidney disease: Secondary | ICD-10-CM | POA: Diagnosis not present

## 2017-08-31 DIAGNOSIS — N2581 Secondary hyperparathyroidism of renal origin: Secondary | ICD-10-CM | POA: Diagnosis not present

## 2017-08-31 DIAGNOSIS — N186 End stage renal disease: Secondary | ICD-10-CM | POA: Diagnosis not present

## 2017-09-02 DIAGNOSIS — N2581 Secondary hyperparathyroidism of renal origin: Secondary | ICD-10-CM | POA: Diagnosis not present

## 2017-09-02 DIAGNOSIS — D631 Anemia in chronic kidney disease: Secondary | ICD-10-CM | POA: Diagnosis not present

## 2017-09-02 DIAGNOSIS — Z992 Dependence on renal dialysis: Secondary | ICD-10-CM | POA: Diagnosis not present

## 2017-09-02 DIAGNOSIS — N186 End stage renal disease: Secondary | ICD-10-CM | POA: Diagnosis not present

## 2017-09-06 DIAGNOSIS — N2581 Secondary hyperparathyroidism of renal origin: Secondary | ICD-10-CM | POA: Diagnosis not present

## 2017-09-06 DIAGNOSIS — N186 End stage renal disease: Secondary | ICD-10-CM | POA: Diagnosis not present

## 2017-09-06 DIAGNOSIS — D631 Anemia in chronic kidney disease: Secondary | ICD-10-CM | POA: Diagnosis not present

## 2017-09-07 DIAGNOSIS — N186 End stage renal disease: Secondary | ICD-10-CM | POA: Diagnosis not present

## 2017-09-07 DIAGNOSIS — D631 Anemia in chronic kidney disease: Secondary | ICD-10-CM | POA: Diagnosis not present

## 2017-09-07 DIAGNOSIS — N2581 Secondary hyperparathyroidism of renal origin: Secondary | ICD-10-CM | POA: Diagnosis not present

## 2017-09-09 DIAGNOSIS — D631 Anemia in chronic kidney disease: Secondary | ICD-10-CM | POA: Diagnosis not present

## 2017-09-09 DIAGNOSIS — N2581 Secondary hyperparathyroidism of renal origin: Secondary | ICD-10-CM | POA: Diagnosis not present

## 2017-09-09 DIAGNOSIS — N186 End stage renal disease: Secondary | ICD-10-CM | POA: Diagnosis not present

## 2017-09-13 DIAGNOSIS — N2581 Secondary hyperparathyroidism of renal origin: Secondary | ICD-10-CM | POA: Diagnosis not present

## 2017-09-13 DIAGNOSIS — N186 End stage renal disease: Secondary | ICD-10-CM | POA: Diagnosis not present

## 2017-09-13 DIAGNOSIS — D631 Anemia in chronic kidney disease: Secondary | ICD-10-CM | POA: Diagnosis not present

## 2017-09-14 DIAGNOSIS — N186 End stage renal disease: Secondary | ICD-10-CM | POA: Diagnosis not present

## 2017-09-14 DIAGNOSIS — N2581 Secondary hyperparathyroidism of renal origin: Secondary | ICD-10-CM | POA: Diagnosis not present

## 2017-09-14 DIAGNOSIS — D631 Anemia in chronic kidney disease: Secondary | ICD-10-CM | POA: Diagnosis not present

## 2017-09-16 DIAGNOSIS — N2581 Secondary hyperparathyroidism of renal origin: Secondary | ICD-10-CM | POA: Diagnosis not present

## 2017-09-16 DIAGNOSIS — D631 Anemia in chronic kidney disease: Secondary | ICD-10-CM | POA: Diagnosis not present

## 2017-09-16 DIAGNOSIS — N186 End stage renal disease: Secondary | ICD-10-CM | POA: Diagnosis not present

## 2017-09-19 DIAGNOSIS — N2581 Secondary hyperparathyroidism of renal origin: Secondary | ICD-10-CM | POA: Diagnosis not present

## 2017-09-19 DIAGNOSIS — N186 End stage renal disease: Secondary | ICD-10-CM | POA: Diagnosis not present

## 2017-09-19 DIAGNOSIS — D631 Anemia in chronic kidney disease: Secondary | ICD-10-CM | POA: Diagnosis not present

## 2017-09-21 DIAGNOSIS — N186 End stage renal disease: Secondary | ICD-10-CM | POA: Diagnosis not present

## 2017-09-21 DIAGNOSIS — D631 Anemia in chronic kidney disease: Secondary | ICD-10-CM | POA: Diagnosis not present

## 2017-09-21 DIAGNOSIS — N2581 Secondary hyperparathyroidism of renal origin: Secondary | ICD-10-CM | POA: Diagnosis not present

## 2017-09-23 DIAGNOSIS — N2581 Secondary hyperparathyroidism of renal origin: Secondary | ICD-10-CM | POA: Diagnosis not present

## 2017-09-23 DIAGNOSIS — N186 End stage renal disease: Secondary | ICD-10-CM | POA: Diagnosis not present

## 2017-09-23 DIAGNOSIS — D631 Anemia in chronic kidney disease: Secondary | ICD-10-CM | POA: Diagnosis not present

## 2017-09-25 DIAGNOSIS — D631 Anemia in chronic kidney disease: Secondary | ICD-10-CM | POA: Diagnosis not present

## 2017-09-25 DIAGNOSIS — N2581 Secondary hyperparathyroidism of renal origin: Secondary | ICD-10-CM | POA: Diagnosis not present

## 2017-09-25 DIAGNOSIS — N186 End stage renal disease: Secondary | ICD-10-CM | POA: Diagnosis not present

## 2017-09-28 DIAGNOSIS — N186 End stage renal disease: Secondary | ICD-10-CM | POA: Diagnosis not present

## 2017-09-28 DIAGNOSIS — D631 Anemia in chronic kidney disease: Secondary | ICD-10-CM | POA: Diagnosis not present

## 2017-09-28 DIAGNOSIS — N2581 Secondary hyperparathyroidism of renal origin: Secondary | ICD-10-CM | POA: Diagnosis not present

## 2017-09-30 DIAGNOSIS — N186 End stage renal disease: Secondary | ICD-10-CM | POA: Diagnosis not present

## 2017-09-30 DIAGNOSIS — N2581 Secondary hyperparathyroidism of renal origin: Secondary | ICD-10-CM | POA: Diagnosis not present

## 2017-09-30 DIAGNOSIS — D631 Anemia in chronic kidney disease: Secondary | ICD-10-CM | POA: Diagnosis not present

## 2017-10-03 DIAGNOSIS — N2581 Secondary hyperparathyroidism of renal origin: Secondary | ICD-10-CM | POA: Diagnosis not present

## 2017-10-03 DIAGNOSIS — N186 End stage renal disease: Secondary | ICD-10-CM | POA: Diagnosis not present

## 2017-10-03 DIAGNOSIS — D631 Anemia in chronic kidney disease: Secondary | ICD-10-CM | POA: Diagnosis not present

## 2017-10-03 DIAGNOSIS — Z992 Dependence on renal dialysis: Secondary | ICD-10-CM | POA: Diagnosis not present

## 2017-10-04 DIAGNOSIS — Z992 Dependence on renal dialysis: Secondary | ICD-10-CM | POA: Diagnosis not present

## 2017-10-04 DIAGNOSIS — N186 End stage renal disease: Secondary | ICD-10-CM | POA: Diagnosis not present

## 2017-10-05 DIAGNOSIS — N186 End stage renal disease: Secondary | ICD-10-CM | POA: Diagnosis not present

## 2017-10-05 DIAGNOSIS — N2581 Secondary hyperparathyroidism of renal origin: Secondary | ICD-10-CM | POA: Diagnosis not present

## 2017-10-05 DIAGNOSIS — I10 Essential (primary) hypertension: Secondary | ICD-10-CM | POA: Diagnosis not present

## 2017-10-05 DIAGNOSIS — E039 Hypothyroidism, unspecified: Secondary | ICD-10-CM | POA: Diagnosis not present

## 2017-10-05 DIAGNOSIS — D631 Anemia in chronic kidney disease: Secondary | ICD-10-CM | POA: Diagnosis not present

## 2017-10-05 DIAGNOSIS — Z992 Dependence on renal dialysis: Secondary | ICD-10-CM | POA: Diagnosis not present

## 2017-10-06 DIAGNOSIS — Z992 Dependence on renal dialysis: Secondary | ICD-10-CM | POA: Diagnosis not present

## 2017-10-06 DIAGNOSIS — N186 End stage renal disease: Secondary | ICD-10-CM | POA: Diagnosis not present

## 2017-10-07 DIAGNOSIS — D631 Anemia in chronic kidney disease: Secondary | ICD-10-CM | POA: Diagnosis not present

## 2017-10-07 DIAGNOSIS — Z992 Dependence on renal dialysis: Secondary | ICD-10-CM | POA: Diagnosis not present

## 2017-10-07 DIAGNOSIS — N2581 Secondary hyperparathyroidism of renal origin: Secondary | ICD-10-CM | POA: Diagnosis not present

## 2017-10-07 DIAGNOSIS — N186 End stage renal disease: Secondary | ICD-10-CM | POA: Diagnosis not present

## 2017-10-08 DIAGNOSIS — N186 End stage renal disease: Secondary | ICD-10-CM | POA: Diagnosis not present

## 2017-10-08 DIAGNOSIS — Z992 Dependence on renal dialysis: Secondary | ICD-10-CM | POA: Diagnosis not present

## 2017-10-09 DIAGNOSIS — Z992 Dependence on renal dialysis: Secondary | ICD-10-CM | POA: Diagnosis not present

## 2017-10-09 DIAGNOSIS — N186 End stage renal disease: Secondary | ICD-10-CM | POA: Diagnosis not present

## 2017-10-10 DIAGNOSIS — F419 Anxiety disorder, unspecified: Secondary | ICD-10-CM | POA: Diagnosis not present

## 2017-10-10 DIAGNOSIS — M545 Low back pain: Secondary | ICD-10-CM | POA: Diagnosis not present

## 2017-10-10 DIAGNOSIS — G4700 Insomnia, unspecified: Secondary | ICD-10-CM | POA: Diagnosis not present

## 2017-10-10 DIAGNOSIS — N186 End stage renal disease: Secondary | ICD-10-CM | POA: Diagnosis not present

## 2017-10-10 DIAGNOSIS — Z992 Dependence on renal dialysis: Secondary | ICD-10-CM | POA: Diagnosis not present

## 2017-10-10 DIAGNOSIS — N2581 Secondary hyperparathyroidism of renal origin: Secondary | ICD-10-CM | POA: Diagnosis not present

## 2017-10-10 DIAGNOSIS — N529 Male erectile dysfunction, unspecified: Secondary | ICD-10-CM | POA: Diagnosis not present

## 2017-10-10 DIAGNOSIS — R05 Cough: Secondary | ICD-10-CM | POA: Diagnosis not present

## 2017-10-10 DIAGNOSIS — D631 Anemia in chronic kidney disease: Secondary | ICD-10-CM | POA: Diagnosis not present

## 2017-10-11 DIAGNOSIS — Z992 Dependence on renal dialysis: Secondary | ICD-10-CM | POA: Diagnosis not present

## 2017-10-11 DIAGNOSIS — N186 End stage renal disease: Secondary | ICD-10-CM | POA: Diagnosis not present

## 2017-10-12 DIAGNOSIS — Z7951 Long term (current) use of inhaled steroids: Secondary | ICD-10-CM | POA: Diagnosis not present

## 2017-10-12 DIAGNOSIS — I1311 Hypertensive heart and chronic kidney disease without heart failure, with stage 5 chronic kidney disease, or end stage renal disease: Secondary | ICD-10-CM | POA: Diagnosis present

## 2017-10-12 DIAGNOSIS — I10 Essential (primary) hypertension: Secondary | ICD-10-CM | POA: Diagnosis not present

## 2017-10-12 DIAGNOSIS — D62 Acute posthemorrhagic anemia: Secondary | ICD-10-CM | POA: Diagnosis not present

## 2017-10-12 DIAGNOSIS — Z87442 Personal history of urinary calculi: Secondary | ICD-10-CM | POA: Diagnosis not present

## 2017-10-12 DIAGNOSIS — D631 Anemia in chronic kidney disease: Secondary | ICD-10-CM | POA: Diagnosis not present

## 2017-10-12 DIAGNOSIS — E875 Hyperkalemia: Secondary | ICD-10-CM | POA: Diagnosis not present

## 2017-10-12 DIAGNOSIS — D696 Thrombocytopenia, unspecified: Secondary | ICD-10-CM | POA: Diagnosis not present

## 2017-10-12 DIAGNOSIS — Z8701 Personal history of pneumonia (recurrent): Secondary | ICD-10-CM | POA: Diagnosis not present

## 2017-10-12 DIAGNOSIS — D899 Disorder involving the immune mechanism, unspecified: Secondary | ICD-10-CM | POA: Diagnosis not present

## 2017-10-12 DIAGNOSIS — Z8673 Personal history of transient ischemic attack (TIA), and cerebral infarction without residual deficits: Secondary | ICD-10-CM | POA: Diagnosis not present

## 2017-10-12 DIAGNOSIS — R9431 Abnormal electrocardiogram [ECG] [EKG]: Secondary | ICD-10-CM | POA: Diagnosis not present

## 2017-10-12 DIAGNOSIS — Z940 Kidney transplant status: Secondary | ICD-10-CM | POA: Diagnosis not present

## 2017-10-12 DIAGNOSIS — Z9181 History of falling: Secondary | ICD-10-CM | POA: Diagnosis not present

## 2017-10-12 DIAGNOSIS — J9811 Atelectasis: Secondary | ICD-10-CM | POA: Diagnosis not present

## 2017-10-12 DIAGNOSIS — E871 Hypo-osmolality and hyponatremia: Secondary | ICD-10-CM | POA: Diagnosis not present

## 2017-10-12 DIAGNOSIS — Z79899 Other long term (current) drug therapy: Secondary | ICD-10-CM | POA: Diagnosis not present

## 2017-10-12 DIAGNOSIS — F329 Major depressive disorder, single episode, unspecified: Secondary | ICD-10-CM | POA: Diagnosis present

## 2017-10-12 DIAGNOSIS — J810 Acute pulmonary edema: Secondary | ICD-10-CM | POA: Diagnosis not present

## 2017-10-12 DIAGNOSIS — N2581 Secondary hyperparathyroidism of renal origin: Secondary | ICD-10-CM | POA: Diagnosis not present

## 2017-10-12 DIAGNOSIS — Z4822 Encounter for aftercare following kidney transplant: Secondary | ICD-10-CM | POA: Diagnosis not present

## 2017-10-12 DIAGNOSIS — N186 End stage renal disease: Secondary | ICD-10-CM | POA: Diagnosis not present

## 2017-10-12 DIAGNOSIS — Z992 Dependence on renal dialysis: Secondary | ICD-10-CM | POA: Diagnosis not present

## 2017-10-12 DIAGNOSIS — Z01818 Encounter for other preprocedural examination: Secondary | ICD-10-CM | POA: Diagnosis not present

## 2017-10-12 DIAGNOSIS — I517 Cardiomegaly: Secondary | ICD-10-CM | POA: Diagnosis not present

## 2017-10-12 DIAGNOSIS — I120 Hypertensive chronic kidney disease with stage 5 chronic kidney disease or end stage renal disease: Secondary | ICD-10-CM | POA: Diagnosis not present

## 2017-10-12 DIAGNOSIS — Z79891 Long term (current) use of opiate analgesic: Secondary | ICD-10-CM | POA: Diagnosis not present

## 2017-10-12 DIAGNOSIS — Z888 Allergy status to other drugs, medicaments and biological substances status: Secondary | ICD-10-CM | POA: Diagnosis not present

## 2017-10-12 DIAGNOSIS — G8929 Other chronic pain: Secondary | ICD-10-CM | POA: Diagnosis present

## 2017-10-12 DIAGNOSIS — G4700 Insomnia, unspecified: Secondary | ICD-10-CM | POA: Diagnosis not present

## 2017-10-12 DIAGNOSIS — G2581 Restless legs syndrome: Secondary | ICD-10-CM | POA: Diagnosis present

## 2017-10-12 DIAGNOSIS — Z7982 Long term (current) use of aspirin: Secondary | ICD-10-CM | POA: Diagnosis not present

## 2017-10-12 DIAGNOSIS — F419 Anxiety disorder, unspecified: Secondary | ICD-10-CM | POA: Diagnosis not present

## 2017-10-12 DIAGNOSIS — K5900 Constipation, unspecified: Secondary | ICD-10-CM | POA: Diagnosis not present

## 2017-10-12 DIAGNOSIS — Z5181 Encounter for therapeutic drug level monitoring: Secondary | ICD-10-CM | POA: Diagnosis not present

## 2017-10-12 DIAGNOSIS — E872 Acidosis: Secondary | ICD-10-CM | POA: Diagnosis not present

## 2017-10-21 DIAGNOSIS — I10 Essential (primary) hypertension: Secondary | ICD-10-CM | POA: Diagnosis not present

## 2017-10-21 DIAGNOSIS — Z792 Long term (current) use of antibiotics: Secondary | ICD-10-CM | POA: Diagnosis not present

## 2017-10-21 DIAGNOSIS — Z9889 Other specified postprocedural states: Secondary | ICD-10-CM | POA: Diagnosis not present

## 2017-10-21 DIAGNOSIS — D72829 Elevated white blood cell count, unspecified: Secondary | ICD-10-CM | POA: Diagnosis not present

## 2017-10-21 DIAGNOSIS — D8989 Other specified disorders involving the immune mechanism, not elsewhere classified: Secondary | ICD-10-CM | POA: Diagnosis not present

## 2017-10-21 DIAGNOSIS — D649 Anemia, unspecified: Secondary | ICD-10-CM | POA: Diagnosis not present

## 2017-10-21 DIAGNOSIS — D696 Thrombocytopenia, unspecified: Secondary | ICD-10-CM | POA: Diagnosis not present

## 2017-10-21 DIAGNOSIS — Z940 Kidney transplant status: Secondary | ICD-10-CM | POA: Diagnosis not present

## 2017-10-21 DIAGNOSIS — Z4822 Encounter for aftercare following kidney transplant: Secondary | ICD-10-CM | POA: Diagnosis not present

## 2017-10-21 DIAGNOSIS — E875 Hyperkalemia: Secondary | ICD-10-CM | POA: Diagnosis not present

## 2017-10-21 DIAGNOSIS — Z4803 Encounter for change or removal of drains: Secondary | ICD-10-CM | POA: Diagnosis not present

## 2017-10-21 DIAGNOSIS — E213 Hyperparathyroidism, unspecified: Secondary | ICD-10-CM | POA: Diagnosis not present

## 2017-10-21 DIAGNOSIS — Z7952 Long term (current) use of systemic steroids: Secondary | ICD-10-CM | POA: Diagnosis not present

## 2017-10-21 DIAGNOSIS — N2581 Secondary hyperparathyroidism of renal origin: Secondary | ICD-10-CM | POA: Diagnosis not present

## 2017-10-21 DIAGNOSIS — Z79899 Other long term (current) drug therapy: Secondary | ICD-10-CM | POA: Diagnosis not present

## 2017-10-25 DIAGNOSIS — Z940 Kidney transplant status: Secondary | ICD-10-CM | POA: Diagnosis not present

## 2017-10-25 DIAGNOSIS — Z4822 Encounter for aftercare following kidney transplant: Secondary | ICD-10-CM | POA: Diagnosis not present

## 2017-10-25 DIAGNOSIS — Z792 Long term (current) use of antibiotics: Secondary | ICD-10-CM | POA: Diagnosis not present

## 2017-10-25 DIAGNOSIS — N2581 Secondary hyperparathyroidism of renal origin: Secondary | ICD-10-CM | POA: Diagnosis not present

## 2017-10-25 DIAGNOSIS — D649 Anemia, unspecified: Secondary | ICD-10-CM | POA: Diagnosis not present

## 2017-10-25 DIAGNOSIS — D46A Refractory cytopenia with multilineage dysplasia: Secondary | ICD-10-CM | POA: Diagnosis not present

## 2017-10-25 DIAGNOSIS — D696 Thrombocytopenia, unspecified: Secondary | ICD-10-CM | POA: Diagnosis not present

## 2017-10-25 DIAGNOSIS — E875 Hyperkalemia: Secondary | ICD-10-CM | POA: Diagnosis not present

## 2017-10-25 DIAGNOSIS — I10 Essential (primary) hypertension: Secondary | ICD-10-CM | POA: Diagnosis not present

## 2017-10-25 DIAGNOSIS — Z7952 Long term (current) use of systemic steroids: Secondary | ICD-10-CM | POA: Diagnosis not present

## 2017-10-25 DIAGNOSIS — D8989 Other specified disorders involving the immune mechanism, not elsewhere classified: Secondary | ICD-10-CM | POA: Diagnosis not present

## 2017-10-25 DIAGNOSIS — D72829 Elevated white blood cell count, unspecified: Secondary | ICD-10-CM | POA: Diagnosis not present

## 2017-10-25 DIAGNOSIS — Z79899 Other long term (current) drug therapy: Secondary | ICD-10-CM | POA: Diagnosis not present

## 2017-10-25 DIAGNOSIS — Z955 Presence of coronary angioplasty implant and graft: Secondary | ICD-10-CM | POA: Diagnosis not present

## 2017-10-28 DIAGNOSIS — E2740 Unspecified adrenocortical insufficiency: Secondary | ICD-10-CM | POA: Diagnosis not present

## 2017-10-28 DIAGNOSIS — D649 Anemia, unspecified: Secondary | ICD-10-CM | POA: Diagnosis not present

## 2017-10-28 DIAGNOSIS — Z9889 Other specified postprocedural states: Secondary | ICD-10-CM | POA: Diagnosis not present

## 2017-10-28 DIAGNOSIS — Z9089 Acquired absence of other organs: Secondary | ICD-10-CM | POA: Diagnosis not present

## 2017-10-28 DIAGNOSIS — D46A Refractory cytopenia with multilineage dysplasia: Secondary | ICD-10-CM | POA: Diagnosis not present

## 2017-10-28 DIAGNOSIS — I120 Hypertensive chronic kidney disease with stage 5 chronic kidney disease or end stage renal disease: Secondary | ICD-10-CM | POA: Diagnosis not present

## 2017-10-28 DIAGNOSIS — D72829 Elevated white blood cell count, unspecified: Secondary | ICD-10-CM | POA: Diagnosis not present

## 2017-10-28 DIAGNOSIS — N2581 Secondary hyperparathyroidism of renal origin: Secondary | ICD-10-CM | POA: Diagnosis not present

## 2017-10-28 DIAGNOSIS — N186 End stage renal disease: Secondary | ICD-10-CM | POA: Diagnosis not present

## 2017-10-28 DIAGNOSIS — E875 Hyperkalemia: Secondary | ICD-10-CM | POA: Diagnosis not present

## 2017-10-28 DIAGNOSIS — Z792 Long term (current) use of antibiotics: Secondary | ICD-10-CM | POA: Diagnosis not present

## 2017-10-28 DIAGNOSIS — R7989 Other specified abnormal findings of blood chemistry: Secondary | ICD-10-CM | POA: Diagnosis not present

## 2017-10-28 DIAGNOSIS — Z7952 Long term (current) use of systemic steroids: Secondary | ICD-10-CM | POA: Diagnosis not present

## 2017-10-28 DIAGNOSIS — Z940 Kidney transplant status: Secondary | ICD-10-CM | POA: Diagnosis not present

## 2017-10-28 DIAGNOSIS — Z4822 Encounter for aftercare following kidney transplant: Secondary | ICD-10-CM | POA: Diagnosis not present

## 2017-10-28 DIAGNOSIS — I10 Essential (primary) hypertension: Secondary | ICD-10-CM | POA: Diagnosis not present

## 2017-10-28 DIAGNOSIS — Z79899 Other long term (current) drug therapy: Secondary | ICD-10-CM | POA: Diagnosis not present

## 2017-10-28 DIAGNOSIS — D696 Thrombocytopenia, unspecified: Secondary | ICD-10-CM | POA: Diagnosis not present

## 2017-11-01 DIAGNOSIS — E875 Hyperkalemia: Secondary | ICD-10-CM | POA: Diagnosis not present

## 2017-11-01 DIAGNOSIS — D899 Disorder involving the immune mechanism, unspecified: Secondary | ICD-10-CM | POA: Diagnosis not present

## 2017-11-01 DIAGNOSIS — Z9089 Acquired absence of other organs: Secondary | ICD-10-CM | POA: Diagnosis not present

## 2017-11-01 DIAGNOSIS — T465X5A Adverse effect of other antihypertensive drugs, initial encounter: Secondary | ICD-10-CM | POA: Diagnosis not present

## 2017-11-01 DIAGNOSIS — R7989 Other specified abnormal findings of blood chemistry: Secondary | ICD-10-CM | POA: Diagnosis not present

## 2017-11-01 DIAGNOSIS — Z792 Long term (current) use of antibiotics: Secondary | ICD-10-CM | POA: Diagnosis not present

## 2017-11-01 DIAGNOSIS — I10 Essential (primary) hypertension: Secondary | ICD-10-CM | POA: Diagnosis not present

## 2017-11-01 DIAGNOSIS — K5909 Other constipation: Secondary | ICD-10-CM | POA: Diagnosis not present

## 2017-11-01 DIAGNOSIS — D759 Disease of blood and blood-forming organs, unspecified: Secondary | ICD-10-CM | POA: Diagnosis not present

## 2017-11-01 DIAGNOSIS — N2581 Secondary hyperparathyroidism of renal origin: Secondary | ICD-10-CM | POA: Diagnosis not present

## 2017-11-01 DIAGNOSIS — Z4822 Encounter for aftercare following kidney transplant: Secondary | ICD-10-CM | POA: Diagnosis not present

## 2017-11-01 DIAGNOSIS — Z7982 Long term (current) use of aspirin: Secondary | ICD-10-CM | POA: Diagnosis not present

## 2017-11-01 DIAGNOSIS — D696 Thrombocytopenia, unspecified: Secondary | ICD-10-CM | POA: Diagnosis not present

## 2017-11-01 DIAGNOSIS — Z7952 Long term (current) use of systemic steroids: Secondary | ICD-10-CM | POA: Diagnosis not present

## 2017-11-01 DIAGNOSIS — I952 Hypotension due to drugs: Secondary | ICD-10-CM | POA: Diagnosis not present

## 2017-11-01 DIAGNOSIS — Z4802 Encounter for removal of sutures: Secondary | ICD-10-CM | POA: Diagnosis not present

## 2017-11-01 DIAGNOSIS — Z79899 Other long term (current) drug therapy: Secondary | ICD-10-CM | POA: Diagnosis not present

## 2017-11-01 DIAGNOSIS — D649 Anemia, unspecified: Secondary | ICD-10-CM | POA: Diagnosis not present

## 2017-11-01 DIAGNOSIS — Q999 Chromosomal abnormality, unspecified: Secondary | ICD-10-CM | POA: Diagnosis not present

## 2017-11-01 DIAGNOSIS — Z940 Kidney transplant status: Secondary | ICD-10-CM | POA: Diagnosis not present

## 2017-11-04 DIAGNOSIS — Z940 Kidney transplant status: Secondary | ICD-10-CM | POA: Diagnosis not present

## 2017-11-04 DIAGNOSIS — D759 Disease of blood and blood-forming organs, unspecified: Secondary | ICD-10-CM | POA: Diagnosis not present

## 2017-11-04 DIAGNOSIS — R7989 Other specified abnormal findings of blood chemistry: Secondary | ICD-10-CM | POA: Diagnosis not present

## 2017-11-04 DIAGNOSIS — Z792 Long term (current) use of antibiotics: Secondary | ICD-10-CM | POA: Diagnosis not present

## 2017-11-04 DIAGNOSIS — Z9089 Acquired absence of other organs: Secondary | ICD-10-CM | POA: Diagnosis not present

## 2017-11-04 DIAGNOSIS — I10 Essential (primary) hypertension: Secondary | ICD-10-CM | POA: Diagnosis not present

## 2017-11-04 DIAGNOSIS — Z79899 Other long term (current) drug therapy: Secondary | ICD-10-CM | POA: Diagnosis not present

## 2017-11-04 DIAGNOSIS — Z9889 Other specified postprocedural states: Secondary | ICD-10-CM | POA: Diagnosis not present

## 2017-11-04 DIAGNOSIS — N2581 Secondary hyperparathyroidism of renal origin: Secondary | ICD-10-CM | POA: Diagnosis not present

## 2017-11-04 DIAGNOSIS — D649 Anemia, unspecified: Secondary | ICD-10-CM | POA: Diagnosis not present

## 2017-11-04 DIAGNOSIS — D899 Disorder involving the immune mechanism, unspecified: Secondary | ICD-10-CM | POA: Diagnosis not present

## 2017-11-04 DIAGNOSIS — D696 Thrombocytopenia, unspecified: Secondary | ICD-10-CM | POA: Diagnosis not present

## 2017-11-08 DIAGNOSIS — E875 Hyperkalemia: Secondary | ICD-10-CM | POA: Diagnosis not present

## 2017-11-08 DIAGNOSIS — D899 Disorder involving the immune mechanism, unspecified: Secondary | ICD-10-CM | POA: Diagnosis not present

## 2017-11-08 DIAGNOSIS — Z792 Long term (current) use of antibiotics: Secondary | ICD-10-CM | POA: Diagnosis not present

## 2017-11-08 DIAGNOSIS — Z4822 Encounter for aftercare following kidney transplant: Secondary | ICD-10-CM | POA: Diagnosis not present

## 2017-11-08 DIAGNOSIS — I10 Essential (primary) hypertension: Secondary | ICD-10-CM | POA: Diagnosis not present

## 2017-11-08 DIAGNOSIS — D696 Thrombocytopenia, unspecified: Secondary | ICD-10-CM | POA: Diagnosis not present

## 2017-11-08 DIAGNOSIS — Z7952 Long term (current) use of systemic steroids: Secondary | ICD-10-CM | POA: Diagnosis not present

## 2017-11-08 DIAGNOSIS — E2740 Unspecified adrenocortical insufficiency: Secondary | ICD-10-CM | POA: Diagnosis not present

## 2017-11-08 DIAGNOSIS — D649 Anemia, unspecified: Secondary | ICD-10-CM | POA: Diagnosis not present

## 2017-11-08 DIAGNOSIS — R7989 Other specified abnormal findings of blood chemistry: Secondary | ICD-10-CM | POA: Diagnosis not present

## 2017-11-08 DIAGNOSIS — N2581 Secondary hyperparathyroidism of renal origin: Secondary | ICD-10-CM | POA: Diagnosis not present

## 2017-11-08 DIAGNOSIS — I951 Orthostatic hypotension: Secondary | ICD-10-CM | POA: Diagnosis not present

## 2017-11-08 DIAGNOSIS — Z79899 Other long term (current) drug therapy: Secondary | ICD-10-CM | POA: Diagnosis not present

## 2017-11-08 DIAGNOSIS — Z940 Kidney transplant status: Secondary | ICD-10-CM | POA: Diagnosis not present

## 2017-11-08 DIAGNOSIS — T85848A Pain due to other internal prosthetic devices, implants and grafts, initial encounter: Secondary | ICD-10-CM | POA: Diagnosis not present

## 2017-11-11 DIAGNOSIS — Z79899 Other long term (current) drug therapy: Secondary | ICD-10-CM | POA: Diagnosis not present

## 2017-11-11 DIAGNOSIS — N2581 Secondary hyperparathyroidism of renal origin: Secondary | ICD-10-CM | POA: Diagnosis not present

## 2017-11-11 DIAGNOSIS — Z940 Kidney transplant status: Secondary | ICD-10-CM | POA: Diagnosis not present

## 2017-11-11 DIAGNOSIS — E875 Hyperkalemia: Secondary | ICD-10-CM | POA: Diagnosis not present

## 2017-11-11 DIAGNOSIS — Z4822 Encounter for aftercare following kidney transplant: Secondary | ICD-10-CM | POA: Diagnosis not present

## 2017-11-11 DIAGNOSIS — R7989 Other specified abnormal findings of blood chemistry: Secondary | ICD-10-CM | POA: Diagnosis not present

## 2017-11-11 DIAGNOSIS — I10 Essential (primary) hypertension: Secondary | ICD-10-CM | POA: Diagnosis not present

## 2017-11-11 DIAGNOSIS — D649 Anemia, unspecified: Secondary | ICD-10-CM | POA: Diagnosis not present

## 2017-11-11 DIAGNOSIS — Z7952 Long term (current) use of systemic steroids: Secondary | ICD-10-CM | POA: Diagnosis not present

## 2017-11-11 DIAGNOSIS — K5909 Other constipation: Secondary | ICD-10-CM | POA: Diagnosis not present

## 2017-11-11 DIAGNOSIS — E2740 Unspecified adrenocortical insufficiency: Secondary | ICD-10-CM | POA: Diagnosis not present

## 2017-11-11 DIAGNOSIS — E8989 Other postprocedural endocrine and metabolic complications and disorders: Secondary | ICD-10-CM | POA: Diagnosis not present

## 2017-11-11 DIAGNOSIS — Z792 Long term (current) use of antibiotics: Secondary | ICD-10-CM | POA: Diagnosis not present

## 2017-11-11 DIAGNOSIS — D696 Thrombocytopenia, unspecified: Secondary | ICD-10-CM | POA: Diagnosis not present

## 2017-11-15 DIAGNOSIS — D649 Anemia, unspecified: Secondary | ICD-10-CM | POA: Diagnosis not present

## 2017-11-15 DIAGNOSIS — R7989 Other specified abnormal findings of blood chemistry: Secondary | ICD-10-CM | POA: Diagnosis not present

## 2017-11-15 DIAGNOSIS — Z940 Kidney transplant status: Secondary | ICD-10-CM | POA: Diagnosis not present

## 2017-11-15 DIAGNOSIS — E875 Hyperkalemia: Secondary | ICD-10-CM | POA: Diagnosis not present

## 2017-11-15 DIAGNOSIS — D899 Disorder involving the immune mechanism, unspecified: Secondary | ICD-10-CM | POA: Diagnosis not present

## 2017-11-15 DIAGNOSIS — T83192A Other mechanical complication of urinary stent, initial encounter: Secondary | ICD-10-CM | POA: Diagnosis not present

## 2017-11-15 DIAGNOSIS — Z4822 Encounter for aftercare following kidney transplant: Secondary | ICD-10-CM | POA: Diagnosis not present

## 2017-11-15 DIAGNOSIS — I10 Essential (primary) hypertension: Secondary | ICD-10-CM | POA: Diagnosis not present

## 2017-11-15 DIAGNOSIS — R42 Dizziness and giddiness: Secondary | ICD-10-CM | POA: Diagnosis not present

## 2017-11-15 DIAGNOSIS — Z7952 Long term (current) use of systemic steroids: Secondary | ICD-10-CM | POA: Diagnosis not present

## 2017-11-15 DIAGNOSIS — N2581 Secondary hyperparathyroidism of renal origin: Secondary | ICD-10-CM | POA: Diagnosis not present

## 2017-11-15 DIAGNOSIS — Z792 Long term (current) use of antibiotics: Secondary | ICD-10-CM | POA: Diagnosis not present

## 2017-11-15 DIAGNOSIS — Z79899 Other long term (current) drug therapy: Secondary | ICD-10-CM | POA: Diagnosis not present

## 2017-11-15 DIAGNOSIS — D696 Thrombocytopenia, unspecified: Secondary | ICD-10-CM | POA: Diagnosis not present

## 2017-11-15 DIAGNOSIS — Z7982 Long term (current) use of aspirin: Secondary | ICD-10-CM | POA: Diagnosis not present

## 2017-11-15 DIAGNOSIS — Z466 Encounter for fitting and adjustment of urinary device: Secondary | ICD-10-CM | POA: Diagnosis not present

## 2017-11-15 DIAGNOSIS — I951 Orthostatic hypotension: Secondary | ICD-10-CM | POA: Diagnosis not present

## 2017-11-21 DIAGNOSIS — N186 End stage renal disease: Secondary | ICD-10-CM | POA: Diagnosis not present

## 2017-11-21 DIAGNOSIS — T8691 Unspecified transplanted organ and tissue rejection: Secondary | ICD-10-CM | POA: Diagnosis not present

## 2017-11-21 DIAGNOSIS — I158 Other secondary hypertension: Secondary | ICD-10-CM | POA: Diagnosis not present

## 2017-11-21 DIAGNOSIS — Z4822 Encounter for aftercare following kidney transplant: Secondary | ICD-10-CM | POA: Diagnosis not present

## 2017-11-21 DIAGNOSIS — Z01818 Encounter for other preprocedural examination: Secondary | ICD-10-CM | POA: Diagnosis not present

## 2017-11-21 DIAGNOSIS — N2581 Secondary hyperparathyroidism of renal origin: Secondary | ICD-10-CM | POA: Diagnosis not present

## 2017-11-21 DIAGNOSIS — Z940 Kidney transplant status: Secondary | ICD-10-CM | POA: Diagnosis not present

## 2017-11-21 DIAGNOSIS — Z79899 Other long term (current) drug therapy: Secondary | ICD-10-CM | POA: Diagnosis not present

## 2017-11-22 DIAGNOSIS — Z5181 Encounter for therapeutic drug level monitoring: Secondary | ICD-10-CM | POA: Diagnosis not present

## 2017-11-22 DIAGNOSIS — Z940 Kidney transplant status: Secondary | ICD-10-CM | POA: Diagnosis not present

## 2017-11-22 DIAGNOSIS — Z4822 Encounter for aftercare following kidney transplant: Secondary | ICD-10-CM | POA: Diagnosis not present

## 2017-11-22 DIAGNOSIS — I158 Other secondary hypertension: Secondary | ICD-10-CM | POA: Diagnosis not present

## 2017-11-22 DIAGNOSIS — Z01818 Encounter for other preprocedural examination: Secondary | ICD-10-CM | POA: Diagnosis not present

## 2017-11-22 DIAGNOSIS — Z79899 Other long term (current) drug therapy: Secondary | ICD-10-CM | POA: Diagnosis not present

## 2017-11-22 DIAGNOSIS — N186 End stage renal disease: Secondary | ICD-10-CM | POA: Diagnosis not present

## 2017-11-22 DIAGNOSIS — T8691 Unspecified transplanted organ and tissue rejection: Secondary | ICD-10-CM | POA: Diagnosis not present

## 2017-11-22 DIAGNOSIS — N2581 Secondary hyperparathyroidism of renal origin: Secondary | ICD-10-CM | POA: Diagnosis not present

## 2017-11-22 DIAGNOSIS — I10 Essential (primary) hypertension: Secondary | ICD-10-CM | POA: Diagnosis not present

## 2017-11-30 DIAGNOSIS — Z940 Kidney transplant status: Secondary | ICD-10-CM | POA: Diagnosis not present

## 2017-12-06 DIAGNOSIS — B258 Other cytomegaloviral diseases: Secondary | ICD-10-CM | POA: Diagnosis not present

## 2017-12-06 DIAGNOSIS — Z940 Kidney transplant status: Secondary | ICD-10-CM | POA: Diagnosis not present

## 2017-12-06 DIAGNOSIS — D8989 Other specified disorders involving the immune mechanism, not elsewhere classified: Secondary | ICD-10-CM | POA: Diagnosis not present

## 2017-12-06 DIAGNOSIS — D696 Thrombocytopenia, unspecified: Secondary | ICD-10-CM | POA: Diagnosis not present

## 2017-12-06 DIAGNOSIS — R945 Abnormal results of liver function studies: Secondary | ICD-10-CM | POA: Diagnosis not present

## 2017-12-06 DIAGNOSIS — Z4822 Encounter for aftercare following kidney transplant: Secondary | ICD-10-CM | POA: Diagnosis not present

## 2017-12-06 DIAGNOSIS — Z792 Long term (current) use of antibiotics: Secondary | ICD-10-CM | POA: Diagnosis not present

## 2017-12-06 DIAGNOSIS — E875 Hyperkalemia: Secondary | ICD-10-CM | POA: Diagnosis not present

## 2017-12-06 DIAGNOSIS — Z7952 Long term (current) use of systemic steroids: Secondary | ICD-10-CM | POA: Diagnosis not present

## 2017-12-06 DIAGNOSIS — Z79899 Other long term (current) drug therapy: Secondary | ICD-10-CM | POA: Diagnosis not present

## 2017-12-06 DIAGNOSIS — B259 Cytomegaloviral disease, unspecified: Secondary | ICD-10-CM | POA: Diagnosis not present

## 2017-12-06 DIAGNOSIS — D649 Anemia, unspecified: Secondary | ICD-10-CM | POA: Diagnosis not present

## 2017-12-06 DIAGNOSIS — E213 Hyperparathyroidism, unspecified: Secondary | ICD-10-CM | POA: Diagnosis not present

## 2017-12-06 DIAGNOSIS — I10 Essential (primary) hypertension: Secondary | ICD-10-CM | POA: Diagnosis not present

## 2017-12-06 DIAGNOSIS — N2581 Secondary hyperparathyroidism of renal origin: Secondary | ICD-10-CM | POA: Diagnosis not present

## 2017-12-13 DIAGNOSIS — R945 Abnormal results of liver function studies: Secondary | ICD-10-CM | POA: Diagnosis not present

## 2017-12-13 DIAGNOSIS — Z940 Kidney transplant status: Secondary | ICD-10-CM | POA: Diagnosis not present

## 2017-12-13 DIAGNOSIS — Z794 Long term (current) use of insulin: Secondary | ICD-10-CM | POA: Diagnosis not present

## 2017-12-13 DIAGNOSIS — Z792 Long term (current) use of antibiotics: Secondary | ICD-10-CM | POA: Diagnosis not present

## 2017-12-13 DIAGNOSIS — D649 Anemia, unspecified: Secondary | ICD-10-CM | POA: Diagnosis not present

## 2017-12-13 DIAGNOSIS — E215 Disorder of parathyroid gland, unspecified: Secondary | ICD-10-CM | POA: Diagnosis not present

## 2017-12-13 DIAGNOSIS — Z79899 Other long term (current) drug therapy: Secondary | ICD-10-CM | POA: Diagnosis not present

## 2017-12-13 DIAGNOSIS — E875 Hyperkalemia: Secondary | ICD-10-CM | POA: Diagnosis not present

## 2017-12-13 DIAGNOSIS — D696 Thrombocytopenia, unspecified: Secondary | ICD-10-CM | POA: Diagnosis not present

## 2017-12-13 DIAGNOSIS — R739 Hyperglycemia, unspecified: Secondary | ICD-10-CM | POA: Diagnosis not present

## 2017-12-13 DIAGNOSIS — N2581 Secondary hyperparathyroidism of renal origin: Secondary | ICD-10-CM | POA: Diagnosis not present

## 2017-12-13 DIAGNOSIS — I10 Essential (primary) hypertension: Secondary | ICD-10-CM | POA: Diagnosis not present

## 2017-12-13 DIAGNOSIS — B259 Cytomegaloviral disease, unspecified: Secondary | ICD-10-CM | POA: Diagnosis not present

## 2017-12-13 DIAGNOSIS — Z4822 Encounter for aftercare following kidney transplant: Secondary | ICD-10-CM | POA: Diagnosis not present

## 2017-12-13 DIAGNOSIS — Z7952 Long term (current) use of systemic steroids: Secondary | ICD-10-CM | POA: Diagnosis not present

## 2018-01-03 DIAGNOSIS — D8989 Other specified disorders involving the immune mechanism, not elsewhere classified: Secondary | ICD-10-CM | POA: Diagnosis not present

## 2018-01-03 DIAGNOSIS — D649 Anemia, unspecified: Secondary | ICD-10-CM | POA: Diagnosis not present

## 2018-01-03 DIAGNOSIS — R931 Abnormal findings on diagnostic imaging of heart and coronary circulation: Secondary | ICD-10-CM | POA: Diagnosis not present

## 2018-01-03 DIAGNOSIS — B259 Cytomegaloviral disease, unspecified: Secondary | ICD-10-CM | POA: Diagnosis not present

## 2018-01-03 DIAGNOSIS — Z7952 Long term (current) use of systemic steroids: Secondary | ICD-10-CM | POA: Diagnosis not present

## 2018-01-03 DIAGNOSIS — Z792 Long term (current) use of antibiotics: Secondary | ICD-10-CM | POA: Diagnosis not present

## 2018-01-03 DIAGNOSIS — Z7983 Long term (current) use of bisphosphonates: Secondary | ICD-10-CM | POA: Diagnosis not present

## 2018-01-03 DIAGNOSIS — Z940 Kidney transplant status: Secondary | ICD-10-CM | POA: Diagnosis not present

## 2018-01-03 DIAGNOSIS — I10 Essential (primary) hypertension: Secondary | ICD-10-CM | POA: Diagnosis not present

## 2018-01-03 DIAGNOSIS — M25511 Pain in right shoulder: Secondary | ICD-10-CM | POA: Diagnosis not present

## 2018-01-03 DIAGNOSIS — E871 Hypo-osmolality and hyponatremia: Secondary | ICD-10-CM | POA: Diagnosis not present

## 2018-01-03 DIAGNOSIS — Z79899 Other long term (current) drug therapy: Secondary | ICD-10-CM | POA: Diagnosis not present

## 2018-01-03 DIAGNOSIS — D696 Thrombocytopenia, unspecified: Secondary | ICD-10-CM | POA: Diagnosis not present

## 2018-01-04 DIAGNOSIS — D899 Disorder involving the immune mechanism, unspecified: Secondary | ICD-10-CM | POA: Diagnosis not present

## 2018-01-04 DIAGNOSIS — Z5189 Encounter for other specified aftercare: Secondary | ICD-10-CM | POA: Diagnosis not present

## 2018-01-04 DIAGNOSIS — Z940 Kidney transplant status: Secondary | ICD-10-CM | POA: Diagnosis not present

## 2018-01-06 DIAGNOSIS — Z4822 Encounter for aftercare following kidney transplant: Secondary | ICD-10-CM | POA: Diagnosis not present

## 2018-01-06 DIAGNOSIS — Z940 Kidney transplant status: Secondary | ICD-10-CM | POA: Diagnosis not present

## 2018-01-10 DIAGNOSIS — Z4822 Encounter for aftercare following kidney transplant: Secondary | ICD-10-CM | POA: Diagnosis not present

## 2018-01-17 DIAGNOSIS — D229 Melanocytic nevi, unspecified: Secondary | ICD-10-CM | POA: Diagnosis not present

## 2018-01-17 DIAGNOSIS — R739 Hyperglycemia, unspecified: Secondary | ICD-10-CM | POA: Diagnosis not present

## 2018-01-17 DIAGNOSIS — Z940 Kidney transplant status: Secondary | ICD-10-CM | POA: Diagnosis not present

## 2018-01-17 DIAGNOSIS — R7989 Other specified abnormal findings of blood chemistry: Secondary | ICD-10-CM | POA: Diagnosis not present

## 2018-01-17 DIAGNOSIS — D696 Thrombocytopenia, unspecified: Secondary | ICD-10-CM | POA: Diagnosis not present

## 2018-01-17 DIAGNOSIS — Z9889 Other specified postprocedural states: Secondary | ICD-10-CM | POA: Diagnosis not present

## 2018-01-17 DIAGNOSIS — Z792 Long term (current) use of antibiotics: Secondary | ICD-10-CM | POA: Diagnosis not present

## 2018-01-17 DIAGNOSIS — E892 Postprocedural hypoparathyroidism: Secondary | ICD-10-CM | POA: Diagnosis not present

## 2018-01-17 DIAGNOSIS — D485 Neoplasm of uncertain behavior of skin: Secondary | ICD-10-CM | POA: Diagnosis not present

## 2018-01-17 DIAGNOSIS — N2581 Secondary hyperparathyroidism of renal origin: Secondary | ICD-10-CM | POA: Diagnosis not present

## 2018-01-17 DIAGNOSIS — Z9089 Acquired absence of other organs: Secondary | ICD-10-CM | POA: Diagnosis not present

## 2018-01-17 DIAGNOSIS — L918 Other hypertrophic disorders of the skin: Secondary | ICD-10-CM | POA: Diagnosis not present

## 2018-01-17 DIAGNOSIS — L814 Other melanin hyperpigmentation: Secondary | ICD-10-CM | POA: Diagnosis not present

## 2018-01-17 DIAGNOSIS — Z4822 Encounter for aftercare following kidney transplant: Secondary | ICD-10-CM | POA: Diagnosis not present

## 2018-01-17 DIAGNOSIS — E872 Acidosis: Secondary | ICD-10-CM | POA: Diagnosis not present

## 2018-01-17 DIAGNOSIS — B259 Cytomegaloviral disease, unspecified: Secondary | ICD-10-CM | POA: Diagnosis not present

## 2018-01-17 DIAGNOSIS — B258 Other cytomegaloviral diseases: Secondary | ICD-10-CM | POA: Diagnosis not present

## 2018-01-17 DIAGNOSIS — R945 Abnormal results of liver function studies: Secondary | ICD-10-CM | POA: Diagnosis not present

## 2018-01-17 DIAGNOSIS — R931 Abnormal findings on diagnostic imaging of heart and coronary circulation: Secondary | ICD-10-CM | POA: Diagnosis not present

## 2018-01-17 DIAGNOSIS — D899 Disorder involving the immune mechanism, unspecified: Secondary | ICD-10-CM | POA: Diagnosis not present

## 2018-01-17 DIAGNOSIS — Z7952 Long term (current) use of systemic steroids: Secondary | ICD-10-CM | POA: Diagnosis not present

## 2018-01-17 DIAGNOSIS — D8989 Other specified disorders involving the immune mechanism, not elsewhere classified: Secondary | ICD-10-CM | POA: Diagnosis not present

## 2018-01-17 DIAGNOSIS — Z79899 Other long term (current) drug therapy: Secondary | ICD-10-CM | POA: Diagnosis not present

## 2018-01-17 DIAGNOSIS — I10 Essential (primary) hypertension: Secondary | ICD-10-CM | POA: Diagnosis not present

## 2018-01-17 DIAGNOSIS — E213 Hyperparathyroidism, unspecified: Secondary | ICD-10-CM | POA: Diagnosis not present

## 2018-01-17 DIAGNOSIS — R031 Nonspecific low blood-pressure reading: Secondary | ICD-10-CM | POA: Diagnosis not present

## 2018-01-31 DIAGNOSIS — Z79899 Other long term (current) drug therapy: Secondary | ICD-10-CM | POA: Diagnosis not present

## 2018-01-31 DIAGNOSIS — D696 Thrombocytopenia, unspecified: Secondary | ICD-10-CM | POA: Diagnosis not present

## 2018-01-31 DIAGNOSIS — E890 Postprocedural hypothyroidism: Secondary | ICD-10-CM | POA: Diagnosis not present

## 2018-01-31 DIAGNOSIS — R945 Abnormal results of liver function studies: Secondary | ICD-10-CM | POA: Diagnosis not present

## 2018-01-31 DIAGNOSIS — R7989 Other specified abnormal findings of blood chemistry: Secondary | ICD-10-CM | POA: Diagnosis not present

## 2018-01-31 DIAGNOSIS — Z4822 Encounter for aftercare following kidney transplant: Secondary | ICD-10-CM | POA: Diagnosis not present

## 2018-01-31 DIAGNOSIS — Z940 Kidney transplant status: Secondary | ICD-10-CM | POA: Diagnosis not present

## 2018-01-31 DIAGNOSIS — I10 Essential (primary) hypertension: Secondary | ICD-10-CM | POA: Diagnosis not present

## 2018-01-31 DIAGNOSIS — D72819 Decreased white blood cell count, unspecified: Secondary | ICD-10-CM | POA: Diagnosis not present

## 2018-01-31 DIAGNOSIS — Z792 Long term (current) use of antibiotics: Secondary | ICD-10-CM | POA: Diagnosis not present

## 2018-01-31 DIAGNOSIS — B259 Cytomegaloviral disease, unspecified: Secondary | ICD-10-CM | POA: Diagnosis not present

## 2018-01-31 DIAGNOSIS — R739 Hyperglycemia, unspecified: Secondary | ICD-10-CM | POA: Diagnosis not present

## 2018-01-31 DIAGNOSIS — Z7952 Long term (current) use of systemic steroids: Secondary | ICD-10-CM | POA: Diagnosis not present

## 2018-01-31 DIAGNOSIS — B258 Other cytomegaloviral diseases: Secondary | ICD-10-CM | POA: Diagnosis not present

## 2018-01-31 DIAGNOSIS — D8989 Other specified disorders involving the immune mechanism, not elsewhere classified: Secondary | ICD-10-CM | POA: Diagnosis not present

## 2018-01-31 DIAGNOSIS — E872 Acidosis: Secondary | ICD-10-CM | POA: Diagnosis not present

## 2018-01-31 DIAGNOSIS — E213 Hyperparathyroidism, unspecified: Secondary | ICD-10-CM | POA: Diagnosis not present

## 2018-02-10 DIAGNOSIS — F419 Anxiety disorder, unspecified: Secondary | ICD-10-CM | POA: Diagnosis not present

## 2018-02-10 DIAGNOSIS — M545 Low back pain: Secondary | ICD-10-CM | POA: Diagnosis not present

## 2018-02-10 DIAGNOSIS — N529 Male erectile dysfunction, unspecified: Secondary | ICD-10-CM | POA: Diagnosis not present

## 2018-02-14 DIAGNOSIS — R945 Abnormal results of liver function studies: Secondary | ICD-10-CM | POA: Diagnosis not present

## 2018-02-14 DIAGNOSIS — N2581 Secondary hyperparathyroidism of renal origin: Secondary | ICD-10-CM | POA: Diagnosis not present

## 2018-02-14 DIAGNOSIS — Z79899 Other long term (current) drug therapy: Secondary | ICD-10-CM | POA: Diagnosis not present

## 2018-02-14 DIAGNOSIS — E871 Hypo-osmolality and hyponatremia: Secondary | ICD-10-CM | POA: Diagnosis not present

## 2018-02-14 DIAGNOSIS — E878 Other disorders of electrolyte and fluid balance, not elsewhere classified: Secondary | ICD-10-CM | POA: Diagnosis not present

## 2018-02-14 DIAGNOSIS — D6959 Other secondary thrombocytopenia: Secondary | ICD-10-CM | POA: Diagnosis not present

## 2018-02-14 DIAGNOSIS — Z9089 Acquired absence of other organs: Secondary | ICD-10-CM | POA: Diagnosis not present

## 2018-02-14 DIAGNOSIS — I10 Essential (primary) hypertension: Secondary | ICD-10-CM | POA: Diagnosis not present

## 2018-02-14 DIAGNOSIS — B259 Cytomegaloviral disease, unspecified: Secondary | ICD-10-CM | POA: Diagnosis not present

## 2018-02-14 DIAGNOSIS — E872 Acidosis: Secondary | ICD-10-CM | POA: Diagnosis not present

## 2018-02-14 DIAGNOSIS — D8989 Other specified disorders involving the immune mechanism, not elsewhere classified: Secondary | ICD-10-CM | POA: Diagnosis not present

## 2018-02-14 DIAGNOSIS — Z792 Long term (current) use of antibiotics: Secondary | ICD-10-CM | POA: Diagnosis not present

## 2018-02-14 DIAGNOSIS — Z940 Kidney transplant status: Secondary | ICD-10-CM | POA: Diagnosis not present

## 2018-02-14 DIAGNOSIS — R739 Hyperglycemia, unspecified: Secondary | ICD-10-CM | POA: Diagnosis not present

## 2018-02-14 DIAGNOSIS — D649 Anemia, unspecified: Secondary | ICD-10-CM | POA: Diagnosis not present

## 2018-02-14 DIAGNOSIS — D696 Thrombocytopenia, unspecified: Secondary | ICD-10-CM | POA: Diagnosis not present

## 2018-02-14 DIAGNOSIS — Z7952 Long term (current) use of systemic steroids: Secondary | ICD-10-CM | POA: Diagnosis not present

## 2018-02-14 DIAGNOSIS — Z4822 Encounter for aftercare following kidney transplant: Secondary | ICD-10-CM | POA: Diagnosis not present

## 2018-02-28 DIAGNOSIS — B259 Cytomegaloviral disease, unspecified: Secondary | ICD-10-CM | POA: Diagnosis not present

## 2018-03-14 DIAGNOSIS — Z7952 Long term (current) use of systemic steroids: Secondary | ICD-10-CM | POA: Diagnosis not present

## 2018-03-14 DIAGNOSIS — T8619 Other complication of kidney transplant: Secondary | ICD-10-CM | POA: Diagnosis not present

## 2018-03-14 DIAGNOSIS — D8989 Other specified disorders involving the immune mechanism, not elsewhere classified: Secondary | ICD-10-CM | POA: Diagnosis not present

## 2018-03-14 DIAGNOSIS — R739 Hyperglycemia, unspecified: Secondary | ICD-10-CM | POA: Diagnosis not present

## 2018-03-14 DIAGNOSIS — E871 Hypo-osmolality and hyponatremia: Secondary | ICD-10-CM | POA: Diagnosis not present

## 2018-03-14 DIAGNOSIS — B259 Cytomegaloviral disease, unspecified: Secondary | ICD-10-CM | POA: Diagnosis not present

## 2018-03-14 DIAGNOSIS — E872 Acidosis: Secondary | ICD-10-CM | POA: Diagnosis not present

## 2018-03-14 DIAGNOSIS — D696 Thrombocytopenia, unspecified: Secondary | ICD-10-CM | POA: Diagnosis not present

## 2018-03-14 DIAGNOSIS — B258 Other cytomegaloviral diseases: Secondary | ICD-10-CM | POA: Diagnosis not present

## 2018-03-14 DIAGNOSIS — Z4822 Encounter for aftercare following kidney transplant: Secondary | ICD-10-CM | POA: Diagnosis not present

## 2018-03-14 DIAGNOSIS — D649 Anemia, unspecified: Secondary | ICD-10-CM | POA: Diagnosis not present

## 2018-03-14 DIAGNOSIS — Z792 Long term (current) use of antibiotics: Secondary | ICD-10-CM | POA: Diagnosis not present

## 2018-03-14 DIAGNOSIS — Z940 Kidney transplant status: Secondary | ICD-10-CM | POA: Diagnosis not present

## 2018-03-14 DIAGNOSIS — Z79899 Other long term (current) drug therapy: Secondary | ICD-10-CM | POA: Diagnosis not present

## 2018-03-14 DIAGNOSIS — R945 Abnormal results of liver function studies: Secondary | ICD-10-CM | POA: Diagnosis not present

## 2018-03-14 DIAGNOSIS — I10 Essential (primary) hypertension: Secondary | ICD-10-CM | POA: Diagnosis not present

## 2018-03-14 DIAGNOSIS — D46B Refractory cytopenia with multilineage dysplasia and ring sideroblasts: Secondary | ICD-10-CM | POA: Diagnosis not present

## 2018-03-14 DIAGNOSIS — D6959 Other secondary thrombocytopenia: Secondary | ICD-10-CM | POA: Diagnosis not present

## 2018-04-19 DIAGNOSIS — R739 Hyperglycemia, unspecified: Secondary | ICD-10-CM | POA: Diagnosis not present

## 2018-04-19 DIAGNOSIS — B259 Cytomegaloviral disease, unspecified: Secondary | ICD-10-CM | POA: Diagnosis not present

## 2018-04-19 DIAGNOSIS — D8989 Other specified disorders involving the immune mechanism, not elsewhere classified: Secondary | ICD-10-CM | POA: Diagnosis not present

## 2018-04-19 DIAGNOSIS — Z7952 Long term (current) use of systemic steroids: Secondary | ICD-10-CM | POA: Diagnosis not present

## 2018-04-19 DIAGNOSIS — I120 Hypertensive chronic kidney disease with stage 5 chronic kidney disease or end stage renal disease: Secondary | ICD-10-CM | POA: Diagnosis not present

## 2018-04-19 DIAGNOSIS — E211 Secondary hyperparathyroidism, not elsewhere classified: Secondary | ICD-10-CM | POA: Diagnosis not present

## 2018-04-19 DIAGNOSIS — Z992 Dependence on renal dialysis: Secondary | ICD-10-CM | POA: Diagnosis not present

## 2018-04-19 DIAGNOSIS — Z940 Kidney transplant status: Secondary | ICD-10-CM | POA: Diagnosis not present

## 2018-04-19 DIAGNOSIS — Z888 Allergy status to other drugs, medicaments and biological substances status: Secondary | ICD-10-CM | POA: Diagnosis not present

## 2018-04-19 DIAGNOSIS — R945 Abnormal results of liver function studies: Secondary | ICD-10-CM | POA: Diagnosis not present

## 2018-04-19 DIAGNOSIS — Z4822 Encounter for aftercare following kidney transplant: Secondary | ICD-10-CM | POA: Diagnosis not present

## 2018-04-19 DIAGNOSIS — E782 Mixed hyperlipidemia: Secondary | ICD-10-CM | POA: Diagnosis not present

## 2018-04-19 DIAGNOSIS — E872 Acidosis: Secondary | ICD-10-CM | POA: Diagnosis not present

## 2018-04-19 DIAGNOSIS — N186 End stage renal disease: Secondary | ICD-10-CM | POA: Diagnosis not present

## 2018-04-19 DIAGNOSIS — E892 Postprocedural hypoparathyroidism: Secondary | ICD-10-CM | POA: Diagnosis not present

## 2018-04-19 DIAGNOSIS — D696 Thrombocytopenia, unspecified: Secondary | ICD-10-CM | POA: Diagnosis not present

## 2018-04-19 DIAGNOSIS — I10 Essential (primary) hypertension: Secondary | ICD-10-CM | POA: Diagnosis not present

## 2018-04-19 DIAGNOSIS — Z79899 Other long term (current) drug therapy: Secondary | ICD-10-CM | POA: Diagnosis not present

## 2018-04-19 DIAGNOSIS — N2581 Secondary hyperparathyroidism of renal origin: Secondary | ICD-10-CM | POA: Diagnosis not present

## 2018-04-19 DIAGNOSIS — B258 Other cytomegaloviral diseases: Secondary | ICD-10-CM | POA: Diagnosis not present

## 2018-04-19 DIAGNOSIS — E875 Hyperkalemia: Secondary | ICD-10-CM | POA: Diagnosis not present

## 2018-04-19 DIAGNOSIS — Z9089 Acquired absence of other organs: Secondary | ICD-10-CM | POA: Diagnosis not present

## 2018-05-09 DIAGNOSIS — R195 Other fecal abnormalities: Secondary | ICD-10-CM | POA: Diagnosis not present

## 2018-05-09 DIAGNOSIS — N186 End stage renal disease: Secondary | ICD-10-CM | POA: Diagnosis not present

## 2018-05-09 DIAGNOSIS — D899 Disorder involving the immune mechanism, unspecified: Secondary | ICD-10-CM | POA: Diagnosis not present

## 2018-05-09 DIAGNOSIS — I120 Hypertensive chronic kidney disease with stage 5 chronic kidney disease or end stage renal disease: Secondary | ICD-10-CM | POA: Diagnosis not present

## 2018-05-09 DIAGNOSIS — R739 Hyperglycemia, unspecified: Secondary | ICD-10-CM | POA: Diagnosis not present

## 2018-05-09 DIAGNOSIS — R197 Diarrhea, unspecified: Secondary | ICD-10-CM | POA: Diagnosis not present

## 2018-05-09 DIAGNOSIS — Z9889 Other specified postprocedural states: Secondary | ICD-10-CM | POA: Diagnosis not present

## 2018-05-09 DIAGNOSIS — Z7952 Long term (current) use of systemic steroids: Secondary | ICD-10-CM | POA: Diagnosis not present

## 2018-05-09 DIAGNOSIS — D696 Thrombocytopenia, unspecified: Secondary | ICD-10-CM | POA: Diagnosis not present

## 2018-05-09 DIAGNOSIS — Z4822 Encounter for aftercare following kidney transplant: Secondary | ICD-10-CM | POA: Diagnosis not present

## 2018-05-09 DIAGNOSIS — Z940 Kidney transplant status: Secondary | ICD-10-CM | POA: Diagnosis not present

## 2018-05-09 DIAGNOSIS — B259 Cytomegaloviral disease, unspecified: Secondary | ICD-10-CM | POA: Diagnosis not present

## 2018-05-09 DIAGNOSIS — R1013 Epigastric pain: Secondary | ICD-10-CM | POA: Diagnosis not present

## 2018-05-09 DIAGNOSIS — Z792 Long term (current) use of antibiotics: Secondary | ICD-10-CM | POA: Diagnosis not present

## 2018-05-09 DIAGNOSIS — I10 Essential (primary) hypertension: Secondary | ICD-10-CM | POA: Diagnosis not present

## 2018-05-09 DIAGNOSIS — N2581 Secondary hyperparathyroidism of renal origin: Secondary | ICD-10-CM | POA: Diagnosis not present

## 2018-05-09 DIAGNOSIS — Z79899 Other long term (current) drug therapy: Secondary | ICD-10-CM | POA: Diagnosis not present

## 2018-05-09 DIAGNOSIS — E872 Acidosis: Secondary | ICD-10-CM | POA: Diagnosis not present

## 2018-06-08 DIAGNOSIS — N529 Male erectile dysfunction, unspecified: Secondary | ICD-10-CM | POA: Diagnosis not present

## 2018-06-08 DIAGNOSIS — M79605 Pain in left leg: Secondary | ICD-10-CM | POA: Diagnosis not present

## 2018-06-08 DIAGNOSIS — I10 Essential (primary) hypertension: Secondary | ICD-10-CM | POA: Diagnosis not present

## 2018-06-08 DIAGNOSIS — M545 Low back pain: Secondary | ICD-10-CM | POA: Diagnosis not present

## 2018-06-08 DIAGNOSIS — F419 Anxiety disorder, unspecified: Secondary | ICD-10-CM | POA: Diagnosis not present

## 2018-06-14 DIAGNOSIS — T82590A Other mechanical complication of surgically created arteriovenous fistula, initial encounter: Secondary | ICD-10-CM | POA: Diagnosis not present

## 2018-06-14 DIAGNOSIS — T82858A Stenosis of vascular prosthetic devices, implants and grafts, initial encounter: Secondary | ICD-10-CM | POA: Diagnosis not present

## 2018-06-14 DIAGNOSIS — Z940 Kidney transplant status: Secondary | ICD-10-CM | POA: Diagnosis not present

## 2018-07-18 DIAGNOSIS — R0981 Nasal congestion: Secondary | ICD-10-CM | POA: Diagnosis not present

## 2018-07-18 DIAGNOSIS — Z792 Long term (current) use of antibiotics: Secondary | ICD-10-CM | POA: Diagnosis not present

## 2018-07-18 DIAGNOSIS — D696 Thrombocytopenia, unspecified: Secondary | ICD-10-CM | POA: Diagnosis not present

## 2018-07-18 DIAGNOSIS — R05 Cough: Secondary | ICD-10-CM | POA: Diagnosis not present

## 2018-07-18 DIAGNOSIS — D8989 Other specified disorders involving the immune mechanism, not elsewhere classified: Secondary | ICD-10-CM | POA: Diagnosis not present

## 2018-07-18 DIAGNOSIS — R112 Nausea with vomiting, unspecified: Secondary | ICD-10-CM | POA: Diagnosis not present

## 2018-07-18 DIAGNOSIS — Z79899 Other long term (current) drug therapy: Secondary | ICD-10-CM | POA: Diagnosis not present

## 2018-07-18 DIAGNOSIS — R51 Headache: Secondary | ICD-10-CM | POA: Diagnosis not present

## 2018-07-18 DIAGNOSIS — E079 Disorder of thyroid, unspecified: Secondary | ICD-10-CM | POA: Diagnosis not present

## 2018-07-18 DIAGNOSIS — B258 Other cytomegaloviral diseases: Secondary | ICD-10-CM | POA: Diagnosis not present

## 2018-07-18 DIAGNOSIS — B348 Other viral infections of unspecified site: Secondary | ICD-10-CM | POA: Diagnosis not present

## 2018-07-18 DIAGNOSIS — J479 Bronchiectasis, uncomplicated: Secondary | ICD-10-CM | POA: Diagnosis not present

## 2018-07-18 DIAGNOSIS — R739 Hyperglycemia, unspecified: Secondary | ICD-10-CM | POA: Diagnosis not present

## 2018-07-18 DIAGNOSIS — R197 Diarrhea, unspecified: Secondary | ICD-10-CM | POA: Diagnosis not present

## 2018-07-18 DIAGNOSIS — R509 Fever, unspecified: Secondary | ICD-10-CM | POA: Diagnosis not present

## 2018-07-18 DIAGNOSIS — R638 Other symptoms and signs concerning food and fluid intake: Secondary | ICD-10-CM | POA: Diagnosis not present

## 2018-07-18 DIAGNOSIS — B341 Enterovirus infection, unspecified: Secondary | ICD-10-CM | POA: Diagnosis not present

## 2018-07-18 DIAGNOSIS — Z940 Kidney transplant status: Secondary | ICD-10-CM | POA: Diagnosis not present

## 2018-07-18 DIAGNOSIS — E872 Acidosis: Secondary | ICD-10-CM | POA: Diagnosis not present

## 2018-07-18 DIAGNOSIS — Z7952 Long term (current) use of systemic steroids: Secondary | ICD-10-CM | POA: Diagnosis not present

## 2018-07-18 DIAGNOSIS — Z4822 Encounter for aftercare following kidney transplant: Secondary | ICD-10-CM | POA: Diagnosis not present

## 2018-07-18 DIAGNOSIS — I10 Essential (primary) hypertension: Secondary | ICD-10-CM | POA: Diagnosis not present

## 2018-07-18 DIAGNOSIS — R945 Abnormal results of liver function studies: Secondary | ICD-10-CM | POA: Diagnosis not present

## 2018-07-18 DIAGNOSIS — N2581 Secondary hyperparathyroidism of renal origin: Secondary | ICD-10-CM | POA: Diagnosis not present

## 2018-08-01 DIAGNOSIS — R1013 Epigastric pain: Secondary | ICD-10-CM | POA: Diagnosis not present

## 2018-08-01 DIAGNOSIS — B259 Cytomegaloviral disease, unspecified: Secondary | ICD-10-CM | POA: Diagnosis not present

## 2018-08-01 DIAGNOSIS — I10 Essential (primary) hypertension: Secondary | ICD-10-CM | POA: Diagnosis not present

## 2018-08-01 DIAGNOSIS — Z79899 Other long term (current) drug therapy: Secondary | ICD-10-CM | POA: Diagnosis not present

## 2018-08-01 DIAGNOSIS — Z4822 Encounter for aftercare following kidney transplant: Secondary | ICD-10-CM | POA: Diagnosis not present

## 2018-08-01 DIAGNOSIS — Z940 Kidney transplant status: Secondary | ICD-10-CM | POA: Diagnosis not present

## 2018-08-01 DIAGNOSIS — Z792 Long term (current) use of antibiotics: Secondary | ICD-10-CM | POA: Diagnosis not present

## 2018-08-01 DIAGNOSIS — Z7952 Long term (current) use of systemic steroids: Secondary | ICD-10-CM | POA: Diagnosis not present

## 2018-08-01 DIAGNOSIS — D8989 Other specified disorders involving the immune mechanism, not elsewhere classified: Secondary | ICD-10-CM | POA: Diagnosis not present

## 2018-08-01 DIAGNOSIS — Z23 Encounter for immunization: Secondary | ICD-10-CM | POA: Diagnosis not present

## 2018-08-01 DIAGNOSIS — E871 Hypo-osmolality and hyponatremia: Secondary | ICD-10-CM | POA: Diagnosis not present

## 2018-08-01 DIAGNOSIS — B341 Enterovirus infection, unspecified: Secondary | ICD-10-CM | POA: Diagnosis not present

## 2018-08-01 DIAGNOSIS — R7989 Other specified abnormal findings of blood chemistry: Secondary | ICD-10-CM | POA: Diagnosis not present

## 2018-08-01 DIAGNOSIS — E872 Acidosis: Secondary | ICD-10-CM | POA: Diagnosis not present

## 2018-08-01 DIAGNOSIS — D649 Anemia, unspecified: Secondary | ICD-10-CM | POA: Diagnosis not present

## 2018-08-01 DIAGNOSIS — E041 Nontoxic single thyroid nodule: Secondary | ICD-10-CM | POA: Diagnosis not present

## 2018-08-01 DIAGNOSIS — Z9089 Acquired absence of other organs: Secondary | ICD-10-CM | POA: Diagnosis not present

## 2018-08-01 DIAGNOSIS — E878 Other disorders of electrolyte and fluid balance, not elsewhere classified: Secondary | ICD-10-CM | POA: Diagnosis not present

## 2018-08-01 DIAGNOSIS — N2581 Secondary hyperparathyroidism of renal origin: Secondary | ICD-10-CM | POA: Diagnosis not present

## 2018-08-01 DIAGNOSIS — E1165 Type 2 diabetes mellitus with hyperglycemia: Secondary | ICD-10-CM | POA: Diagnosis not present

## 2018-08-01 DIAGNOSIS — Z888 Allergy status to other drugs, medicaments and biological substances status: Secondary | ICD-10-CM | POA: Diagnosis not present

## 2018-08-01 DIAGNOSIS — D696 Thrombocytopenia, unspecified: Secondary | ICD-10-CM | POA: Diagnosis not present

## 2018-08-29 DIAGNOSIS — D696 Thrombocytopenia, unspecified: Secondary | ICD-10-CM | POA: Diagnosis not present

## 2018-08-29 DIAGNOSIS — Z7952 Long term (current) use of systemic steroids: Secondary | ICD-10-CM | POA: Diagnosis not present

## 2018-08-29 DIAGNOSIS — E872 Acidosis: Secondary | ICD-10-CM | POA: Diagnosis not present

## 2018-08-29 DIAGNOSIS — I729 Aneurysm of unspecified site: Secondary | ICD-10-CM | POA: Diagnosis not present

## 2018-08-29 DIAGNOSIS — R7989 Other specified abnormal findings of blood chemistry: Secondary | ICD-10-CM | POA: Diagnosis not present

## 2018-08-29 DIAGNOSIS — T85858A Stenosis due to other internal prosthetic devices, implants and grafts, initial encounter: Secondary | ICD-10-CM | POA: Diagnosis not present

## 2018-08-29 DIAGNOSIS — D72819 Decreased white blood cell count, unspecified: Secondary | ICD-10-CM | POA: Diagnosis not present

## 2018-08-29 DIAGNOSIS — I10 Essential (primary) hypertension: Secondary | ICD-10-CM | POA: Diagnosis not present

## 2018-08-29 DIAGNOSIS — R195 Other fecal abnormalities: Secondary | ICD-10-CM | POA: Diagnosis not present

## 2018-08-29 DIAGNOSIS — Z9089 Acquired absence of other organs: Secondary | ICD-10-CM | POA: Diagnosis not present

## 2018-08-29 DIAGNOSIS — Z79899 Other long term (current) drug therapy: Secondary | ICD-10-CM | POA: Diagnosis not present

## 2018-08-29 DIAGNOSIS — D8989 Other specified disorders involving the immune mechanism, not elsewhere classified: Secondary | ICD-10-CM | POA: Diagnosis not present

## 2018-08-29 DIAGNOSIS — I770 Arteriovenous fistula, acquired: Secondary | ICD-10-CM | POA: Diagnosis not present

## 2018-08-29 DIAGNOSIS — Z792 Long term (current) use of antibiotics: Secondary | ICD-10-CM | POA: Diagnosis not present

## 2018-08-29 DIAGNOSIS — N2581 Secondary hyperparathyroidism of renal origin: Secondary | ICD-10-CM | POA: Diagnosis not present

## 2018-08-29 DIAGNOSIS — E213 Hyperparathyroidism, unspecified: Secondary | ICD-10-CM | POA: Diagnosis not present

## 2018-08-29 DIAGNOSIS — R1013 Epigastric pain: Secondary | ICD-10-CM | POA: Diagnosis not present

## 2018-08-29 DIAGNOSIS — Z4822 Encounter for aftercare following kidney transplant: Secondary | ICD-10-CM | POA: Diagnosis not present

## 2018-08-29 DIAGNOSIS — Z888 Allergy status to other drugs, medicaments and biological substances status: Secondary | ICD-10-CM | POA: Diagnosis not present

## 2018-08-29 DIAGNOSIS — Z940 Kidney transplant status: Secondary | ICD-10-CM | POA: Diagnosis not present

## 2018-09-07 DIAGNOSIS — Z0000 Encounter for general adult medical examination without abnormal findings: Secondary | ICD-10-CM | POA: Diagnosis not present

## 2018-09-07 DIAGNOSIS — M545 Low back pain: Secondary | ICD-10-CM | POA: Diagnosis not present

## 2018-09-07 DIAGNOSIS — Z940 Kidney transplant status: Secondary | ICD-10-CM | POA: Diagnosis not present

## 2018-09-07 DIAGNOSIS — F419 Anxiety disorder, unspecified: Secondary | ICD-10-CM | POA: Diagnosis not present

## 2018-09-07 DIAGNOSIS — E7800 Pure hypercholesterolemia, unspecified: Secondary | ICD-10-CM | POA: Diagnosis not present

## 2018-09-07 DIAGNOSIS — F900 Attention-deficit hyperactivity disorder, predominantly inattentive type: Secondary | ICD-10-CM | POA: Diagnosis not present

## 2018-09-07 DIAGNOSIS — I10 Essential (primary) hypertension: Secondary | ICD-10-CM | POA: Diagnosis not present

## 2018-10-13 DIAGNOSIS — D696 Thrombocytopenia, unspecified: Secondary | ICD-10-CM | POA: Diagnosis not present

## 2018-10-13 DIAGNOSIS — D649 Anemia, unspecified: Secondary | ICD-10-CM | POA: Diagnosis not present

## 2018-10-13 DIAGNOSIS — Z7952 Long term (current) use of systemic steroids: Secondary | ICD-10-CM | POA: Diagnosis not present

## 2018-10-13 DIAGNOSIS — Z4822 Encounter for aftercare following kidney transplant: Secondary | ICD-10-CM | POA: Diagnosis not present

## 2018-10-13 DIAGNOSIS — D8989 Other specified disorders involving the immune mechanism, not elsewhere classified: Secondary | ICD-10-CM | POA: Diagnosis not present

## 2018-10-13 DIAGNOSIS — Z9089 Acquired absence of other organs: Secondary | ICD-10-CM | POA: Diagnosis not present

## 2018-10-13 DIAGNOSIS — T82898A Other specified complication of vascular prosthetic devices, implants and grafts, initial encounter: Secondary | ICD-10-CM | POA: Diagnosis not present

## 2018-10-13 DIAGNOSIS — E871 Hypo-osmolality and hyponatremia: Secondary | ICD-10-CM | POA: Diagnosis not present

## 2018-10-13 DIAGNOSIS — I10 Essential (primary) hypertension: Secondary | ICD-10-CM | POA: Diagnosis not present

## 2018-10-13 DIAGNOSIS — R739 Hyperglycemia, unspecified: Secondary | ICD-10-CM | POA: Diagnosis not present

## 2018-10-13 DIAGNOSIS — D473 Essential (hemorrhagic) thrombocythemia: Secondary | ICD-10-CM | POA: Diagnosis not present

## 2018-10-13 DIAGNOSIS — E211 Secondary hyperparathyroidism, not elsewhere classified: Secondary | ICD-10-CM | POA: Diagnosis not present

## 2018-10-13 DIAGNOSIS — Z792 Long term (current) use of antibiotics: Secondary | ICD-10-CM | POA: Diagnosis not present

## 2018-10-13 DIAGNOSIS — T82858D Stenosis of vascular prosthetic devices, implants and grafts, subsequent encounter: Secondary | ICD-10-CM | POA: Diagnosis not present

## 2018-10-13 DIAGNOSIS — R7989 Other specified abnormal findings of blood chemistry: Secondary | ICD-10-CM | POA: Diagnosis not present

## 2018-10-13 DIAGNOSIS — Z7982 Long term (current) use of aspirin: Secondary | ICD-10-CM | POA: Diagnosis not present

## 2018-10-13 DIAGNOSIS — E872 Acidosis: Secondary | ICD-10-CM | POA: Diagnosis not present

## 2018-10-13 DIAGNOSIS — E878 Other disorders of electrolyte and fluid balance, not elsewhere classified: Secondary | ICD-10-CM | POA: Diagnosis not present

## 2018-10-13 DIAGNOSIS — Z862 Personal history of diseases of the blood and blood-forming organs and certain disorders involving the immune mechanism: Secondary | ICD-10-CM | POA: Diagnosis not present

## 2018-10-13 DIAGNOSIS — Z940 Kidney transplant status: Secondary | ICD-10-CM | POA: Diagnosis not present

## 2018-10-13 DIAGNOSIS — Z79899 Other long term (current) drug therapy: Secondary | ICD-10-CM | POA: Diagnosis not present

## 2018-11-30 DIAGNOSIS — I69354 Hemiplegia and hemiparesis following cerebral infarction affecting left non-dominant side: Secondary | ICD-10-CM | POA: Diagnosis not present

## 2018-11-30 DIAGNOSIS — Z5181 Encounter for therapeutic drug level monitoring: Secondary | ICD-10-CM | POA: Diagnosis not present

## 2018-11-30 DIAGNOSIS — Z4822 Encounter for aftercare following kidney transplant: Secondary | ICD-10-CM | POA: Diagnosis not present

## 2018-11-30 DIAGNOSIS — E785 Hyperlipidemia, unspecified: Secondary | ICD-10-CM | POA: Diagnosis present

## 2018-11-30 DIAGNOSIS — R51 Headache: Secondary | ICD-10-CM | POA: Diagnosis not present

## 2018-11-30 DIAGNOSIS — Z79899 Other long term (current) drug therapy: Secondary | ICD-10-CM | POA: Diagnosis not present

## 2018-11-30 DIAGNOSIS — H81399 Other peripheral vertigo, unspecified ear: Secondary | ICD-10-CM | POA: Diagnosis present

## 2018-11-30 DIAGNOSIS — R112 Nausea with vomiting, unspecified: Secondary | ICD-10-CM | POA: Diagnosis not present

## 2018-11-30 DIAGNOSIS — Z7982 Long term (current) use of aspirin: Secondary | ICD-10-CM | POA: Diagnosis not present

## 2018-11-30 DIAGNOSIS — Z940 Kidney transplant status: Secondary | ICD-10-CM | POA: Diagnosis not present

## 2018-11-30 DIAGNOSIS — R531 Weakness: Secondary | ICD-10-CM | POA: Diagnosis not present

## 2018-11-30 DIAGNOSIS — K219 Gastro-esophageal reflux disease without esophagitis: Secondary | ICD-10-CM | POA: Diagnosis not present

## 2018-11-30 DIAGNOSIS — R200 Anesthesia of skin: Secondary | ICD-10-CM | POA: Diagnosis not present

## 2018-11-30 DIAGNOSIS — I10 Essential (primary) hypertension: Secondary | ICD-10-CM | POA: Diagnosis not present

## 2018-11-30 DIAGNOSIS — Z8673 Personal history of transient ischemic attack (TIA), and cerebral infarction without residual deficits: Secondary | ICD-10-CM | POA: Diagnosis not present

## 2018-11-30 DIAGNOSIS — R42 Dizziness and giddiness: Secondary | ICD-10-CM | POA: Diagnosis not present

## 2018-11-30 DIAGNOSIS — F419 Anxiety disorder, unspecified: Secondary | ICD-10-CM | POA: Diagnosis not present

## 2018-11-30 DIAGNOSIS — R079 Chest pain, unspecified: Secondary | ICD-10-CM | POA: Diagnosis not present

## 2018-11-30 DIAGNOSIS — I160 Hypertensive urgency: Secondary | ICD-10-CM | POA: Diagnosis not present

## 2018-12-01 DIAGNOSIS — Z8673 Personal history of transient ischemic attack (TIA), and cerebral infarction without residual deficits: Secondary | ICD-10-CM | POA: Diagnosis not present

## 2018-12-01 DIAGNOSIS — Z7982 Long term (current) use of aspirin: Secondary | ICD-10-CM | POA: Diagnosis not present

## 2018-12-01 DIAGNOSIS — R079 Chest pain, unspecified: Secondary | ICD-10-CM | POA: Diagnosis not present

## 2018-12-01 DIAGNOSIS — R531 Weakness: Secondary | ICD-10-CM | POA: Diagnosis not present

## 2018-12-01 DIAGNOSIS — Z940 Kidney transplant status: Secondary | ICD-10-CM | POA: Diagnosis not present

## 2018-12-01 DIAGNOSIS — R200 Anesthesia of skin: Secondary | ICD-10-CM | POA: Diagnosis not present

## 2018-12-01 DIAGNOSIS — Z4822 Encounter for aftercare following kidney transplant: Secondary | ICD-10-CM | POA: Diagnosis not present

## 2018-12-01 DIAGNOSIS — R112 Nausea with vomiting, unspecified: Secondary | ICD-10-CM | POA: Diagnosis not present

## 2018-12-01 DIAGNOSIS — Z79899 Other long term (current) drug therapy: Secondary | ICD-10-CM | POA: Diagnosis not present

## 2018-12-01 DIAGNOSIS — I69354 Hemiplegia and hemiparesis following cerebral infarction affecting left non-dominant side: Secondary | ICD-10-CM | POA: Diagnosis not present

## 2018-12-01 DIAGNOSIS — R42 Dizziness and giddiness: Secondary | ICD-10-CM | POA: Diagnosis not present

## 2018-12-01 DIAGNOSIS — Z5181 Encounter for therapeutic drug level monitoring: Secondary | ICD-10-CM | POA: Diagnosis not present

## 2018-12-01 DIAGNOSIS — R51 Headache: Secondary | ICD-10-CM | POA: Diagnosis not present

## 2018-12-06 DIAGNOSIS — Z792 Long term (current) use of antibiotics: Secondary | ICD-10-CM | POA: Diagnosis not present

## 2018-12-06 DIAGNOSIS — E212 Other hyperparathyroidism: Secondary | ICD-10-CM | POA: Diagnosis not present

## 2018-12-06 DIAGNOSIS — R739 Hyperglycemia, unspecified: Secondary | ICD-10-CM | POA: Diagnosis not present

## 2018-12-06 DIAGNOSIS — D696 Thrombocytopenia, unspecified: Secondary | ICD-10-CM | POA: Diagnosis not present

## 2018-12-06 DIAGNOSIS — Z4822 Encounter for aftercare following kidney transplant: Secondary | ICD-10-CM | POA: Diagnosis not present

## 2018-12-06 DIAGNOSIS — R945 Abnormal results of liver function studies: Secondary | ICD-10-CM | POA: Diagnosis not present

## 2018-12-06 DIAGNOSIS — Z940 Kidney transplant status: Secondary | ICD-10-CM | POA: Diagnosis not present

## 2018-12-06 DIAGNOSIS — Z9089 Acquired absence of other organs: Secondary | ICD-10-CM | POA: Diagnosis not present

## 2018-12-06 DIAGNOSIS — E872 Acidosis: Secondary | ICD-10-CM | POA: Diagnosis not present

## 2018-12-06 DIAGNOSIS — T82898D Other specified complication of vascular prosthetic devices, implants and grafts, subsequent encounter: Secondary | ICD-10-CM | POA: Diagnosis not present

## 2018-12-06 DIAGNOSIS — I10 Essential (primary) hypertension: Secondary | ICD-10-CM | POA: Diagnosis not present

## 2018-12-06 DIAGNOSIS — D759 Disease of blood and blood-forming organs, unspecified: Secondary | ICD-10-CM | POA: Diagnosis not present

## 2018-12-06 DIAGNOSIS — R400 Somnolence: Secondary | ICD-10-CM | POA: Diagnosis not present

## 2018-12-06 DIAGNOSIS — Z79899 Other long term (current) drug therapy: Secondary | ICD-10-CM | POA: Diagnosis not present

## 2018-12-06 DIAGNOSIS — Z5181 Encounter for therapeutic drug level monitoring: Secondary | ICD-10-CM | POA: Diagnosis not present

## 2018-12-06 DIAGNOSIS — D8989 Other specified disorders involving the immune mechanism, not elsewhere classified: Secondary | ICD-10-CM | POA: Diagnosis not present

## 2018-12-06 DIAGNOSIS — R110 Nausea: Secondary | ICD-10-CM | POA: Diagnosis not present

## 2018-12-06 DIAGNOSIS — Z7952 Long term (current) use of systemic steroids: Secondary | ICD-10-CM | POA: Diagnosis not present

## 2018-12-06 DIAGNOSIS — R197 Diarrhea, unspecified: Secondary | ICD-10-CM | POA: Diagnosis not present

## 2018-12-06 DIAGNOSIS — I770 Arteriovenous fistula, acquired: Secondary | ICD-10-CM | POA: Diagnosis not present

## 2018-12-06 DIAGNOSIS — Z9889 Other specified postprocedural states: Secondary | ICD-10-CM | POA: Diagnosis not present

## 2018-12-06 DIAGNOSIS — N2581 Secondary hyperparathyroidism of renal origin: Secondary | ICD-10-CM | POA: Diagnosis not present

## 2018-12-06 DIAGNOSIS — R1013 Epigastric pain: Secondary | ICD-10-CM | POA: Diagnosis not present

## 2018-12-07 DIAGNOSIS — F419 Anxiety disorder, unspecified: Secondary | ICD-10-CM | POA: Diagnosis not present

## 2018-12-07 DIAGNOSIS — F900 Attention-deficit hyperactivity disorder, predominantly inattentive type: Secondary | ICD-10-CM | POA: Diagnosis not present

## 2018-12-07 DIAGNOSIS — Z940 Kidney transplant status: Secondary | ICD-10-CM | POA: Diagnosis not present

## 2018-12-07 DIAGNOSIS — M545 Low back pain: Secondary | ICD-10-CM | POA: Diagnosis not present

## 2018-12-07 DIAGNOSIS — I10 Essential (primary) hypertension: Secondary | ICD-10-CM | POA: Diagnosis not present

## 2018-12-13 DIAGNOSIS — R739 Hyperglycemia, unspecified: Secondary | ICD-10-CM | POA: Diagnosis not present

## 2018-12-13 DIAGNOSIS — R51 Headache: Secondary | ICD-10-CM | POA: Diagnosis not present

## 2018-12-13 DIAGNOSIS — R945 Abnormal results of liver function studies: Secondary | ICD-10-CM | POA: Diagnosis not present

## 2018-12-13 DIAGNOSIS — D696 Thrombocytopenia, unspecified: Secondary | ICD-10-CM | POA: Diagnosis not present

## 2018-12-13 DIAGNOSIS — I10 Essential (primary) hypertension: Secondary | ICD-10-CM | POA: Diagnosis not present

## 2018-12-13 DIAGNOSIS — Z940 Kidney transplant status: Secondary | ICD-10-CM | POA: Diagnosis not present

## 2018-12-13 DIAGNOSIS — T82898D Other specified complication of vascular prosthetic devices, implants and grafts, subsequent encounter: Secondary | ICD-10-CM | POA: Diagnosis not present

## 2018-12-13 DIAGNOSIS — N2581 Secondary hyperparathyroidism of renal origin: Secondary | ICD-10-CM | POA: Diagnosis not present

## 2018-12-13 DIAGNOSIS — Z888 Allergy status to other drugs, medicaments and biological substances status: Secondary | ICD-10-CM | POA: Diagnosis not present

## 2018-12-13 DIAGNOSIS — Z79899 Other long term (current) drug therapy: Secondary | ICD-10-CM | POA: Diagnosis not present

## 2018-12-13 DIAGNOSIS — E872 Acidosis: Secondary | ICD-10-CM | POA: Diagnosis not present

## 2018-12-13 DIAGNOSIS — Z7952 Long term (current) use of systemic steroids: Secondary | ICD-10-CM | POA: Diagnosis not present

## 2018-12-13 DIAGNOSIS — D8989 Other specified disorders involving the immune mechanism, not elsewhere classified: Secondary | ICD-10-CM | POA: Diagnosis not present

## 2018-12-13 DIAGNOSIS — H53149 Visual discomfort, unspecified: Secondary | ICD-10-CM | POA: Diagnosis not present

## 2018-12-13 DIAGNOSIS — R112 Nausea with vomiting, unspecified: Secondary | ICD-10-CM | POA: Diagnosis not present

## 2018-12-13 DIAGNOSIS — Z4822 Encounter for aftercare following kidney transplant: Secondary | ICD-10-CM | POA: Diagnosis not present

## 2018-12-13 DIAGNOSIS — E890 Postprocedural hypothyroidism: Secondary | ICD-10-CM | POA: Diagnosis not present

## 2019-01-24 DIAGNOSIS — Z792 Long term (current) use of antibiotics: Secondary | ICD-10-CM | POA: Diagnosis not present

## 2019-01-24 DIAGNOSIS — R110 Nausea: Secondary | ICD-10-CM | POA: Diagnosis not present

## 2019-01-24 DIAGNOSIS — R1013 Epigastric pain: Secondary | ICD-10-CM | POA: Diagnosis not present

## 2019-01-24 DIAGNOSIS — R51 Headache: Secondary | ICD-10-CM | POA: Diagnosis not present

## 2019-01-24 DIAGNOSIS — R251 Tremor, unspecified: Secondary | ICD-10-CM | POA: Diagnosis not present

## 2019-01-24 DIAGNOSIS — D696 Thrombocytopenia, unspecified: Secondary | ICD-10-CM | POA: Diagnosis not present

## 2019-01-24 DIAGNOSIS — G930 Cerebral cysts: Secondary | ICD-10-CM | POA: Diagnosis not present

## 2019-01-24 DIAGNOSIS — Z7952 Long term (current) use of systemic steroids: Secondary | ICD-10-CM | POA: Diagnosis not present

## 2019-01-24 DIAGNOSIS — E872 Acidosis: Secondary | ICD-10-CM | POA: Diagnosis not present

## 2019-01-24 DIAGNOSIS — H8110 Benign paroxysmal vertigo, unspecified ear: Secondary | ICD-10-CM | POA: Diagnosis not present

## 2019-01-24 DIAGNOSIS — I728 Aneurysm of other specified arteries: Secondary | ICD-10-CM | POA: Diagnosis not present

## 2019-01-24 DIAGNOSIS — R195 Other fecal abnormalities: Secondary | ICD-10-CM | POA: Diagnosis not present

## 2019-01-24 DIAGNOSIS — R7989 Other specified abnormal findings of blood chemistry: Secondary | ICD-10-CM | POA: Diagnosis not present

## 2019-01-24 DIAGNOSIS — H539 Unspecified visual disturbance: Secondary | ICD-10-CM | POA: Diagnosis not present

## 2019-01-24 DIAGNOSIS — Z79899 Other long term (current) drug therapy: Secondary | ICD-10-CM | POA: Diagnosis not present

## 2019-01-24 DIAGNOSIS — I10 Essential (primary) hypertension: Secondary | ICD-10-CM | POA: Diagnosis not present

## 2019-01-24 DIAGNOSIS — R739 Hyperglycemia, unspecified: Secondary | ICD-10-CM | POA: Diagnosis not present

## 2019-01-24 DIAGNOSIS — Z4822 Encounter for aftercare following kidney transplant: Secondary | ICD-10-CM | POA: Diagnosis not present

## 2019-01-24 DIAGNOSIS — Z940 Kidney transplant status: Secondary | ICD-10-CM | POA: Diagnosis not present

## 2019-01-24 DIAGNOSIS — N2581 Secondary hyperparathyroidism of renal origin: Secondary | ICD-10-CM | POA: Diagnosis not present

## 2019-01-24 DIAGNOSIS — D8989 Other specified disorders involving the immune mechanism, not elsewhere classified: Secondary | ICD-10-CM | POA: Diagnosis not present

## 2019-02-02 ENCOUNTER — Other Ambulatory Visit: Payer: Self-pay

## 2019-02-02 NOTE — Patient Outreach (Signed)
Medicare Tier 5 Screening:  Placed call to patient with no answer at home or cell phone. No machine.  PLAN: will Corporate treasurer. Call back in 3 business days.  Tomasa Rand, RN, BSN, CEN [facility] [employer] [phone]

## 2019-02-07 ENCOUNTER — Other Ambulatory Visit: Payer: Self-pay

## 2019-02-07 NOTE — Patient Outreach (Signed)
Screening/ tier 5 medicare  2nd attempt to reach patient unsuccessful.   PLAN: already mailed letter with attempt again in 3 days.  Tomasa Rand, RN, BSN, CEN [facility] [employer] [phone]

## 2019-02-12 ENCOUNTER — Other Ambulatory Visit: Payer: Self-pay

## 2019-02-12 NOTE — Patient Outreach (Signed)
Screening: Medicare tier 5  Placed call to patient for 3rd outreach attempt without success.  PLAN: will close case if no response to outreach letter in the next 4 days.   Tomasa Rand, RN, BSN, CEN [facility] [employer] [phone]

## 2019-02-16 ENCOUNTER — Other Ambulatory Visit: Payer: Self-pay

## 2019-02-16 NOTE — Patient Outreach (Signed)
Case closure:  No response to outreach attempts or letter.   PLAN: Close case and notify MD via letter.  Tomasa Rand, RN, BSN, CEN [facility] [employer] [phone]

## 2019-03-14 DIAGNOSIS — Z940 Kidney transplant status: Secondary | ICD-10-CM | POA: Diagnosis not present

## 2019-03-14 DIAGNOSIS — Z4822 Encounter for aftercare following kidney transplant: Secondary | ICD-10-CM | POA: Diagnosis not present

## 2019-03-14 DIAGNOSIS — R739 Hyperglycemia, unspecified: Secondary | ICD-10-CM | POA: Diagnosis not present

## 2019-03-14 DIAGNOSIS — Z5181 Encounter for therapeutic drug level monitoring: Secondary | ICD-10-CM | POA: Diagnosis not present

## 2019-03-14 DIAGNOSIS — N186 End stage renal disease: Secondary | ICD-10-CM | POA: Diagnosis not present

## 2019-03-14 DIAGNOSIS — D8989 Other specified disorders involving the immune mechanism, not elsewhere classified: Secondary | ICD-10-CM | POA: Diagnosis not present

## 2019-03-14 DIAGNOSIS — I10 Essential (primary) hypertension: Secondary | ICD-10-CM | POA: Diagnosis not present

## 2019-03-14 DIAGNOSIS — R42 Dizziness and giddiness: Secondary | ICD-10-CM | POA: Diagnosis not present

## 2019-03-14 DIAGNOSIS — Z79899 Other long term (current) drug therapy: Secondary | ICD-10-CM | POA: Diagnosis not present

## 2019-03-14 DIAGNOSIS — I120 Hypertensive chronic kidney disease with stage 5 chronic kidney disease or end stage renal disease: Secondary | ICD-10-CM | POA: Diagnosis not present

## 2019-03-15 DIAGNOSIS — D899 Disorder involving the immune mechanism, unspecified: Secondary | ICD-10-CM | POA: Diagnosis not present

## 2019-03-15 DIAGNOSIS — E7800 Pure hypercholesterolemia, unspecified: Secondary | ICD-10-CM | POA: Diagnosis not present

## 2019-03-15 DIAGNOSIS — F900 Attention-deficit hyperactivity disorder, predominantly inattentive type: Secondary | ICD-10-CM | POA: Diagnosis not present

## 2019-03-15 DIAGNOSIS — Z940 Kidney transplant status: Secondary | ICD-10-CM | POA: Diagnosis not present

## 2019-03-15 DIAGNOSIS — M545 Low back pain: Secondary | ICD-10-CM | POA: Diagnosis not present

## 2019-03-15 DIAGNOSIS — I10 Essential (primary) hypertension: Secondary | ICD-10-CM | POA: Diagnosis not present

## 2019-03-15 DIAGNOSIS — N529 Male erectile dysfunction, unspecified: Secondary | ICD-10-CM | POA: Diagnosis not present

## 2019-03-15 DIAGNOSIS — F419 Anxiety disorder, unspecified: Secondary | ICD-10-CM | POA: Diagnosis not present

## 2019-03-23 DIAGNOSIS — I120 Hypertensive chronic kidney disease with stage 5 chronic kidney disease or end stage renal disease: Secondary | ICD-10-CM | POA: Diagnosis not present

## 2019-03-23 DIAGNOSIS — Z01812 Encounter for preprocedural laboratory examination: Secondary | ICD-10-CM | POA: Diagnosis not present

## 2019-03-23 DIAGNOSIS — M549 Dorsalgia, unspecified: Secondary | ICD-10-CM | POA: Diagnosis not present

## 2019-03-23 DIAGNOSIS — Z1159 Encounter for screening for other viral diseases: Secondary | ICD-10-CM | POA: Diagnosis not present

## 2019-03-23 DIAGNOSIS — I770 Arteriovenous fistula, acquired: Secondary | ICD-10-CM | POA: Diagnosis not present

## 2019-03-23 DIAGNOSIS — D649 Anemia, unspecified: Secondary | ICD-10-CM | POA: Diagnosis not present

## 2019-03-23 DIAGNOSIS — Z940 Kidney transplant status: Secondary | ICD-10-CM | POA: Diagnosis not present

## 2019-03-23 DIAGNOSIS — E785 Hyperlipidemia, unspecified: Secondary | ICD-10-CM | POA: Diagnosis not present

## 2019-03-23 DIAGNOSIS — N529 Male erectile dysfunction, unspecified: Secondary | ICD-10-CM | POA: Diagnosis not present

## 2019-03-23 DIAGNOSIS — Z8673 Personal history of transient ischemic attack (TIA), and cerebral infarction without residual deficits: Secondary | ICD-10-CM | POA: Diagnosis not present

## 2019-03-23 DIAGNOSIS — Z7982 Long term (current) use of aspirin: Secondary | ICD-10-CM | POA: Diagnosis not present

## 2019-03-23 DIAGNOSIS — N2581 Secondary hyperparathyroidism of renal origin: Secondary | ICD-10-CM | POA: Diagnosis not present

## 2019-03-23 DIAGNOSIS — F419 Anxiety disorder, unspecified: Secondary | ICD-10-CM | POA: Diagnosis not present

## 2019-03-23 DIAGNOSIS — Z79899 Other long term (current) drug therapy: Secondary | ICD-10-CM | POA: Diagnosis not present

## 2019-03-23 DIAGNOSIS — D696 Thrombocytopenia, unspecified: Secondary | ICD-10-CM | POA: Diagnosis not present

## 2019-03-23 DIAGNOSIS — N186 End stage renal disease: Secondary | ICD-10-CM | POA: Diagnosis not present

## 2019-03-27 DIAGNOSIS — R269 Unspecified abnormalities of gait and mobility: Secondary | ICD-10-CM | POA: Diagnosis not present

## 2019-03-27 DIAGNOSIS — G43909 Migraine, unspecified, not intractable, without status migrainosus: Secondary | ICD-10-CM | POA: Diagnosis not present

## 2019-03-27 DIAGNOSIS — R42 Dizziness and giddiness: Secondary | ICD-10-CM | POA: Diagnosis not present

## 2019-04-03 DIAGNOSIS — N186 End stage renal disease: Secondary | ICD-10-CM | POA: Diagnosis not present

## 2019-04-03 DIAGNOSIS — M549 Dorsalgia, unspecified: Secondary | ICD-10-CM | POA: Diagnosis not present

## 2019-04-03 DIAGNOSIS — F419 Anxiety disorder, unspecified: Secondary | ICD-10-CM | POA: Diagnosis not present

## 2019-04-03 DIAGNOSIS — T82898A Other specified complication of vascular prosthetic devices, implants and grafts, initial encounter: Secondary | ICD-10-CM | POA: Diagnosis not present

## 2019-04-03 DIAGNOSIS — Z940 Kidney transplant status: Secondary | ICD-10-CM | POA: Diagnosis not present

## 2019-04-03 DIAGNOSIS — I120 Hypertensive chronic kidney disease with stage 5 chronic kidney disease or end stage renal disease: Secondary | ICD-10-CM | POA: Diagnosis not present

## 2019-04-03 DIAGNOSIS — I770 Arteriovenous fistula, acquired: Secondary | ICD-10-CM | POA: Diagnosis not present

## 2019-04-10 DIAGNOSIS — Z79899 Other long term (current) drug therapy: Secondary | ICD-10-CM | POA: Diagnosis not present

## 2019-04-10 DIAGNOSIS — Z7952 Long term (current) use of systemic steroids: Secondary | ICD-10-CM | POA: Diagnosis not present

## 2019-04-10 DIAGNOSIS — R1084 Generalized abdominal pain: Secondary | ICD-10-CM | POA: Diagnosis not present

## 2019-04-10 DIAGNOSIS — R5381 Other malaise: Secondary | ICD-10-CM | POA: Diagnosis not present

## 2019-04-10 DIAGNOSIS — R42 Dizziness and giddiness: Secondary | ICD-10-CM | POA: Diagnosis not present

## 2019-04-10 DIAGNOSIS — I10 Essential (primary) hypertension: Secondary | ICD-10-CM | POA: Diagnosis not present

## 2019-04-10 DIAGNOSIS — R112 Nausea with vomiting, unspecified: Secondary | ICD-10-CM | POA: Diagnosis not present

## 2019-04-10 DIAGNOSIS — Z940 Kidney transplant status: Secondary | ICD-10-CM | POA: Diagnosis not present

## 2019-04-10 DIAGNOSIS — F419 Anxiety disorder, unspecified: Secondary | ICD-10-CM | POA: Diagnosis not present

## 2019-04-10 DIAGNOSIS — Z20828 Contact with and (suspected) exposure to other viral communicable diseases: Secondary | ICD-10-CM | POA: Diagnosis not present

## 2019-04-10 DIAGNOSIS — R509 Fever, unspecified: Secondary | ICD-10-CM | POA: Diagnosis not present

## 2019-04-11 DIAGNOSIS — Z4822 Encounter for aftercare following kidney transplant: Secondary | ICD-10-CM | POA: Diagnosis not present

## 2019-04-11 DIAGNOSIS — Z5181 Encounter for therapeutic drug level monitoring: Secondary | ICD-10-CM | POA: Diagnosis not present

## 2019-04-11 DIAGNOSIS — Z9889 Other specified postprocedural states: Secondary | ICD-10-CM | POA: Diagnosis not present

## 2019-04-11 DIAGNOSIS — Z940 Kidney transplant status: Secondary | ICD-10-CM | POA: Diagnosis not present

## 2019-04-11 DIAGNOSIS — Z79899 Other long term (current) drug therapy: Secondary | ICD-10-CM | POA: Diagnosis not present

## 2019-04-11 DIAGNOSIS — R509 Fever, unspecified: Secondary | ICD-10-CM | POA: Diagnosis not present

## 2019-04-12 DIAGNOSIS — Z940 Kidney transplant status: Secondary | ICD-10-CM | POA: Diagnosis not present

## 2019-04-12 DIAGNOSIS — Z4822 Encounter for aftercare following kidney transplant: Secondary | ICD-10-CM | POA: Diagnosis not present

## 2019-04-12 DIAGNOSIS — R509 Fever, unspecified: Secondary | ICD-10-CM | POA: Diagnosis not present

## 2019-04-12 DIAGNOSIS — Z5181 Encounter for therapeutic drug level monitoring: Secondary | ICD-10-CM | POA: Diagnosis not present

## 2019-04-12 DIAGNOSIS — Z79899 Other long term (current) drug therapy: Secondary | ICD-10-CM | POA: Diagnosis not present

## 2019-04-24 DIAGNOSIS — Z4822 Encounter for aftercare following kidney transplant: Secondary | ICD-10-CM | POA: Diagnosis not present

## 2019-04-24 DIAGNOSIS — I10 Essential (primary) hypertension: Secondary | ICD-10-CM | POA: Diagnosis not present

## 2019-04-24 DIAGNOSIS — Z940 Kidney transplant status: Secondary | ICD-10-CM | POA: Diagnosis not present

## 2019-04-24 DIAGNOSIS — Z79899 Other long term (current) drug therapy: Secondary | ICD-10-CM | POA: Diagnosis not present

## 2019-04-24 DIAGNOSIS — E892 Postprocedural hypoparathyroidism: Secondary | ICD-10-CM | POA: Diagnosis not present

## 2019-04-24 DIAGNOSIS — Z792 Long term (current) use of antibiotics: Secondary | ICD-10-CM | POA: Diagnosis not present

## 2019-04-24 DIAGNOSIS — R197 Diarrhea, unspecified: Secondary | ICD-10-CM | POA: Diagnosis not present

## 2019-04-24 DIAGNOSIS — N2581 Secondary hyperparathyroidism of renal origin: Secondary | ICD-10-CM | POA: Diagnosis not present

## 2019-04-24 DIAGNOSIS — R42 Dizziness and giddiness: Secondary | ICD-10-CM | POA: Diagnosis not present

## 2019-04-24 DIAGNOSIS — Z7952 Long term (current) use of systemic steroids: Secondary | ICD-10-CM | POA: Diagnosis not present

## 2019-04-24 DIAGNOSIS — Z5181 Encounter for therapeutic drug level monitoring: Secondary | ICD-10-CM | POA: Diagnosis not present

## 2019-04-24 DIAGNOSIS — D696 Thrombocytopenia, unspecified: Secondary | ICD-10-CM | POA: Diagnosis not present

## 2019-04-24 DIAGNOSIS — R739 Hyperglycemia, unspecified: Secondary | ICD-10-CM | POA: Diagnosis not present

## 2019-04-24 DIAGNOSIS — Q2730 Arteriovenous malformation, site unspecified: Secondary | ICD-10-CM | POA: Diagnosis not present

## 2019-04-24 DIAGNOSIS — E872 Acidosis: Secondary | ICD-10-CM | POA: Diagnosis not present

## 2019-04-24 DIAGNOSIS — R7989 Other specified abnormal findings of blood chemistry: Secondary | ICD-10-CM | POA: Diagnosis not present

## 2019-05-04 DIAGNOSIS — Z940 Kidney transplant status: Secondary | ICD-10-CM | POA: Diagnosis not present

## 2019-05-04 DIAGNOSIS — Z4802 Encounter for removal of sutures: Secondary | ICD-10-CM | POA: Diagnosis not present

## 2019-06-15 DIAGNOSIS — F900 Attention-deficit hyperactivity disorder, predominantly inattentive type: Secondary | ICD-10-CM | POA: Diagnosis not present

## 2019-06-15 DIAGNOSIS — F419 Anxiety disorder, unspecified: Secondary | ICD-10-CM | POA: Diagnosis not present

## 2019-06-15 DIAGNOSIS — M545 Low back pain: Secondary | ICD-10-CM | POA: Diagnosis not present

## 2019-06-15 DIAGNOSIS — I10 Essential (primary) hypertension: Secondary | ICD-10-CM | POA: Diagnosis not present

## 2019-10-15 DIAGNOSIS — I10 Essential (primary) hypertension: Secondary | ICD-10-CM | POA: Diagnosis not present

## 2019-10-15 DIAGNOSIS — G43909 Migraine, unspecified, not intractable, without status migrainosus: Secondary | ICD-10-CM | POA: Diagnosis not present

## 2019-10-15 DIAGNOSIS — Z7982 Long term (current) use of aspirin: Secondary | ICD-10-CM | POA: Diagnosis not present

## 2019-10-15 DIAGNOSIS — E872 Acidosis: Secondary | ICD-10-CM | POA: Diagnosis not present

## 2019-10-15 DIAGNOSIS — Z4822 Encounter for aftercare following kidney transplant: Secondary | ICD-10-CM | POA: Diagnosis not present

## 2019-10-15 DIAGNOSIS — D649 Anemia, unspecified: Secondary | ICD-10-CM | POA: Diagnosis not present

## 2019-10-15 DIAGNOSIS — R42 Dizziness and giddiness: Secondary | ICD-10-CM | POA: Diagnosis not present

## 2019-10-15 DIAGNOSIS — N2581 Secondary hyperparathyroidism of renal origin: Secondary | ICD-10-CM | POA: Diagnosis not present

## 2019-10-15 DIAGNOSIS — Z940 Kidney transplant status: Secondary | ICD-10-CM | POA: Diagnosis not present

## 2019-10-15 DIAGNOSIS — E875 Hyperkalemia: Secondary | ICD-10-CM | POA: Diagnosis not present

## 2019-10-15 DIAGNOSIS — Z79899 Other long term (current) drug therapy: Secondary | ICD-10-CM | POA: Diagnosis not present

## 2019-10-15 DIAGNOSIS — Z792 Long term (current) use of antibiotics: Secondary | ICD-10-CM | POA: Diagnosis not present

## 2019-10-15 DIAGNOSIS — D84821 Immunodeficiency due to drugs: Secondary | ICD-10-CM | POA: Diagnosis not present

## 2019-10-15 DIAGNOSIS — Z7952 Long term (current) use of systemic steroids: Secondary | ICD-10-CM | POA: Diagnosis not present

## 2019-10-23 DIAGNOSIS — D696 Thrombocytopenia, unspecified: Secondary | ICD-10-CM | POA: Diagnosis not present

## 2019-11-05 DIAGNOSIS — R109 Unspecified abdominal pain: Secondary | ICD-10-CM | POA: Diagnosis not present

## 2019-11-05 DIAGNOSIS — K5900 Constipation, unspecified: Secondary | ICD-10-CM | POA: Diagnosis not present

## 2019-11-05 DIAGNOSIS — D696 Thrombocytopenia, unspecified: Secondary | ICD-10-CM | POA: Diagnosis not present

## 2019-11-30 DIAGNOSIS — Z7952 Long term (current) use of systemic steroids: Secondary | ICD-10-CM | POA: Diagnosis not present

## 2019-11-30 DIAGNOSIS — R7989 Other specified abnormal findings of blood chemistry: Secondary | ICD-10-CM | POA: Diagnosis not present

## 2019-11-30 DIAGNOSIS — D473 Essential (hemorrhagic) thrombocythemia: Secondary | ICD-10-CM | POA: Diagnosis not present

## 2019-11-30 DIAGNOSIS — Z792 Long term (current) use of antibiotics: Secondary | ICD-10-CM | POA: Diagnosis not present

## 2019-11-30 DIAGNOSIS — D84821 Immunodeficiency due to drugs: Secondary | ICD-10-CM | POA: Diagnosis not present

## 2019-11-30 DIAGNOSIS — I10 Essential (primary) hypertension: Secondary | ICD-10-CM | POA: Diagnosis not present

## 2019-11-30 DIAGNOSIS — R519 Headache, unspecified: Secondary | ICD-10-CM | POA: Diagnosis not present

## 2019-11-30 DIAGNOSIS — Z7982 Long term (current) use of aspirin: Secondary | ICD-10-CM | POA: Diagnosis not present

## 2019-11-30 DIAGNOSIS — B351 Tinea unguium: Secondary | ICD-10-CM | POA: Diagnosis not present

## 2019-11-30 DIAGNOSIS — H8110 Benign paroxysmal vertigo, unspecified ear: Secondary | ICD-10-CM | POA: Diagnosis not present

## 2019-11-30 DIAGNOSIS — Z4822 Encounter for aftercare following kidney transplant: Secondary | ICD-10-CM | POA: Diagnosis not present

## 2019-11-30 DIAGNOSIS — R739 Hyperglycemia, unspecified: Secondary | ICD-10-CM | POA: Diagnosis not present

## 2019-11-30 DIAGNOSIS — Z79899 Other long term (current) drug therapy: Secondary | ICD-10-CM | POA: Diagnosis not present

## 2019-12-13 DIAGNOSIS — N529 Male erectile dysfunction, unspecified: Secondary | ICD-10-CM | POA: Diagnosis not present

## 2019-12-13 DIAGNOSIS — M545 Low back pain: Secondary | ICD-10-CM | POA: Diagnosis not present

## 2019-12-13 DIAGNOSIS — F900 Attention-deficit hyperactivity disorder, predominantly inattentive type: Secondary | ICD-10-CM | POA: Diagnosis not present

## 2019-12-13 DIAGNOSIS — F419 Anxiety disorder, unspecified: Secondary | ICD-10-CM | POA: Diagnosis not present

## 2019-12-20 DIAGNOSIS — B351 Tinea unguium: Secondary | ICD-10-CM | POA: Diagnosis not present

## 2019-12-20 DIAGNOSIS — Z7952 Long term (current) use of systemic steroids: Secondary | ICD-10-CM | POA: Diagnosis not present

## 2019-12-20 DIAGNOSIS — H8110 Benign paroxysmal vertigo, unspecified ear: Secondary | ICD-10-CM | POA: Diagnosis not present

## 2019-12-20 DIAGNOSIS — G43909 Migraine, unspecified, not intractable, without status migrainosus: Secondary | ICD-10-CM | POA: Diagnosis not present

## 2019-12-20 DIAGNOSIS — R7989 Other specified abnormal findings of blood chemistry: Secondary | ICD-10-CM | POA: Diagnosis not present

## 2019-12-20 DIAGNOSIS — Z888 Allergy status to other drugs, medicaments and biological substances status: Secondary | ICD-10-CM | POA: Diagnosis not present

## 2019-12-20 DIAGNOSIS — Z79899 Other long term (current) drug therapy: Secondary | ICD-10-CM | POA: Diagnosis not present

## 2019-12-20 DIAGNOSIS — Z940 Kidney transplant status: Secondary | ICD-10-CM | POA: Diagnosis not present

## 2019-12-20 DIAGNOSIS — Z9089 Acquired absence of other organs: Secondary | ICD-10-CM | POA: Diagnosis not present

## 2019-12-20 DIAGNOSIS — Z4822 Encounter for aftercare following kidney transplant: Secondary | ICD-10-CM | POA: Diagnosis not present

## 2019-12-20 DIAGNOSIS — I10 Essential (primary) hypertension: Secondary | ICD-10-CM | POA: Diagnosis not present

## 2019-12-20 DIAGNOSIS — R739 Hyperglycemia, unspecified: Secondary | ICD-10-CM | POA: Diagnosis not present

## 2019-12-20 DIAGNOSIS — Z9889 Other specified postprocedural states: Secondary | ICD-10-CM | POA: Diagnosis not present

## 2020-01-21 DIAGNOSIS — R300 Dysuria: Secondary | ICD-10-CM | POA: Diagnosis not present

## 2020-01-25 DIAGNOSIS — I129 Hypertensive chronic kidney disease with stage 1 through stage 4 chronic kidney disease, or unspecified chronic kidney disease: Secondary | ICD-10-CM | POA: Diagnosis not present

## 2020-01-25 DIAGNOSIS — Z940 Kidney transplant status: Secondary | ICD-10-CM | POA: Diagnosis not present

## 2020-01-25 DIAGNOSIS — N189 Chronic kidney disease, unspecified: Secondary | ICD-10-CM | POA: Diagnosis not present

## 2020-01-25 DIAGNOSIS — N281 Cyst of kidney, acquired: Secondary | ICD-10-CM | POA: Diagnosis not present

## 2020-01-25 DIAGNOSIS — Z20822 Contact with and (suspected) exposure to covid-19: Secondary | ICD-10-CM | POA: Diagnosis not present

## 2020-01-25 DIAGNOSIS — N390 Urinary tract infection, site not specified: Secondary | ICD-10-CM | POA: Diagnosis not present

## 2020-01-25 DIAGNOSIS — D849 Immunodeficiency, unspecified: Secondary | ICD-10-CM | POA: Diagnosis not present

## 2020-01-25 DIAGNOSIS — R112 Nausea with vomiting, unspecified: Secondary | ICD-10-CM | POA: Diagnosis not present

## 2020-01-25 DIAGNOSIS — E871 Hypo-osmolality and hyponatremia: Secondary | ICD-10-CM | POA: Diagnosis not present

## 2020-01-25 DIAGNOSIS — T8612 Kidney transplant failure: Secondary | ICD-10-CM | POA: Diagnosis not present

## 2020-01-25 DIAGNOSIS — N179 Acute kidney failure, unspecified: Secondary | ICD-10-CM | POA: Diagnosis not present

## 2020-01-25 DIAGNOSIS — R300 Dysuria: Secondary | ICD-10-CM | POA: Diagnosis not present

## 2020-01-25 DIAGNOSIS — G8929 Other chronic pain: Secondary | ICD-10-CM | POA: Diagnosis not present

## 2020-01-25 DIAGNOSIS — D696 Thrombocytopenia, unspecified: Secondary | ICD-10-CM | POA: Diagnosis not present

## 2020-01-25 DIAGNOSIS — E785 Hyperlipidemia, unspecified: Secondary | ICD-10-CM | POA: Diagnosis not present

## 2020-01-25 DIAGNOSIS — Z8673 Personal history of transient ischemic attack (TIA), and cerebral infarction without residual deficits: Secondary | ICD-10-CM | POA: Diagnosis not present

## 2020-01-25 DIAGNOSIS — R5383 Other fatigue: Secondary | ICD-10-CM | POA: Diagnosis not present

## 2020-01-25 DIAGNOSIS — T8619 Other complication of kidney transplant: Secondary | ICD-10-CM | POA: Diagnosis not present

## 2020-01-25 DIAGNOSIS — R509 Fever, unspecified: Secondary | ICD-10-CM | POA: Diagnosis not present

## 2020-01-26 DIAGNOSIS — I120 Hypertensive chronic kidney disease with stage 5 chronic kidney disease or end stage renal disease: Secondary | ICD-10-CM | POA: Diagnosis not present

## 2020-01-26 DIAGNOSIS — N189 Chronic kidney disease, unspecified: Secondary | ICD-10-CM | POA: Diagnosis not present

## 2020-01-26 DIAGNOSIS — N390 Urinary tract infection, site not specified: Secondary | ICD-10-CM | POA: Diagnosis not present

## 2020-01-26 DIAGNOSIS — Z940 Kidney transplant status: Secondary | ICD-10-CM | POA: Diagnosis not present

## 2020-01-26 DIAGNOSIS — T8619 Other complication of kidney transplant: Secondary | ICD-10-CM | POA: Diagnosis not present

## 2020-01-26 DIAGNOSIS — N179 Acute kidney failure, unspecified: Secondary | ICD-10-CM | POA: Diagnosis not present

## 2020-01-27 DIAGNOSIS — I120 Hypertensive chronic kidney disease with stage 5 chronic kidney disease or end stage renal disease: Secondary | ICD-10-CM | POA: Diagnosis not present

## 2020-01-27 DIAGNOSIS — N390 Urinary tract infection, site not specified: Secondary | ICD-10-CM | POA: Diagnosis not present

## 2020-01-27 DIAGNOSIS — R3129 Other microscopic hematuria: Secondary | ICD-10-CM | POA: Diagnosis not present

## 2020-01-27 DIAGNOSIS — Z20822 Contact with and (suspected) exposure to covid-19: Secondary | ICD-10-CM | POA: Diagnosis present

## 2020-01-27 DIAGNOSIS — I129 Hypertensive chronic kidney disease with stage 1 through stage 4 chronic kidney disease, or unspecified chronic kidney disease: Secondary | ICD-10-CM | POA: Diagnosis not present

## 2020-01-27 DIAGNOSIS — E871 Hypo-osmolality and hyponatremia: Secondary | ICD-10-CM | POA: Diagnosis present

## 2020-01-27 DIAGNOSIS — D849 Immunodeficiency, unspecified: Secondary | ICD-10-CM | POA: Diagnosis not present

## 2020-01-27 DIAGNOSIS — E860 Dehydration: Secondary | ICD-10-CM | POA: Diagnosis present

## 2020-01-27 DIAGNOSIS — N182 Chronic kidney disease, stage 2 (mild): Secondary | ICD-10-CM | POA: Diagnosis not present

## 2020-01-27 DIAGNOSIS — B957 Other staphylococcus as the cause of diseases classified elsewhere: Secondary | ICD-10-CM | POA: Diagnosis not present

## 2020-01-27 DIAGNOSIS — Z79899 Other long term (current) drug therapy: Secondary | ICD-10-CM | POA: Diagnosis not present

## 2020-01-27 DIAGNOSIS — R300 Dysuria: Secondary | ICD-10-CM | POA: Diagnosis not present

## 2020-01-27 DIAGNOSIS — B9689 Other specified bacterial agents as the cause of diseases classified elsewhere: Secondary | ICD-10-CM | POA: Diagnosis not present

## 2020-01-27 DIAGNOSIS — R319 Hematuria, unspecified: Secondary | ICD-10-CM | POA: Diagnosis not present

## 2020-01-27 DIAGNOSIS — G894 Chronic pain syndrome: Secondary | ICD-10-CM | POA: Diagnosis present

## 2020-01-27 DIAGNOSIS — N179 Acute kidney failure, unspecified: Secondary | ICD-10-CM | POA: Diagnosis not present

## 2020-01-27 DIAGNOSIS — R8289 Other abnormal findings on cytological and histological examination of urine: Secondary | ICD-10-CM | POA: Diagnosis not present

## 2020-01-27 DIAGNOSIS — Z7982 Long term (current) use of aspirin: Secondary | ICD-10-CM | POA: Diagnosis not present

## 2020-01-27 DIAGNOSIS — R509 Fever, unspecified: Secondary | ICD-10-CM | POA: Diagnosis not present

## 2020-01-27 DIAGNOSIS — N189 Chronic kidney disease, unspecified: Secondary | ICD-10-CM | POA: Diagnosis not present

## 2020-01-27 DIAGNOSIS — T8619 Other complication of kidney transplant: Secondary | ICD-10-CM | POA: Diagnosis not present

## 2020-01-27 DIAGNOSIS — R310 Gross hematuria: Secondary | ICD-10-CM | POA: Diagnosis not present

## 2020-01-27 DIAGNOSIS — R7881 Bacteremia: Secondary | ICD-10-CM | POA: Diagnosis not present

## 2020-01-27 DIAGNOSIS — Z940 Kidney transplant status: Secondary | ICD-10-CM | POA: Diagnosis not present

## 2020-01-27 DIAGNOSIS — D696 Thrombocytopenia, unspecified: Secondary | ICD-10-CM | POA: Diagnosis not present

## 2020-01-27 DIAGNOSIS — Z8673 Personal history of transient ischemic attack (TIA), and cerebral infarction without residual deficits: Secondary | ICD-10-CM | POA: Diagnosis not present

## 2020-01-27 DIAGNOSIS — Z87440 Personal history of urinary (tract) infections: Secondary | ICD-10-CM | POA: Diagnosis not present

## 2020-01-27 DIAGNOSIS — E785 Hyperlipidemia, unspecified: Secondary | ICD-10-CM | POA: Diagnosis present

## 2020-02-05 DIAGNOSIS — I10 Essential (primary) hypertension: Secondary | ICD-10-CM | POA: Diagnosis not present

## 2020-02-05 DIAGNOSIS — N179 Acute kidney failure, unspecified: Secondary | ICD-10-CM | POA: Diagnosis not present

## 2020-02-13 DIAGNOSIS — I10 Essential (primary) hypertension: Secondary | ICD-10-CM | POA: Diagnosis not present

## 2020-02-13 DIAGNOSIS — Z79899 Other long term (current) drug therapy: Secondary | ICD-10-CM | POA: Diagnosis not present

## 2020-02-13 DIAGNOSIS — G43909 Migraine, unspecified, not intractable, without status migrainosus: Secondary | ICD-10-CM | POA: Diagnosis not present

## 2020-02-13 DIAGNOSIS — Z792 Long term (current) use of antibiotics: Secondary | ICD-10-CM | POA: Diagnosis not present

## 2020-02-13 DIAGNOSIS — Z4822 Encounter for aftercare following kidney transplant: Secondary | ICD-10-CM | POA: Diagnosis not present

## 2020-02-13 DIAGNOSIS — H8110 Benign paroxysmal vertigo, unspecified ear: Secondary | ICD-10-CM | POA: Diagnosis not present

## 2020-02-13 DIAGNOSIS — D759 Disease of blood and blood-forming organs, unspecified: Secondary | ICD-10-CM | POA: Diagnosis not present

## 2020-02-13 DIAGNOSIS — Z7952 Long term (current) use of systemic steroids: Secondary | ICD-10-CM | POA: Diagnosis not present

## 2020-02-13 DIAGNOSIS — R739 Hyperglycemia, unspecified: Secondary | ICD-10-CM | POA: Diagnosis not present

## 2020-02-13 DIAGNOSIS — D849 Immunodeficiency, unspecified: Secondary | ICD-10-CM | POA: Diagnosis not present

## 2020-02-13 DIAGNOSIS — E892 Postprocedural hypoparathyroidism: Secondary | ICD-10-CM | POA: Diagnosis not present

## 2020-02-13 DIAGNOSIS — B351 Tinea unguium: Secondary | ICD-10-CM | POA: Diagnosis not present

## 2020-02-13 DIAGNOSIS — Z940 Kidney transplant status: Secondary | ICD-10-CM | POA: Diagnosis not present

## 2020-02-13 DIAGNOSIS — Z9889 Other specified postprocedural states: Secondary | ICD-10-CM | POA: Diagnosis not present

## 2020-05-27 DIAGNOSIS — G43909 Migraine, unspecified, not intractable, without status migrainosus: Secondary | ICD-10-CM | POA: Diagnosis not present

## 2020-05-27 DIAGNOSIS — Z940 Kidney transplant status: Secondary | ICD-10-CM | POA: Diagnosis not present

## 2020-05-27 DIAGNOSIS — I120 Hypertensive chronic kidney disease with stage 5 chronic kidney disease or end stage renal disease: Secondary | ICD-10-CM | POA: Diagnosis not present

## 2020-05-27 DIAGNOSIS — N2889 Other specified disorders of kidney and ureter: Secondary | ICD-10-CM | POA: Diagnosis not present

## 2020-05-27 DIAGNOSIS — D849 Immunodeficiency, unspecified: Secondary | ICD-10-CM | POA: Diagnosis not present

## 2020-05-27 DIAGNOSIS — R319 Hematuria, unspecified: Secondary | ICD-10-CM | POA: Diagnosis not present

## 2020-05-27 DIAGNOSIS — Z4822 Encounter for aftercare following kidney transplant: Secondary | ICD-10-CM | POA: Diagnosis not present

## 2020-05-27 DIAGNOSIS — H109 Unspecified conjunctivitis: Secondary | ICD-10-CM | POA: Diagnosis not present

## 2020-05-27 DIAGNOSIS — Z5181 Encounter for therapeutic drug level monitoring: Secondary | ICD-10-CM | POA: Diagnosis not present

## 2020-05-27 DIAGNOSIS — Z9089 Acquired absence of other organs: Secondary | ICD-10-CM | POA: Diagnosis not present

## 2020-05-27 DIAGNOSIS — H8110 Benign paroxysmal vertigo, unspecified ear: Secondary | ICD-10-CM | POA: Diagnosis not present

## 2020-05-27 DIAGNOSIS — N2581 Secondary hyperparathyroidism of renal origin: Secondary | ICD-10-CM | POA: Diagnosis not present

## 2020-05-27 DIAGNOSIS — Z992 Dependence on renal dialysis: Secondary | ICD-10-CM | POA: Diagnosis not present

## 2020-05-27 DIAGNOSIS — R739 Hyperglycemia, unspecified: Secondary | ICD-10-CM | POA: Diagnosis not present

## 2020-05-27 DIAGNOSIS — Z0001 Encounter for general adult medical examination with abnormal findings: Secondary | ICD-10-CM | POA: Diagnosis not present

## 2020-05-27 DIAGNOSIS — Z888 Allergy status to other drugs, medicaments and biological substances status: Secondary | ICD-10-CM | POA: Diagnosis not present

## 2020-05-27 DIAGNOSIS — Z79899 Other long term (current) drug therapy: Secondary | ICD-10-CM | POA: Diagnosis not present

## 2020-05-27 DIAGNOSIS — I151 Hypertension secondary to other renal disorders: Secondary | ICD-10-CM | POA: Diagnosis not present

## 2020-05-27 DIAGNOSIS — Z8619 Personal history of other infectious and parasitic diseases: Secondary | ICD-10-CM | POA: Diagnosis not present

## 2020-05-27 DIAGNOSIS — N186 End stage renal disease: Secondary | ICD-10-CM | POA: Diagnosis not present

## 2020-05-27 DIAGNOSIS — D696 Thrombocytopenia, unspecified: Secondary | ICD-10-CM | POA: Diagnosis not present

## 2020-06-03 DIAGNOSIS — Z23 Encounter for immunization: Secondary | ICD-10-CM | POA: Diagnosis not present

## 2020-06-03 DIAGNOSIS — F900 Attention-deficit hyperactivity disorder, predominantly inattentive type: Secondary | ICD-10-CM | POA: Diagnosis not present

## 2020-06-03 DIAGNOSIS — F419 Anxiety disorder, unspecified: Secondary | ICD-10-CM | POA: Diagnosis not present

## 2020-06-03 DIAGNOSIS — M545 Low back pain: Secondary | ICD-10-CM | POA: Diagnosis not present

## 2020-09-01 DIAGNOSIS — N2581 Secondary hyperparathyroidism of renal origin: Secondary | ICD-10-CM | POA: Diagnosis not present

## 2020-09-01 DIAGNOSIS — Z940 Kidney transplant status: Secondary | ICD-10-CM | POA: Diagnosis not present

## 2020-09-01 DIAGNOSIS — I10 Essential (primary) hypertension: Secondary | ICD-10-CM | POA: Diagnosis not present

## 2020-09-01 DIAGNOSIS — E7800 Pure hypercholesterolemia, unspecified: Secondary | ICD-10-CM | POA: Diagnosis not present

## 2020-09-01 DIAGNOSIS — F900 Attention-deficit hyperactivity disorder, predominantly inattentive type: Secondary | ICD-10-CM | POA: Diagnosis not present

## 2020-09-01 DIAGNOSIS — F419 Anxiety disorder, unspecified: Secondary | ICD-10-CM | POA: Diagnosis not present

## 2020-09-01 DIAGNOSIS — D631 Anemia in chronic kidney disease: Secondary | ICD-10-CM | POA: Diagnosis not present

## 2020-09-01 DIAGNOSIS — N529 Male erectile dysfunction, unspecified: Secondary | ICD-10-CM | POA: Diagnosis not present

## 2020-09-01 DIAGNOSIS — M545 Low back pain, unspecified: Secondary | ICD-10-CM | POA: Diagnosis not present

## 2022-04-05 ENCOUNTER — Emergency Department (HOSPITAL_COMMUNITY): Payer: Medicare Other

## 2022-04-05 ENCOUNTER — Inpatient Hospital Stay (HOSPITAL_COMMUNITY): Payer: Medicare Other | Admitting: Anesthesiology

## 2022-04-05 ENCOUNTER — Encounter (HOSPITAL_COMMUNITY): Payer: Self-pay | Admitting: General Surgery

## 2022-04-05 ENCOUNTER — Other Ambulatory Visit: Payer: Self-pay

## 2022-04-05 ENCOUNTER — Inpatient Hospital Stay (HOSPITAL_COMMUNITY)
Admission: EM | Admit: 2022-04-05 | Discharge: 2022-04-13 | DRG: 908 | Disposition: A | Discharge status: Home Health | Payer: Medicare Other | Attending: General Surgery | Admitting: General Surgery

## 2022-04-05 ENCOUNTER — Encounter (HOSPITAL_COMMUNITY): Admission: EM | Disposition: A | Discharge status: Home Health | Payer: Self-pay | Source: Home / Self Care

## 2022-04-05 ENCOUNTER — Inpatient Hospital Stay (HOSPITAL_COMMUNITY): Payer: Medicare Other

## 2022-04-05 DIAGNOSIS — W3400XA Accidental discharge from unspecified firearms or gun, initial encounter: Principal | ICD-10-CM

## 2022-04-05 DIAGNOSIS — I10 Essential (primary) hypertension: Secondary | ICD-10-CM

## 2022-04-05 DIAGNOSIS — N289 Disorder of kidney and ureter, unspecified: Secondary | ICD-10-CM

## 2022-04-05 DIAGNOSIS — S71132A Puncture wound without foreign body, left thigh, initial encounter: Secondary | ICD-10-CM

## 2022-04-05 DIAGNOSIS — Z7982 Long term (current) use of aspirin: Secondary | ICD-10-CM

## 2022-04-05 DIAGNOSIS — I70222 Atherosclerosis of native arteries of extremities with rest pain, left leg: Secondary | ICD-10-CM | POA: Diagnosis not present

## 2022-04-05 DIAGNOSIS — D62 Acute posthemorrhagic anemia: Secondary | ICD-10-CM | POA: Diagnosis not present

## 2022-04-05 DIAGNOSIS — M21372 Foot drop, left foot: Secondary | ICD-10-CM | POA: Diagnosis present

## 2022-04-05 DIAGNOSIS — E785 Hyperlipidemia, unspecified: Secondary | ICD-10-CM | POA: Diagnosis present

## 2022-04-05 DIAGNOSIS — S72402A Unspecified fracture of lower end of left femur, initial encounter for closed fracture: Secondary | ICD-10-CM | POA: Diagnosis present

## 2022-04-05 DIAGNOSIS — Z8673 Personal history of transient ischemic attack (TIA), and cerebral infarction without residual deficits: Secondary | ICD-10-CM | POA: Diagnosis not present

## 2022-04-05 DIAGNOSIS — Z79899 Other long term (current) drug therapy: Secondary | ICD-10-CM

## 2022-04-05 DIAGNOSIS — Z888 Allergy status to other drugs, medicaments and biological substances status: Secondary | ICD-10-CM | POA: Diagnosis not present

## 2022-04-05 DIAGNOSIS — S85002A Unspecified injury of popliteal artery, left leg, initial encounter: Secondary | ICD-10-CM | POA: Diagnosis present

## 2022-04-05 DIAGNOSIS — Z940 Kidney transplant status: Secondary | ICD-10-CM

## 2022-04-05 DIAGNOSIS — S85092A Other specified injury of popliteal artery, left leg, initial encounter: Secondary | ICD-10-CM | POA: Diagnosis not present

## 2022-04-05 DIAGNOSIS — W3409XA Accidental discharge from other specified firearms, initial encounter: Secondary | ICD-10-CM | POA: Diagnosis not present

## 2022-04-05 HISTORY — DX: Chronic kidney disease, unspecified: N18.9

## 2022-04-05 HISTORY — PX: FASCIECTOMY: SHX6525

## 2022-04-05 HISTORY — DX: Essential (primary) hypertension: I10

## 2022-04-05 HISTORY — PX: FEMORAL ARTERY EXPLORATION: SHX5160

## 2022-04-05 HISTORY — PX: INTRAOPERATIVE ARTERIOGRAM: SHX5157

## 2022-04-05 LAB — I-STAT CHEM 8, ED
BUN: 26 mg/dL — ABNORMAL HIGH (ref 6–20)
Calcium, Ion: 1.18 mmol/L (ref 1.15–1.40)
Chloride: 107 mmol/L (ref 98–111)
Creatinine, Ser: 1.4 mg/dL — ABNORMAL HIGH (ref 0.61–1.24)
Glucose, Bld: 177 mg/dL — ABNORMAL HIGH (ref 70–99)
HCT: 43 % (ref 39.0–52.0)
Hemoglobin: 14.6 g/dL (ref 13.0–17.0)
Potassium: 4.5 mmol/L (ref 3.5–5.1)
Sodium: 137 mmol/L (ref 135–145)
TCO2: 20 mmol/L — ABNORMAL LOW (ref 22–32)

## 2022-04-05 LAB — COMPREHENSIVE METABOLIC PANEL
ALT: 30 U/L (ref 0–44)
AST: 34 U/L (ref 15–41)
Albumin: 4 g/dL (ref 3.5–5.0)
Alkaline Phosphatase: 101 U/L (ref 38–126)
Anion gap: 11 (ref 5–15)
BUN: 20 mg/dL (ref 6–20)
CO2: 18 mmol/L — ABNORMAL LOW (ref 22–32)
Calcium: 10 mg/dL (ref 8.9–10.3)
Chloride: 108 mmol/L (ref 98–111)
Creatinine, Ser: 1.43 mg/dL — ABNORMAL HIGH (ref 0.61–1.24)
GFR, Estimated: 58 mL/min — ABNORMAL LOW (ref >60)
Glucose, Bld: 173 mg/dL — ABNORMAL HIGH (ref 70–99)
Potassium: 4.4 mmol/L (ref 3.5–5.1)
Sodium: 137 mmol/L (ref 135–145)
Total Bilirubin: 0.7 mg/dL (ref 0.3–1.2)
Total Protein: 6.6 g/dL (ref 6.5–8.1)

## 2022-04-05 LAB — URINALYSIS, ROUTINE W REFLEX MICROSCOPIC
Bilirubin Urine: NEGATIVE
Glucose, UA: NEGATIVE mg/dL
Ketones, ur: NEGATIVE mg/dL
Leukocytes,Ua: NEGATIVE
Nitrite: NEGATIVE
Protein, ur: 100 mg/dL — AB
Specific Gravity, Urine: >1.046 — ABNORMAL HIGH (ref 1.005–1.030)
pH: 5 (ref 5.0–8.0)

## 2022-04-05 LAB — CBC
HCT: 44.6 % (ref 39.0–52.0)
HCT: 38.1 % — ABNORMAL LOW (ref 39.0–52.0)
Hemoglobin: 15.1 g/dL (ref 13.0–17.0)
Hemoglobin: 12.6 g/dL — ABNORMAL LOW (ref 13.0–17.0)
MCH: 30.1 pg (ref 26.0–34.0)
MCH: 29 pg (ref 26.0–34.0)
MCHC: 33.9 g/dL (ref 30.0–36.0)
MCHC: 33.1 g/dL (ref 30.0–36.0)
MCV: 88.8 fL (ref 80.0–100.0)
MCV: 87.6 fL (ref 80.0–100.0)
Platelets: UNDETERMINED 10*3/uL (ref 150–400)
Platelets: UNDETERMINED 10*3/uL (ref 150–400)
RBC: 5.02 MIL/uL (ref 4.22–5.81)
RBC: 4.35 MIL/uL (ref 4.22–5.81)
RDW: 13 % (ref 11.5–15.5)
RDW: 13 % (ref 11.5–15.5)
WBC: 12.4 10*3/uL — ABNORMAL HIGH (ref 4.0–10.5)
WBC: 14.6 10*3/uL — ABNORMAL HIGH (ref 4.0–10.5)
nRBC: 0 % (ref 0.0–0.2)
nRBC: 0 % (ref 0.0–0.2)

## 2022-04-05 LAB — LACTIC ACID, PLASMA: Lactic Acid, Venous: 2.4 mmol/L (ref 0.5–1.9)

## 2022-04-05 LAB — ABO/RH: ABO/RH(D): O NEG

## 2022-04-05 LAB — PROTIME-INR
INR: 1 (ref 0.8–1.2)
Prothrombin Time: 12.7 seconds (ref 11.4–15.2)

## 2022-04-05 LAB — HIV ANTIBODY (ROUTINE TESTING W REFLEX): HIV Screen 4th Generation wRfx: NONREACTIVE

## 2022-04-05 LAB — PREPARE RBC (CROSSMATCH)

## 2022-04-05 LAB — SAMPLE TO BLOOD BANK

## 2022-04-05 LAB — ETHANOL: Alcohol, Ethyl (B): <10 mg/dL (ref <10)

## 2022-04-05 SURGERY — EXPLORATION, ARTERY, FEMORAL
Anesthesia: General | Site: Leg Upper | Laterality: Left

## 2022-04-05 MED ORDER — MORPHINE SULFATE (PF) 2 MG/ML IV SOLN
2.0000 mg | INTRAVENOUS | Status: DC | PRN
  Administered 2022-04-10 – 2022-04-13 (×7): 2 mg via INTRAVENOUS
  Filled 2022-04-05 (×9): qty 1

## 2022-04-05 MED ORDER — FENTANYL CITRATE PF 50 MCG/ML IJ SOSY
50.0000 ug | PREFILLED_SYRINGE | Freq: Once | INTRAMUSCULAR | Status: AC
  Administered 2022-04-05: 50 ug via INTRAVENOUS

## 2022-04-05 MED ORDER — LACTATED RINGERS IV SOLN
INTRAVENOUS | Status: DC
Start: 2022-04-05 — End: 2022-04-05

## 2022-04-05 MED ORDER — ACETAMINOPHEN 10 MG/ML IV SOLN
1000.0000 mg | Freq: Once | INTRAVENOUS | Status: DC | PRN
  Administered 2022-04-05: 1000 mg via INTRAVENOUS

## 2022-04-05 MED ORDER — OXYCODONE HCL 5 MG PO TABS
10.0000 mg | ORAL_TABLET | ORAL | Status: DC | PRN
  Administered 2022-04-05: 10 mg via ORAL
  Administered 2022-04-06 – 2022-04-08 (×8): 15 mg via ORAL
  Administered 2022-04-08: 10 mg via ORAL
  Administered 2022-04-09 (×2): 15 mg via ORAL
  Administered 2022-04-09: 10 mg via ORAL
  Administered 2022-04-10 – 2022-04-13 (×11): 15 mg via ORAL
  Filled 2022-04-05 (×2): qty 3
  Filled 2022-04-05: qty 2
  Filled 2022-04-05 (×19): qty 3
  Filled 2022-04-05: qty 2

## 2022-04-05 MED ORDER — IOHEXOL 350 MG/ML SOLN
100.0000 mL | Freq: Once | INTRAVENOUS | Status: AC | PRN
  Administered 2022-04-05: 100 mL via INTRAVENOUS

## 2022-04-05 MED ORDER — ONDANSETRON 4 MG PO TBDP: 4.0000 mg | ORAL_TABLET | Freq: Four times a day (QID) | ORAL | Status: DC | PRN

## 2022-04-05 MED ORDER — PHENYLEPHRINE 80 MCG/ML (10ML) SYRINGE FOR IV PUSH (FOR BLOOD PRESSURE SUPPORT)
PREFILLED_SYRINGE | INTRAVENOUS | Status: DC | PRN
  Administered 2022-04-05: 120 ug via INTRAVENOUS

## 2022-04-05 MED ORDER — HYDROMORPHONE HCL 1 MG/ML IJ SOLN
INTRAMUSCULAR | Status: AC
  Filled 2022-04-05: qty 1

## 2022-04-05 MED ORDER — PHENYLEPHRINE HCL-NACL 20-0.9 MG/250ML-% IV SOLN
INTRAVENOUS | Status: DC | PRN
  Administered 2022-04-05: 25 ug/min via INTRAVENOUS
  Administered 2022-04-05: 50 ug/min via INTRAVENOUS

## 2022-04-05 MED ORDER — ACETAMINOPHEN 325 MG PO TABS: 325.0000 mg | ORAL_TABLET | Freq: Once | ORAL | Status: DC | PRN

## 2022-04-05 MED ORDER — PROPOFOL 10 MG/ML IV BOLUS
INTRAVENOUS | Status: DC | PRN
  Administered 2022-04-05: 150 mg via INTRAVENOUS

## 2022-04-05 MED ORDER — HYDROMORPHONE HCL 1 MG/ML IJ SOLN
0.2500 mg | INTRAMUSCULAR | Status: DC | PRN
  Administered 2022-04-05 (×4): 0.5 mg via INTRAVENOUS

## 2022-04-05 MED ORDER — ORAL CARE MOUTH RINSE: 15.0000 mL | Freq: Once | OROMUCOSAL | Status: DC

## 2022-04-05 MED ORDER — FENTANYL CITRATE (PF) 250 MCG/5ML IJ SOLN
INTRAMUSCULAR | Status: DC | PRN
  Administered 2022-04-05: 50 ug via INTRAVENOUS
  Administered 2022-04-05: 100 ug via INTRAVENOUS
  Administered 2022-04-05 (×3): 50 ug via INTRAVENOUS
  Administered 2022-04-05: 100 ug via INTRAVENOUS

## 2022-04-05 MED ORDER — METHOCARBAMOL 1000 MG/10ML IJ SOLN
500.0000 mg | Freq: Three times a day (TID) | INTRAVENOUS | Status: DC
  Administered 2022-04-05 – 2022-04-09 (×12): 500 mg via INTRAVENOUS
  Filled 2022-04-05: qty 5
  Filled 2022-04-05 (×3): qty 500
  Filled 2022-04-05 (×2): qty 5
  Filled 2022-04-05 (×2): qty 500
  Filled 2022-04-05 (×2): qty 5
  Filled 2022-04-05 (×3): qty 500
  Filled 2022-04-05: qty 5

## 2022-04-05 MED ORDER — MIDAZOLAM HCL 2 MG/2ML IJ SOLN
INTRAMUSCULAR | Status: AC
  Filled 2022-04-05: qty 2

## 2022-04-05 MED ORDER — SODIUM CHLORIDE 0.9 % IV SOLN: INTRAVENOUS | Status: DC

## 2022-04-05 MED ORDER — AMISULPRIDE (ANTIEMETIC) 5 MG/2ML IV SOLN: 10.0000 mg | Freq: Once | INTRAVENOUS | Status: DC | PRN

## 2022-04-05 MED ORDER — OXYCODONE HCL 5 MG PO TABS
ORAL_TABLET | ORAL | Status: AC
  Filled 2022-04-05: qty 2

## 2022-04-05 MED ORDER — TACROLIMUS 1 MG PO CAPS
1.0000 mg | ORAL_CAPSULE | Freq: Two times a day (BID) | ORAL | Status: DC
  Administered 2022-04-05 – 2022-04-06 (×2): 1 mg via ORAL
  Filled 2022-04-05 (×3): qty 1

## 2022-04-05 MED ORDER — SUGAMMADEX SODIUM 200 MG/2ML IV SOLN
INTRAVENOUS | Status: DC | PRN
  Administered 2022-04-05: 200 mg via INTRAVENOUS

## 2022-04-05 MED ORDER — MEPERIDINE HCL 25 MG/ML IJ SOLN: 6.2500 mg | INTRAMUSCULAR | Status: DC | PRN

## 2022-04-05 MED ORDER — ONDANSETRON HCL 4 MG/2ML IJ SOLN
INTRAMUSCULAR | Status: AC
  Administered 2022-04-05: 4 mg
  Filled 2022-04-05: qty 2

## 2022-04-05 MED ORDER — ACETAMINOPHEN 160 MG/5ML PO SOLN: 325.0000 mg | Freq: Once | ORAL | Status: DC | PRN

## 2022-04-05 MED ORDER — CHLORHEXIDINE GLUCONATE 0.12 % MT SOLN: 15.0000 mL | Freq: Once | OROMUCOSAL | Status: DC

## 2022-04-05 MED ORDER — LABETALOL HCL 100 MG PO TABS
200.0000 mg | ORAL_TABLET | Freq: Two times a day (BID) | ORAL | Status: DC
  Administered 2022-04-05 – 2022-04-13 (×16): 200 mg via ORAL
  Filled 2022-04-05 (×16): qty 2

## 2022-04-05 MED ORDER — PANTOPRAZOLE SODIUM 40 MG PO TBEC
40.0000 mg | DELAYED_RELEASE_TABLET | Freq: Every day | ORAL | Status: DC
  Administered 2022-04-06 – 2022-04-13 (×8): 40 mg via ORAL
  Filled 2022-04-05 (×8): qty 1

## 2022-04-05 MED ORDER — SUCCINYLCHOLINE CHLORIDE 200 MG/10ML IV SOSY
PREFILLED_SYRINGE | INTRAVENOUS | Status: DC | PRN
  Administered 2022-04-05: 60 mg via INTRAVENOUS
  Administered 2022-04-05: 120 mg via INTRAVENOUS

## 2022-04-05 MED ORDER — IODIXANOL 320 MG/ML IV SOLN
INTRAVENOUS | Status: DC | PRN
  Administered 2022-04-05: 35 mL

## 2022-04-05 MED ORDER — SODIUM CHLORIDE 0.9 % IV SOLN: INTRAVENOUS | Status: DC | PRN

## 2022-04-05 MED ORDER — SODIUM CHLORIDE 0.9% IV SOLUTION: Freq: Once | INTRAVENOUS | Status: DC

## 2022-04-05 MED ORDER — LACTATED RINGERS IV SOLN: INTRAVENOUS | Status: DC

## 2022-04-05 MED ORDER — HYDRALAZINE HCL 50 MG PO TABS
100.0000 mg | ORAL_TABLET | Freq: Three times a day (TID) | ORAL | Status: DC
Start: 2022-04-05 — End: 2022-04-13
  Administered 2022-04-05 – 2022-04-13 (×22): 100 mg via ORAL
  Filled 2022-04-05 (×23): qty 2

## 2022-04-05 MED ORDER — ONDANSETRON HCL 4 MG/2ML IJ SOLN: 4.0000 mg | Freq: Four times a day (QID) | INTRAMUSCULAR | Status: DC | PRN

## 2022-04-05 MED ORDER — CHLORHEXIDINE GLUCONATE 0.12 % MT SOLN
OROMUCOSAL | Status: AC
  Filled 2022-04-05: qty 15

## 2022-04-05 MED ORDER — HYDRALAZINE HCL 20 MG/ML IJ SOLN
10.0000 mg | Freq: Four times a day (QID) | INTRAMUSCULAR | Status: DC | PRN
  Administered 2022-04-05: 10 mg via INTRAVENOUS
  Filled 2022-04-05: qty 1

## 2022-04-05 MED ORDER — LIDOCAINE 2% (20 MG/ML) 5 ML SYRINGE
INTRAMUSCULAR | Status: DC | PRN
  Administered 2022-04-05: 40 mg via INTRAVENOUS

## 2022-04-05 MED ORDER — ROCURONIUM BROMIDE 10 MG/ML (PF) SYRINGE
PREFILLED_SYRINGE | INTRAVENOUS | Status: DC | PRN
  Administered 2022-04-05: 40 mg via INTRAVENOUS
  Administered 2022-04-05: 20 mg via INTRAVENOUS

## 2022-04-05 MED ORDER — HEPARIN 6000 UNIT IRRIGATION SOLUTION
Status: DC | PRN
  Administered 2022-04-05: 1

## 2022-04-05 MED ORDER — PANTOPRAZOLE SODIUM 40 MG IV SOLR
40.0000 mg | Freq: Every day | INTRAVENOUS | Status: DC
  Administered 2022-04-05: 40 mg via INTRAVENOUS
  Filled 2022-04-05 (×2): qty 10

## 2022-04-05 MED ORDER — ACETAMINOPHEN 10 MG/ML IV SOLN
INTRAVENOUS | Status: AC
  Filled 2022-04-05: qty 100

## 2022-04-05 MED ORDER — NIFEDIPINE ER OSMOTIC RELEASE 30 MG PO TB24
30.0000 mg | ORAL_TABLET | Freq: Every day | ORAL | Status: DC
  Administered 2022-04-06 – 2022-04-13 (×8): 30 mg via ORAL
  Filled 2022-04-05 (×8): qty 1

## 2022-04-05 MED ORDER — PROPOFOL 10 MG/ML IV BOLUS
INTRAVENOUS | Status: AC
  Filled 2022-04-05: qty 20

## 2022-04-05 MED ORDER — ONDANSETRON HCL 4 MG/2ML IJ SOLN
INTRAMUSCULAR | Status: DC | PRN
  Administered 2022-04-05: 4 mg via INTRAVENOUS

## 2022-04-05 MED ORDER — FENTANYL CITRATE (PF) 250 MCG/5ML IJ SOLN
INTRAMUSCULAR | Status: AC
  Filled 2022-04-05: qty 5

## 2022-04-05 MED ORDER — CEFAZOLIN SODIUM-DEXTROSE 2-4 GM/100ML-% IV SOLN
2.0000 g | Freq: Once | INTRAVENOUS | Status: AC
  Administered 2022-04-05: 2 g via INTRAVENOUS
  Filled 2022-04-05: qty 100

## 2022-04-05 MED ORDER — MIDAZOLAM HCL 2 MG/2ML IJ SOLN
INTRAMUSCULAR | Status: DC | PRN
  Administered 2022-04-05: 2 mg via INTRAVENOUS

## 2022-04-05 MED ORDER — HYDROMORPHONE HCL 1 MG/ML IJ SOLN
0.5000 mg | INTRAMUSCULAR | Status: DC | PRN
  Administered 2022-04-05 – 2022-04-06 (×3): 1 mg via INTRAVENOUS
  Administered 2022-04-06: 0.5 mg via INTRAVENOUS
  Administered 2022-04-06 – 2022-04-13 (×22): 1 mg via INTRAVENOUS
  Filled 2022-04-05 (×27): qty 1

## 2022-04-05 MED ORDER — ALPRAZOLAM 0.25 MG PO TABS
1.0000 mg | ORAL_TABLET | Freq: Every day | ORAL | Status: DC
Start: 2022-04-05 — End: 2022-04-13
  Administered 2022-04-05 – 2022-04-12 (×8): 1 mg via ORAL
  Filled 2022-04-05 (×9): qty 4

## 2022-04-05 MED ORDER — PROMETHAZINE HCL 25 MG/ML IJ SOLN: 6.2500 mg | INTRAMUSCULAR | Status: DC | PRN

## 2022-04-05 MED ORDER — 0.9 % SODIUM CHLORIDE (POUR BTL) OPTIME
TOPICAL | Status: DC | PRN
  Administered 2022-04-05: 2000 mL

## 2022-04-05 SURGICAL SUPPLY — 83 items
ADH SKN CLS APL DERMABOND .7 (GAUZE/BANDAGES/DRESSINGS) ×2
BAG COUNTER SPONGE SURGICOUNT (BAG) ×3 IMPLANT
BAG ISL DRAPE 18X18 STRL (DRAPES) ×4
BAG ISOLATION DRAPE 18X18 (DRAPES) IMPLANT
BAG SPNG CNTER NS LX DISP (BAG) ×2
BANDAGE ESMARK 6X9 LF (GAUZE/BANDAGES/DRESSINGS) IMPLANT
BNDG CMPR 9X6 STRL LF SNTH (GAUZE/BANDAGES/DRESSINGS)
BNDG ELASTIC 4X5.8 VLCR STR LF (GAUZE/BANDAGES/DRESSINGS) IMPLANT
BNDG ESMARK 6X9 LF (GAUZE/BANDAGES/DRESSINGS)
CANISTER SUCT 3000ML PPV (MISCELLANEOUS) ×3 IMPLANT
CANISTER WOUND CARE 500ML ATS (WOUND CARE) ×1 IMPLANT
CANNULA VESSEL 3MM 2 BLNT TIP (CANNULA) IMPLANT
CLIP VESOCCLUDE MED 24/CT (CLIP) ×3 IMPLANT
CLIP VESOCCLUDE SM WIDE 24/CT (CLIP) ×3 IMPLANT
COVER DOME SNAP 22 D (MISCELLANEOUS) ×1 IMPLANT
COVER SURGICAL LIGHT HANDLE (MISCELLANEOUS) ×1 IMPLANT
CUFF TOURN SGL QUICK 24 (TOURNIQUET CUFF)
CUFF TOURN SGL QUICK 34 (TOURNIQUET CUFF)
CUFF TOURN SGL QUICK 42 (TOURNIQUET CUFF) IMPLANT
CUFF TRNQT CYL 24X4X16.5-23 (TOURNIQUET CUFF) IMPLANT
CUFF TRNQT CYL 34X4.125X (TOURNIQUET CUFF) IMPLANT
DERMABOND ADVANCED (GAUZE/BANDAGES/DRESSINGS) ×1
DERMABOND ADVANCED .7 DNX12 (GAUZE/BANDAGES/DRESSINGS) IMPLANT
DRAIN CHANNEL 15F RND FF W/TCR (WOUND CARE) ×1 IMPLANT
DRAIN PENROSE 3/4X12 (DRAIN) IMPLANT
DRAPE ISOLATION BAG 18X18 (DRAPES) ×6
DRAPE X-RAY CASS 24X20 (DRAPES) IMPLANT
DRSG COVADERM 4X10 (GAUZE/BANDAGES/DRESSINGS) ×2 IMPLANT
DRSG TEGADERM 2-3/8X2-3/4 SM (GAUZE/BANDAGES/DRESSINGS) ×2 IMPLANT
DRSG TEGADERM 4X4.75 (GAUZE/BANDAGES/DRESSINGS) ×1 IMPLANT
DRSG VAC ATS LRG SENSATRAC (GAUZE/BANDAGES/DRESSINGS) ×1 IMPLANT
ELECT REM PT RETURN 9FT ADLT (ELECTROSURGICAL) ×3
ELECTRODE REM PT RTRN 9FT ADLT (ELECTROSURGICAL) ×2 IMPLANT
EVACUATOR SILICONE 100CC (DRAIN) ×1 IMPLANT
GAUZE SPONGE 2X2 8PLY STRL LF (GAUZE/BANDAGES/DRESSINGS) IMPLANT
GAUZE SPONGE 4X4 12PLY STRL (GAUZE/BANDAGES/DRESSINGS) ×1 IMPLANT
GAUZE XEROFORM 5X9 LF (GAUZE/BANDAGES/DRESSINGS) ×1 IMPLANT
GLOVE BIO SURGEON STRL SZ7.5 (GLOVE) ×3 IMPLANT
GLOVE BIOGEL PI IND STRL 8 (GLOVE) ×2 IMPLANT
GLOVE BIOGEL PI INDICATOR 8 (GLOVE) ×1
GLOVE SURG POLY ORTHO LF SZ7.5 (GLOVE) IMPLANT
GLOVE SURG UNDER LTX SZ8 (GLOVE) ×3 IMPLANT
GOWN STRL REUS W/ TWL LRG LVL3 (GOWN DISPOSABLE) ×6 IMPLANT
GOWN STRL REUS W/TWL LRG LVL3 (GOWN DISPOSABLE) ×9
KIT BASIN OR (CUSTOM PROCEDURE TRAY) ×3 IMPLANT
KIT TURNOVER KIT B (KITS) ×3 IMPLANT
MARKER GRAFT CORONARY BYPASS (MISCELLANEOUS) IMPLANT
NS IRRIG 1000ML POUR BTL (IV SOLUTION) ×6 IMPLANT
PACK PERIPHERAL VASCULAR (CUSTOM PROCEDURE TRAY) ×3 IMPLANT
PAD ARMBOARD 7.5X6 YLW CONV (MISCELLANEOUS) ×6 IMPLANT
SET COLLECT BLD 21X3/4 12 (NEEDLE) IMPLANT
SET MICROPUNCTURE 5F STIFF (MISCELLANEOUS) ×1 IMPLANT
SPONGE GAUZE 2X2 STER 10/PKG (GAUZE/BANDAGES/DRESSINGS) ×1
SPONGE SURGIFOAM ABS GEL 100 (HEMOSTASIS) IMPLANT
SPONGE T-LAP 18X18 ~~LOC~~+RFID (SPONGE) ×1 IMPLANT
STAPLER SKIN 35 WIDE (STAPLE) ×1 IMPLANT
STAPLER VISISTAT (STAPLE) IMPLANT
STAPLER VISISTAT 35W (STAPLE) ×1 IMPLANT
STOPCOCK 4 WAY LG BORE MALE ST (IV SETS) ×1 IMPLANT
SUT ETHIBOND 5 LR DA (SUTURE) IMPLANT
SUT ETHILON 2 0 FS 18 (SUTURE) ×1 IMPLANT
SUT MNCRL AB 4-0 PS2 18 (SUTURE) ×6 IMPLANT
SUT PROLENE 5 0 C 1 24 (SUTURE) ×3 IMPLANT
SUT PROLENE 6 0 BV (SUTURE) ×7 IMPLANT
SUT PROLENE 7 0 BV 1 (SUTURE) IMPLANT
SUT SILK 2 0 PERMA HAND 18 BK (SUTURE) ×3 IMPLANT
SUT SILK 3 0 (SUTURE)
SUT SILK 3-0 18XBRD TIE 12 (SUTURE) IMPLANT
SUT SILK 4 0 (SUTURE) ×3
SUT SILK 4-0 18XBRD TIE 12 (SUTURE) IMPLANT
SUT VIC AB 2-0 CT1 27 (SUTURE) ×3
SUT VIC AB 2-0 CT1 TAPERPNT 27 (SUTURE) IMPLANT
SUT VIC AB 2-0 CTB1 (SUTURE) ×6 IMPLANT
SUT VIC AB 3-0 SH 27 (SUTURE) ×9
SUT VIC AB 3-0 SH 27X BRD (SUTURE) ×4 IMPLANT
SYR 10ML LL (SYRINGE) ×2 IMPLANT
SYR 20CC LL (SYRINGE) ×1 IMPLANT
TOWEL GREEN STERILE (TOWEL DISPOSABLE) ×3 IMPLANT
TRAY FOLEY MTR SLVR 16FR STAT (SET/KITS/TRAYS/PACK) ×3 IMPLANT
TUBE CONNECTING 12X1/4 (SUCTIONS) ×1 IMPLANT
TUBING EXTENTION W/L.L. (IV SETS) ×1 IMPLANT
UNDERPAD 30X36 HEAVY ABSORB (UNDERPADS AND DIAPERS) ×3 IMPLANT
WATER STERILE IRR 1000ML POUR (IV SOLUTION) ×3 IMPLANT
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
## 2022-04-05 NOTE — Progress Notes (Signed)
Transition of Care [facility]) - CAGE-AID Screening   Patient Details  Name: Billy [name] MRN: [id number] Date of Birth: 1965/11/21   Hewitt Shorts, RN Trauma Response Nurse Phone Number: [phone] 04/05/2022, 1:21 PM  CAGE-AID Screening:               Substance Abuse Education Offered: No (pt denies any drug or etoh use)
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
## 2022-04-05 NOTE — Progress Notes (Signed)
Pt has questions regarding home medication regimen that he would like to discuss with the MD in the AM. Primary concerns are the immunosuppressants and some BP medications.    2337 RN discussed pt elevated BP with Dr. Janee Morn who verbally ordered Hydralazine 10mg  IV Q6 PRN
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
## 2022-04-05 NOTE — Anesthesia Procedure Notes (Signed)
Procedure Name: Intubation Date/Time: 04/05/2022 1:20 PM  Performed by: Eligha Bridegroom, CRNAPre-anesthesia Checklist: Patient identified, Emergency Drugs available, Suction available and Timeout performed Patient Re-evaluated:Patient Re-evaluated prior to induction Oxygen Delivery Method: Circle system utilized Preoxygenation: Pre-oxygenation with 100% oxygen Induction Type: IV induction, Rapid sequence and Cricoid Pressure applied Laryngoscope Size: Mac and 4 Grade View: Grade II Tube type: Oral Tube size: 8.0 mm Number of attempts: 1 Airway Equipment and Method: Stylet Placement Confirmation: ETT inserted through vocal cords under direct vision, positive ETCO2 and breath sounds checked- equal and bilateral Secured at: 22 cm Tube secured with: Tape Dental Injury: Teeth and Oropharynx as per pre-operative assessment

## 2022-04-05 NOTE — Transfer of Care (Signed)
Immediate Anesthesia Transfer of Care Note  Patient: Billy [name]  Procedure(s) Performed: FEMORAL ARTERY EXPLORATION LEFT POPITEAL ARTERY (Left: Leg Lower) INTRA OPERATIVE ARTERIOGRAM (Left: Leg Lower) Four Compartment Fascietomy (Left: Leg Upper)  Patient Location: PACU  Anesthesia Type:General  Level of Consciousness: awake and alert   Airway & Oxygen Therapy: Patient Spontanous Breathing  Post-op Assessment: Report given to RN and Post -op Vital signs reviewed and stable  Post vital signs: Reviewed and stable  Last Vitals:  Vitals Value Taken Time  BP 159/100 04/05/22 1615  Temp 36.6 C 04/05/22 1611  Pulse 94 04/05/22 1621  Resp 16 04/05/22 1621  SpO2 100 % 04/05/22 1621  Vitals shown include unvalidated device data.  Last Pain:  Vitals:   04/05/22 1611  TempSrc:   PainSc: 10-Worst pain ever         Complications: No notable events documented.

## 2022-04-05 NOTE — Anesthesia Preprocedure Evaluation (Addendum)
Anesthesia Evaluation  Patient identified by MRN, date of birth, ID band Patient awake    Reviewed: Allergy & Precautions, NPO status , Patient's Chart, lab work & pertinent test results  Airway Mallampati: II  TM Distance: >3 FB Neck ROM: Full    Dental  (+) Teeth Intact, Dental Advisory Given   Pulmonary neg pulmonary ROS,    breath sounds clear to auscultation       Cardiovascular hypertension,  Rhythm:Regular Rate:Normal     Neuro/Psych negative neurological ROS  negative psych ROS   GI/Hepatic negative GI ROS, Neg liver ROS,   Endo/Other  negative endocrine ROS  Renal/GU Renal Insufficiency     Musculoskeletal   Abdominal Normal abdominal exam  (+)   Peds  Hematology negative hematology ROS (+)   Anesthesia Other Findings   Reproductive/Obstetrics                            Anesthesia Physical Anesthesia Plan  ASA: 3 and emergent  Anesthesia Plan: General   Post-op Pain Management:    Induction: Intravenous  PONV Risk Score and Plan: 3 and Ondansetron, Dexamethasone and Midazolam  Airway Management Planned: Oral ETT  Additional Equipment: Arterial line  Intra-op Plan:   Post-operative Plan: Extubation in OR  Informed Consent: I have reviewed the patients History and Physical, chart, labs and discussed the procedure including the risks, benefits and alternatives for the proposed anesthesia with the patient or authorized representative who has indicated his/her understanding and acceptance.     Dental advisory given  Plan Discussed with: CRNA  Anesthesia Plan Comments:        Anesthesia Quick Evaluation

## 2022-04-05 NOTE — Anesthesia Procedure Notes (Signed)
Arterial Line Insertion Start/End7/12/2021 1:35 PM Performed by: Lonia Mad, CRNA, CRNA  Preanesthetic checklist: patient identified, IV checked, site marked, risks and benefits discussed, surgical consent, monitors and equipment checked, pre-op evaluation, timeout performed and anesthesia consent Right, radial was placed Hand hygiene performed  and maximum sterile barriers used   Procedure performed without using ultrasound guided technique. Following insertion, dressing applied and Biopatch. Post procedure assessment: normal  Patient tolerated the procedure well with no immediate complications.
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
## 2022-04-06 ENCOUNTER — Encounter (HOSPITAL_COMMUNITY): Payer: Self-pay | Admitting: Vascular Surgery

## 2022-04-06 LAB — CBC
HCT: 33.9 % — ABNORMAL LOW (ref 39.0–52.0)
Hemoglobin: 11 g/dL — ABNORMAL LOW (ref 13.0–17.0)
MCH: 28.8 pg (ref 26.0–34.0)
MCHC: 32.4 g/dL (ref 30.0–36.0)
MCV: 88.7 fL (ref 80.0–100.0)
Platelets: UNDETERMINED 10*3/uL (ref 150–400)
RBC: 3.82 MIL/uL — ABNORMAL LOW (ref 4.22–5.81)
RDW: 13.1 % (ref 11.5–15.5)
WBC: 10.1 10*3/uL (ref 4.0–10.5)
nRBC: 0 % (ref 0.0–0.2)

## 2022-04-06 LAB — TYPE AND SCREEN
ABO/RH(D): O NEG
Antibody Screen: NEGATIVE
Unit division: 0
Unit division: 0

## 2022-04-06 LAB — BPAM RBC
Blood Product Expiration Date: 202307052359
Blood Product Expiration Date: 202307062359
ISSUE DATE / TIME: 202306300927
Unit Type and Rh: 9500
Unit Type and Rh: 9500

## 2022-04-06 LAB — BASIC METABOLIC PANEL
Anion gap: 10 (ref 5–15)
BUN: 15 mg/dL (ref 6–20)
CO2: 20 mmol/L — ABNORMAL LOW (ref 22–32)
Calcium: 8.7 mg/dL — ABNORMAL LOW (ref 8.9–10.3)
Chloride: 105 mmol/L (ref 98–111)
Creatinine, Ser: 1.54 mg/dL — ABNORMAL HIGH (ref 0.61–1.24)
GFR, Estimated: 53 mL/min — ABNORMAL LOW (ref >60)
Glucose, Bld: 91 mg/dL (ref 70–99)
Potassium: 3.8 mmol/L (ref 3.5–5.1)
Sodium: 135 mmol/L (ref 135–145)

## 2022-04-06 MED ORDER — SULFAMETHOXAZOLE-TRIMETHOPRIM 400-80 MG PO TABS
1.0000 | ORAL_TABLET | ORAL | Status: DC
  Administered 2022-04-07 – 2022-04-12 (×3): 1 via ORAL
  Filled 2022-04-06 (×3): qty 1

## 2022-04-06 MED ORDER — PREDNISONE 5 MG PO TABS
5.0000 mg | ORAL_TABLET | Freq: Every morning | ORAL | Status: DC
  Administered 2022-04-06 – 2022-04-13 (×8): 5 mg via ORAL
  Filled 2022-04-06 (×8): qty 1

## 2022-04-06 MED ORDER — ENOXAPARIN SODIUM 40 MG/0.4ML IJ SOSY
40.0000 mg | PREFILLED_SYRINGE | INTRAMUSCULAR | Status: DC
  Administered 2022-04-06 – 2022-04-12 (×7): 40 mg via SUBCUTANEOUS
  Filled 2022-04-06 (×7): qty 0.4

## 2022-04-06 MED ORDER — TACROLIMUS 1 MG PO CAPS
1.5000 mg | ORAL_CAPSULE | Freq: Every day | ORAL | Status: DC
  Administered 2022-04-07 – 2022-04-13 (×7): 1.5 mg via ORAL
  Filled 2022-04-06 (×7): qty 1

## 2022-04-06 MED ORDER — MYCOPHENOLATE SODIUM 180 MG PO TBEC
720.0000 mg | DELAYED_RELEASE_TABLET | Freq: Two times a day (BID) | ORAL | Status: DC
  Administered 2022-04-06 – 2022-04-13 (×15): 720 mg via ORAL
  Filled 2022-04-06 (×15): qty 4

## 2022-04-06 MED ORDER — LABETALOL HCL 5 MG/ML IV SOLN
10.0000 mg | INTRAVENOUS | Status: DC | PRN
  Administered 2022-04-12: 10 mg via INTRAVENOUS
  Filled 2022-04-06: qty 4

## 2022-04-06 MED ORDER — HYDRALAZINE HCL 20 MG/ML IJ SOLN
10.0000 mg | Freq: Four times a day (QID) | INTRAMUSCULAR | Status: DC | PRN
  Administered 2022-04-10 – 2022-04-12 (×2): 10 mg via INTRAVENOUS
  Filled 2022-04-06 (×2): qty 1

## 2022-04-06 MED ORDER — TACROLIMUS 1 MG PO CAPS
1.0000 mg | ORAL_CAPSULE | Freq: Every day | ORAL | Status: DC
  Administered 2022-04-06 – 2022-04-12 (×7): 1 mg via ORAL
  Filled 2022-04-06 (×7): qty 1
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
## 2022-04-06 NOTE — Progress Notes (Signed)
Orthopedic Tech Progress Note Patient Details:  Billy [name] 07-09-1966 [id number]  Patient ate 100% and 360 ml in drinks. Got patient some more drink and notified RN as well.  Ortho Devices Type of Ortho Device: Prafo boot/shoe Ortho Device/Splint Location: LLE Ortho Device/Splint Interventions: Ordered, Application, Adjustment   Post Interventions Patient Tolerated: Well Instructions Provided: Care of device  Billy [name] 04/06/2022, 9:09 AM
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
## 2022-04-06 NOTE — Progress Notes (Signed)
PT Cancellation Note  Patient Details Name: Billy [name] MRN: [id number] DOB: 1966/02/08   Cancelled Treatment:    Reason Eval/Treat Not Completed: Active bedrest orders remain at this time. Pt with new orders as of 7AM 7/4 to remain flat in bed with LE elevated. Will plan to follow and evaluate as appropriate.   Vickki Muff, PT, DPT   Acute Rehabilitation Department   Ronnie Derby 04/06/2022, 4:13 PM

## 2022-04-07 LAB — CBC
HCT: 30.9 % — ABNORMAL LOW (ref 39.0–52.0)
Hemoglobin: 10.2 g/dL — ABNORMAL LOW (ref 13.0–17.0)
MCH: 29 pg (ref 26.0–34.0)
MCHC: 33 g/dL (ref 30.0–36.0)
MCV: 87.8 fL (ref 80.0–100.0)
Platelets: 134 10*3/uL — ABNORMAL LOW (ref 150–400)
RBC: 3.52 MIL/uL — ABNORMAL LOW (ref 4.22–5.81)
RDW: 13.1 % (ref 11.5–15.5)
WBC: 9.2 10*3/uL (ref 4.0–10.5)
nRBC: 0 % (ref 0.0–0.2)

## 2022-04-07 LAB — BASIC METABOLIC PANEL
Anion gap: 10 (ref 5–15)
BUN: 10 mg/dL (ref 6–20)
CO2: 23 mmol/L (ref 22–32)
Calcium: 8.9 mg/dL (ref 8.9–10.3)
Chloride: 106 mmol/L (ref 98–111)
Creatinine, Ser: 1.47 mg/dL — ABNORMAL HIGH (ref 0.61–1.24)
GFR, Estimated: 56 mL/min — ABNORMAL LOW (ref >60)
Glucose, Bld: 139 mg/dL — ABNORMAL HIGH (ref 70–99)
Potassium: 3.5 mmol/L (ref 3.5–5.1)
Sodium: 139 mmol/L (ref 135–145)

## 2022-04-07 MED ORDER — CHLORHEXIDINE GLUCONATE CLOTH 2 % EX PADS
6.0000 | MEDICATED_PAD | Freq: Every day | CUTANEOUS | Status: DC
  Administered 2022-04-07 – 2022-04-12 (×6): 6 via TOPICAL
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
## 2022-04-07 NOTE — Consult Note (Addendum)
WOC Nurse Consult Note: Patient receiving care in [facility] 4N08 Billy Rutter, PA-C present for dressing removal and placement. Reason for Consult: L medial lower leg fasciotomy wound vac changes on MWF schedule.   Wound type: LLE fasciotomy Pressure Injury POA: NA Measurement: 17 cm x 7.2 cm x 2 cm Wound bed: clean pink with swollen muscle in the center of the wound. (See photo taken by Josephine Igo today) Drainage (amount, consistency, odor) Sanguinous in canister Periwound: intact Dressing procedure/placement/frequency: One piece black foam removed. 1 piece Mepitel and 1 piece black foam placed into the wound. Drape applied, immediate suction obtained at 125 mmHg.   Thank you for the consult. WOC nurse will follow MWF for vac dressing change. Supplies ordered to be placed in the room.  May discharge with vac. HH and home vac will be needed.   Please re-consult the WOC team if needed.  Renaldo Reel Katrinka Blazing, MSN, RN, CMSRN, Angus Seller, [facility] Wound Treatment Associate Pager [phone]
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
## 2022-04-07 NOTE — Anesthesia Postprocedure Evaluation (Signed)
Anesthesia Post Note  Patient: Billy [name]  Procedure(s) Performed: FEMORAL ARTERY EXPLORATION LEFT POPITEAL ARTERY (Left: Leg Lower) INTRA OPERATIVE ARTERIOGRAM (Left: Leg Lower) Four Compartment Fascietomy (Left: Leg Upper)     Patient location during evaluation: PACU Anesthesia Type: General Level of consciousness: awake and alert Pain management: pain level controlled Vital Signs Assessment: post-procedure vital signs reviewed and stable Respiratory status: spontaneous breathing, nonlabored ventilation, respiratory function stable and patient connected to nasal cannula oxygen Cardiovascular status: blood pressure returned to baseline and stable Postop Assessment: no apparent nausea or vomiting Anesthetic complications: no   No notable events documented.  Last Vitals:  Vitals:   04/07/22 0316 04/07/22 0737  BP: (!) 161/89 (!) 145/86  Pulse: 97 100  Resp: 16 15  Temp: 37 C 36.8 C  SpO2: 95% 99%    Last Pain:  Vitals:   04/07/22 0737  TempSrc: Oral  PainSc:                  Kennieth Rad

## 2022-04-08 LAB — BASIC METABOLIC PANEL
Anion gap: 8 (ref 5–15)
BUN: 12 mg/dL (ref 6–20)
CO2: 24 mmol/L (ref 22–32)
Calcium: 9 mg/dL (ref 8.9–10.3)
Chloride: 107 mmol/L (ref 98–111)
Creatinine, Ser: 1.4 mg/dL — ABNORMAL HIGH (ref 0.61–1.24)
GFR, Estimated: 59 mL/min — ABNORMAL LOW (ref >60)
Glucose, Bld: 135 mg/dL — ABNORMAL HIGH (ref 70–99)
Potassium: 3.6 mmol/L (ref 3.5–5.1)
Sodium: 139 mmol/L (ref 135–145)

## 2022-04-08 LAB — CBC
HCT: 27.5 % — ABNORMAL LOW (ref 39.0–52.0)
Hemoglobin: 9.2 g/dL — ABNORMAL LOW (ref 13.0–17.0)
MCH: 29.5 pg (ref 26.0–34.0)
MCHC: 33.5 g/dL (ref 30.0–36.0)
MCV: 88.1 fL (ref 80.0–100.0)
Platelets: UNDETERMINED 10*3/uL (ref 150–400)
RBC: 3.12 MIL/uL — ABNORMAL LOW (ref 4.22–5.81)
RDW: 13 % (ref 11.5–15.5)
WBC: 7.4 10*3/uL (ref 4.0–10.5)
nRBC: 0 % (ref 0.0–0.2)
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
## 2022-04-08 NOTE — Care Management Important Message (Signed)
Important Message  Patient Details  Name: Billy [name] MRN: [id number] Date of Birth: 05/16/1966   Medicare Important Message Given:  Yes     Dorena Bodo 04/08/2022, 3:16 PM

## 2022-04-09 LAB — CBC
HCT: 27.1 % — ABNORMAL LOW (ref 39.0–52.0)
Hemoglobin: 8.9 g/dL — ABNORMAL LOW (ref 13.0–17.0)
MCH: 28.9 pg (ref 26.0–34.0)
MCHC: 32.8 g/dL (ref 30.0–36.0)
MCV: 88 fL (ref 80.0–100.0)
Platelets: 157 10*3/uL (ref 150–400)
RBC: 3.08 MIL/uL — ABNORMAL LOW (ref 4.22–5.81)
RDW: 12.8 % (ref 11.5–15.5)
WBC: 6.8 10*3/uL (ref 4.0–10.5)
nRBC: 0 % (ref 0.0–0.2)

## 2022-04-09 LAB — BASIC METABOLIC PANEL
Anion gap: 9 (ref 5–15)
BUN: 16 mg/dL (ref 6–20)
CO2: 25 mmol/L (ref 22–32)
Calcium: 9.2 mg/dL (ref 8.9–10.3)
Chloride: 107 mmol/L (ref 98–111)
Creatinine, Ser: 1.31 mg/dL — ABNORMAL HIGH (ref 0.61–1.24)
GFR, Estimated: >60 mL/min (ref >60)
Glucose, Bld: 157 mg/dL — ABNORMAL HIGH (ref 70–99)
Potassium: 3.6 mmol/L (ref 3.5–5.1)
Sodium: 141 mmol/L (ref 135–145)

## 2022-04-09 MED ORDER — METHOCARBAMOL 500 MG PO TABS
500.0000 mg | ORAL_TABLET | Freq: Three times a day (TID) | ORAL | Status: DC
  Administered 2022-04-09 – 2022-04-13 (×12): 500 mg via ORAL
  Filled 2022-04-09 (×12): qty 1
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
## 2022-04-09 NOTE — TOC Progression Note (Signed)
Transition of Care [facility]) - Progression Note    Patient Details  Name: Billy [name] MRN: [id number] Date of Birth: October 04, 1966  [facility] CM/SW Contact  Astrid Drafts Berna Spare, RN Phone Number: 04/09/2022, 4:06 PM  Clinical Narrative:    Updated [facility] and [facility] on patient progress.  Appears [facility] may plan to close wound early next week.  TOC Case Manager will continue to follow for home needs as patient progresses.    Expected Discharge Plan: Home w Home Health Services Barriers to Discharge: Continued Medical Work up  Expected Discharge Plan and Services Expected Discharge Plan: Home w Home Health Services   Discharge Planning Services: CM Consult Post Acute Care Choice: Home Health Living arrangements for the past 2 months: Single Family Home                                       Social Determinants of Health (SDOH) Interventions    Readmission Risk Interventions     No data to display         Quintella Baton, RN, BSN  Trauma/Neuro ICU Case Manager [phone]

## 2022-04-09 NOTE — Consult Note (Signed)
WOC Nurse wound follow up Patient receiving care in [facility] 4[address]08 Wound type: LLE fasciotomy Pressure Injury POA: NA Wound bed: clean pink with swollen muscle in the center of the wound. (See photo taken by Josephine Igo today) Drainage (amount, consistency, odor) Sanguinous in canister Periwound: intact Dressing procedure/placement/frequency: Vac dressing had been removed by Emilie Rutter, PA-C when I arrived to the room. W/D dressing removed. 1 piece mepitel and 1 piece black foam placed over the wound. Drape applied, immediate suction obtained at 125 mmHg.    Thank you for the consult. WOC nurse will follow MWF for vac dressing change.  1 med black foam and 1 mepitel in the room. Will need to order more if not discharged.    Please re-consult the WOC team if needed.   Renaldo Reel Katrinka Blazing, MSN, RN, CMSRN, Angus Seller, [facility] Wound Treatment Associate Pager [phone]

## 2022-04-09 NOTE — Progress Notes (Signed)
OT Cancellation Note  Patient Details Name: Billy [name] MRN: [id number] DOB: 1966-05-15   Cancelled Treatment:    Reason Eval/Treat Not Completed: Pain and fatigue limiting ability to participate: Pt very pleasantly declined Ruthann Cancer his morning due to just having returned to bed and having a dressing change with subsequent pain. Pt did demonstrate Independence with heel cord stretch using his gait belt and was able to verbalize correct timing for wear of his PRAFO. Will continue efforts.   Theodoro Clock 04/09/2022, 12:51 PM
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
## 2022-04-10 LAB — CBC
HCT: 28.3 % — ABNORMAL LOW (ref 39.0–52.0)
Hemoglobin: 9.4 g/dL — ABNORMAL LOW (ref 13.0–17.0)
MCH: 28.9 pg (ref 26.0–34.0)
MCHC: 33.2 g/dL (ref 30.0–36.0)
MCV: 87.1 fL (ref 80.0–100.0)
Platelets: 160 10*3/uL (ref 150–400)
RBC: 3.25 MIL/uL — ABNORMAL LOW (ref 4.22–5.81)
RDW: 12.9 % (ref 11.5–15.5)
WBC: 7.1 10*3/uL (ref 4.0–10.5)
nRBC: 0.4 % — ABNORMAL HIGH (ref 0.0–0.2)

## 2022-04-10 LAB — BASIC METABOLIC PANEL
Anion gap: 9 (ref 5–15)
BUN: 19 mg/dL (ref 6–20)
CO2: 24 mmol/L (ref 22–32)
Calcium: 9.3 mg/dL (ref 8.9–10.3)
Chloride: 106 mmol/L (ref 98–111)
Creatinine, Ser: 1.37 mg/dL — ABNORMAL HIGH (ref 0.61–1.24)
GFR, Estimated: >60 mL/min (ref >60)
Glucose, Bld: 134 mg/dL — ABNORMAL HIGH (ref 70–99)
Potassium: 3.4 mmol/L — ABNORMAL LOW (ref 3.5–5.1)
Sodium: 139 mmol/L (ref 135–145)

## 2022-04-10 MED ORDER — POTASSIUM CHLORIDE CRYS ER 20 MEQ PO TBCR
40.0000 meq | EXTENDED_RELEASE_TABLET | Freq: Once | ORAL | Status: AC
  Administered 2022-04-10: 40 meq via ORAL
  Filled 2022-04-10: qty 2
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
## 2022-04-11 LAB — BASIC METABOLIC PANEL
Anion gap: 11 (ref 5–15)
BUN: 17 mg/dL (ref 6–20)
CO2: 22 mmol/L (ref 22–32)
Calcium: 9.3 mg/dL (ref 8.9–10.3)
Chloride: 104 mmol/L (ref 98–111)
Creatinine, Ser: 1.28 mg/dL — ABNORMAL HIGH (ref 0.61–1.24)
GFR, Estimated: >60 mL/min (ref >60)
Glucose, Bld: 130 mg/dL — ABNORMAL HIGH (ref 70–99)
Potassium: 3.6 mmol/L (ref 3.5–5.1)
Sodium: 137 mmol/L (ref 135–145)

## 2022-04-11 MED ORDER — SODIUM CHLORIDE 0.9 % IV SOLN
1.5000 g | INTRAVENOUS | Status: AC
  Administered 2022-04-12: 1.5 g via INTRAVENOUS
  Filled 2022-04-11: qty 1.5
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
## 2022-04-11 NOTE — Progress Notes (Signed)
     56 y.o. male is s/p repair of L popliteal artery and 4 compartment fasciotomy 6 Days Post-Op   NPO past MN for fasciotomy closures 04/12/22 by Dr. Chauncey Reading PA-C

## 2022-04-12 ENCOUNTER — Other Ambulatory Visit: Payer: Self-pay

## 2022-04-12 ENCOUNTER — Inpatient Hospital Stay (HOSPITAL_COMMUNITY): Payer: Medicare Other | Admitting: Anesthesiology

## 2022-04-12 ENCOUNTER — Encounter (HOSPITAL_COMMUNITY): Payer: Self-pay | Admitting: General Surgery

## 2022-04-12 ENCOUNTER — Encounter (HOSPITAL_COMMUNITY): Admission: EM | Disposition: A | Discharge status: Home Health | Payer: Self-pay | Source: Home / Self Care

## 2022-04-12 DIAGNOSIS — Z8673 Personal history of transient ischemic attack (TIA), and cerebral infarction without residual deficits: Secondary | ICD-10-CM | POA: Diagnosis not present

## 2022-04-12 DIAGNOSIS — N289 Disorder of kidney and ureter, unspecified: Secondary | ICD-10-CM | POA: Diagnosis not present

## 2022-04-12 DIAGNOSIS — I70222 Atherosclerosis of native arteries of extremities with rest pain, left leg: Secondary | ICD-10-CM | POA: Diagnosis not present

## 2022-04-12 DIAGNOSIS — W3409XA Accidental discharge from other specified firearms, initial encounter: Secondary | ICD-10-CM | POA: Diagnosis not present

## 2022-04-12 DIAGNOSIS — I10 Essential (primary) hypertension: Secondary | ICD-10-CM

## 2022-04-12 DIAGNOSIS — S85092A Other specified injury of popliteal artery, left leg, initial encounter: Secondary | ICD-10-CM | POA: Diagnosis not present

## 2022-04-12 HISTORY — PX: FASCIOTOMY CLOSURE: SHX5829

## 2022-04-12 LAB — BASIC METABOLIC PANEL
Anion gap: 11 (ref 5–15)
BUN: 17 mg/dL (ref 6–20)
CO2: 24 mmol/L (ref 22–32)
Calcium: 9.5 mg/dL (ref 8.9–10.3)
Chloride: 101 mmol/L (ref 98–111)
Creatinine, Ser: 1.14 mg/dL (ref 0.61–1.24)
GFR, Estimated: >60 mL/min (ref >60)
Glucose, Bld: 105 mg/dL — ABNORMAL HIGH (ref 70–99)
Potassium: 3.8 mmol/L (ref 3.5–5.1)
Sodium: 136 mmol/L (ref 135–145)

## 2022-04-12 SURGERY — FASCIOTOMY CLOSURE
Anesthesia: General | Site: Leg Lower | Laterality: Left

## 2022-04-12 MED ORDER — CHLORHEXIDINE GLUCONATE 0.12 % MT SOLN
15.0000 mL | Freq: Once | OROMUCOSAL | Status: AC
Start: 2022-04-12 — End: 2022-04-12
  Administered 2022-04-12: 15 mL via OROMUCOSAL
  Filled 2022-04-12: qty 15

## 2022-04-12 MED ORDER — ONDANSETRON HCL 4 MG/2ML IJ SOLN
INTRAMUSCULAR | Status: DC | PRN
  Administered 2022-04-12: 4 mg via INTRAVENOUS

## 2022-04-12 MED ORDER — PHENYLEPHRINE 80 MCG/ML (10ML) SYRINGE FOR IV PUSH (FOR BLOOD PRESSURE SUPPORT)
PREFILLED_SYRINGE | INTRAVENOUS | Status: DC | PRN
  Administered 2022-04-12 (×2): 80 ug via INTRAVENOUS
  Administered 2022-04-12: 160 ug via INTRAVENOUS

## 2022-04-12 MED ORDER — EPHEDRINE SULFATE-NACL 50-0.9 MG/10ML-% IV SOSY
PREFILLED_SYRINGE | INTRAVENOUS | Status: DC | PRN
  Administered 2022-04-12 (×2): 5 mg via INTRAVENOUS

## 2022-04-12 MED ORDER — FENTANYL CITRATE (PF) 250 MCG/5ML IJ SOLN
INTRAMUSCULAR | Status: DC | PRN
  Administered 2022-04-12 (×2): 50 ug via INTRAVENOUS

## 2022-04-12 MED ORDER — 0.9 % SODIUM CHLORIDE (POUR BTL) OPTIME
TOPICAL | Status: DC | PRN
  Administered 2022-04-12: 1000 mL

## 2022-04-12 MED ORDER — ORAL CARE MOUTH RINSE: 15.0000 mL | Freq: Once | OROMUCOSAL | Status: AC

## 2022-04-12 MED ORDER — LIDOCAINE 2% (20 MG/ML) 5 ML SYRINGE
INTRAMUSCULAR | Status: DC | PRN
  Administered 2022-04-12: 100 mg via INTRAVENOUS

## 2022-04-12 MED ORDER — MIDAZOLAM HCL 2 MG/2ML IJ SOLN
INTRAMUSCULAR | Status: AC
  Filled 2022-04-12: qty 2

## 2022-04-12 MED ORDER — PROPOFOL 10 MG/ML IV BOLUS
INTRAVENOUS | Status: AC
Start: 2022-04-12 — End: ?
  Filled 2022-04-12: qty 20

## 2022-04-12 MED ORDER — FENTANYL CITRATE (PF) 100 MCG/2ML IJ SOLN
INTRAMUSCULAR | Status: AC
  Filled 2022-04-12: qty 2

## 2022-04-12 MED ORDER — MIDAZOLAM HCL 2 MG/2ML IJ SOLN
INTRAMUSCULAR | Status: DC | PRN
  Administered 2022-04-12: 2 mg via INTRAVENOUS

## 2022-04-12 MED ORDER — LACTATED RINGERS IV SOLN: INTRAVENOUS | Status: DC

## 2022-04-12 MED ORDER — FENTANYL CITRATE (PF) 100 MCG/2ML IJ SOLN
25.0000 ug | INTRAMUSCULAR | Status: DC | PRN
  Administered 2022-04-12 (×2): 50 ug via INTRAVENOUS

## 2022-04-12 MED ORDER — DEXAMETHASONE SODIUM PHOSPHATE 10 MG/ML IJ SOLN
INTRAMUSCULAR | Status: DC | PRN
  Administered 2022-04-12: 10 mg via INTRAVENOUS

## 2022-04-12 MED ORDER — FENTANYL CITRATE (PF) 250 MCG/5ML IJ SOLN
INTRAMUSCULAR | Status: AC
  Filled 2022-04-12: qty 5

## 2022-04-12 MED ORDER — PROPOFOL 10 MG/ML IV BOLUS
INTRAVENOUS | Status: DC | PRN
  Administered 2022-04-12: 170 mg via INTRAVENOUS

## 2022-04-12 MED ORDER — ONDANSETRON HCL 4 MG/2ML IJ SOLN: 4.0000 mg | Freq: Once | INTRAMUSCULAR | Status: DC | PRN

## 2022-04-12 SURGICAL SUPPLY — 43 items
BAG ISL DRAPE 18X18 STRL (DRAPES) ×1
BAG ISOLATION DRAPE 18X18 (DRAPES) IMPLANT
BRUSH SCRUB EZ PLAIN DRY (MISCELLANEOUS) ×2 IMPLANT
CANISTER SUCT 3000ML PPV (MISCELLANEOUS) ×2 IMPLANT
CLIP TI MEDIUM 24 (CLIP) ×1 IMPLANT
CLIP TI WIDE RED SMALL 24 (CLIP) ×1 IMPLANT
DRAPE ISOLATION BAG 18X18 (DRAPES) ×2
DRSG PAD ABDOMINAL 8X10 ST (GAUZE/BANDAGES/DRESSINGS) ×2 IMPLANT
ELECT REM PT RETURN 9FT ADLT (ELECTROSURGICAL) ×2
ELECTRODE REM PT RTRN 9FT ADLT (ELECTROSURGICAL) ×1 IMPLANT
GAUZE SPONGE 4X4 12PLY STRL (GAUZE/BANDAGES/DRESSINGS) ×2 IMPLANT
GAUZE SPONGE 4X4 12PLY STRL LF (GAUZE/BANDAGES/DRESSINGS) ×1 IMPLANT
GAUZE XEROFORM 5X9 LF (GAUZE/BANDAGES/DRESSINGS) ×1 IMPLANT
GLOVE BIO SURGEON STRL SZ7.5 (GLOVE) ×2 IMPLANT
GLOVE BIOGEL PI IND STRL 8 (GLOVE) ×1 IMPLANT
GLOVE BIOGEL PI INDICATOR 8 (GLOVE) ×2
GLOVE SRG 8 PF TXTR STRL LF DI (GLOVE) ×1 IMPLANT
GLOVE SURG POLYISO LF SZ8 (GLOVE) IMPLANT
GLOVE SURG SS PI 7.0 STRL IVOR (GLOVE) ×2 IMPLANT
GLOVE SURG SS PI 8.0 STRL IVOR (GLOVE) ×1 IMPLANT
GLOVE SURG UNDER POLY LF SZ8 (GLOVE) ×2
GOWN STRL REUS W/ TWL LRG LVL3 (GOWN DISPOSABLE) ×3 IMPLANT
GOWN STRL REUS W/TWL 2XL LVL3 (GOWN DISPOSABLE) ×4 IMPLANT
GOWN STRL REUS W/TWL LRG LVL3 (GOWN DISPOSABLE) ×6
KIT BASIN OR (CUSTOM PROCEDURE TRAY) ×2 IMPLANT
KIT TURNOVER KIT B (KITS) ×2 IMPLANT
NS IRRIG 1000ML POUR BTL (IV SOLUTION) ×2 IMPLANT
PACK GENERAL/GYN (CUSTOM PROCEDURE TRAY) ×2 IMPLANT
PACK UNIVERSAL I (CUSTOM PROCEDURE TRAY) ×2 IMPLANT
PAD ARMBOARD 7.5X6 YLW CONV (MISCELLANEOUS) ×4 IMPLANT
STAPLER VISISTAT 35W (STAPLE) ×1 IMPLANT
SUT ETHILON 2 0 PSLX (SUTURE) ×3 IMPLANT
SUT MNCRL AB 4-0 PS2 18 (SUTURE) IMPLANT
SUT SILK 2 0 (SUTURE) ×2
SUT SILK 2-0 18XBRD TIE 12 (SUTURE) IMPLANT
SUT SILK 3 0 (SUTURE) ×2
SUT SILK 3-0 18XBRD TIE 12 (SUTURE) IMPLANT
SUT VIC AB 2-0 CTX 36 (SUTURE) IMPLANT
SUT VIC AB 3-0 SH 27 (SUTURE)
SUT VIC AB 3-0 SH 27X BRD (SUTURE) IMPLANT
TAPE CLOTH SURG 4X10 WHT LF (GAUZE/BANDAGES/DRESSINGS) ×1 IMPLANT
TOWEL GREEN STERILE (TOWEL DISPOSABLE) ×2 IMPLANT
WATER STERILE IRR 1000ML POUR (IV SOLUTION) ×2 IMPLANT

## 2022-04-12 NOTE — Plan of Care (Signed)

## 2022-04-12 NOTE — Progress Notes (Signed)
OT Cancellation Note  Patient Details Name: Billy [name] MRN: [id number] DOB: Jul 12, 1966   Cancelled Treatment:    Reason Eval/Treat Not Completed: Patient at procedure or test/ unavailable (Pt having surgery this am. Will follow up later date.)  [facility] 04/12/2022, 7:34 AM Luisa Dago, OT/L   Acute OT Clinical Specialist Acute Rehabilitation Services Pager [phone] Office [phone]
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
## 2022-04-12 NOTE — Anesthesia Procedure Notes (Signed)
Procedure Name: LMA Insertion Date/Time: 04/12/2022 9:04 AM  Performed by: Maxine Glenn, CRNAPre-anesthesia Checklist: Patient identified, Emergency Drugs available, Suction available and Patient being monitored Patient Re-evaluated:Patient Re-evaluated prior to induction Oxygen Delivery Method: Circle System Utilized Preoxygenation: Pre-oxygenation with 100% oxygen Induction Type: IV induction Ventilation: Mask ventilation without difficulty LMA: LMA inserted LMA Size: 4.0 Number of attempts: 1 Airway Equipment and Method: Bite block Placement Confirmation: positive ETCO2 Tube secured with: Tape Dental Injury: Teeth and Oropharynx as per pre-operative assessment
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
## 2022-04-12 NOTE — Progress Notes (Signed)
PT Cancellation Note  Patient Details Name: Billy [name] MRN: [id number] DOB: 1965-12-10   Cancelled Treatment:    Reason Eval/Treat Not Completed: Patient at procedure or test/unavailable (Pt in surgery. Will reattempt at later date.)   Bevelyn Buckles 04/12/2022, 9:14 AM Zaela Graley M,PT Acute Rehab Services [phone]

## 2022-04-12 NOTE — Plan of Care (Signed)
  Problem: Education: Goal: Knowledge of General Education information will improve Description: Including pain rating scale, medication(s)/side effects and non-pharmacologic comfort measures Outcome: Progressing   Problem: Clinical Measurements: Goal: Ability to maintain clinical measurements within normal limits will improve Outcome: Progressing   Problem: Coping: Goal: Level of anxiety will decrease Outcome: Progressing   Problem: Skin Integrity: Goal: Risk for impaired skin integrity will decrease Outcome: Progressing

## 2022-04-12 NOTE — Transfer of Care (Signed)
Immediate Anesthesia Transfer of Care Note  Patient: Billy [name]  Procedure(s) Performed: FASCIOTOMY CLOSURE LEFT LEG (Left: Leg Lower)  Patient Location: PACU  Anesthesia Type:General  Level of Consciousness: awake and drowsy  Airway & Oxygen Therapy: Patient Spontanous Breathing and Patient connected to face mask oxygen  Post-op Assessment: Report given to RN and Post -op Vital signs reviewed and stable  Post vital signs: Reviewed and stable  Last Vitals:  Vitals Value Taken Time  BP 125/74 04/12/22 0945  Temp 36.8 C 04/12/22 0945  Pulse 86 04/12/22 0947  Resp 11 04/12/22 0947  SpO2 98 % 04/12/22 0947  Vitals shown include unvalidated device data.  Last Pain:  Vitals:   04/12/22 0843  TempSrc:   PainSc: 7       Patients Stated Pain Goal: 3 (04/12/22 0555)  Complications: No notable events documented.

## 2022-04-12 NOTE — Anesthesia Preprocedure Evaluation (Signed)
Anesthesia Evaluation  Patient identified by MRN, date of birth, ID band Patient awake    Reviewed: Allergy & Precautions, NPO status , Patient's Chart, lab work & pertinent test results, reviewed documented beta blocker date and time   History of Anesthesia Complications Negative for: history of anesthetic complications  Airway Mallampati: III  TM Distance: >3 FB Neck ROM: Full    Dental  (+) Teeth Intact, Dental Advisory Given   Pulmonary neg pulmonary ROS,    Pulmonary exam normal breath sounds clear to auscultation       Cardiovascular hypertension, Pt. on medications and Pt. on home beta blockers Normal cardiovascular exam Rhythm:Regular Rate:Normal     Neuro/Psych CVA, No Residual Symptoms negative psych ROS   GI/Hepatic Neg liver ROS, GERD  Medicated,  Endo/Other  negative endocrine ROS  Renal/GU Renal InsufficiencyRenal diseaseS/p kidney transplant      Musculoskeletal Left leg fasciotomy   Abdominal   Peds  Hematology negative hematology ROS (+)   Anesthesia Other Findings Day of surgery medications reviewed with the patient.  Reproductive/Obstetrics                             Anesthesia Physical Anesthesia Plan  ASA: 3  Anesthesia Plan: General   Post-op Pain Management: Tylenol PO (pre-op)*   Induction: Intravenous  PONV Risk Score and Plan: 2 and Midazolam, Dexamethasone and Ondansetron  Airway Management Planned: LMA  Additional Equipment:   Intra-op Plan:   Post-operative Plan: Extubation in OR  Informed Consent: I have reviewed the patients History and Physical, chart, labs and discussed the procedure including the risks, benefits and alternatives for the proposed anesthesia with the patient or authorized representative who has indicated his/her understanding and acceptance.     Dental advisory given  Plan Discussed with: CRNA  Anesthesia Plan Comments:          Anesthesia Quick Evaluation
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
## 2022-04-12 NOTE — Progress Notes (Signed)
  Progress Note    04/12/2022 8:43 AM Day of Surgery  Subjective:  No complaints   Vitals:   04/12/22 0745 04/12/22 0820  BP:  (!) 209/115  Pulse:  84  Resp:  20  Temp: 98 F (36.7 C) 97.7 F (36.5 C)  SpO2:  96%   Physical Exam: Lungs:  non labored Incisions:  pictured bellow; edematous gastrocnemius muscle belly Extremities:  palpable L DP pulse Neurologic: A&O 7/7    CBC    Component Value Date/Time   WBC 7.1 04/10/2022 0242   RBC 3.25 (L) 04/10/2022 0242   HGB 9.4 (L) 04/10/2022 0242   HCT 28.3 (L) 04/10/2022 0242   PLT 160 04/10/2022 0242   MCV 87.1 04/10/2022 0242   MCH 28.9 04/10/2022 0242   MCHC 33.2 04/10/2022 0242   RDW 12.9 04/10/2022 0242    BMET    Component Value Date/Time   NA 136 04/12/2022 0356   K 3.8 04/12/2022 0356   CL 101 04/12/2022 0356   CO2 24 04/12/2022 0356   GLUCOSE 105 (H) 04/12/2022 0356   BUN 17 04/12/2022 0356   CREATININE 1.14 04/12/2022 0356   CALCIUM 9.5 04/12/2022 0356   GFRNONAA >60 04/12/2022 0356    INR    Component Value Date/Time   INR 1.0 04/05/2022 1150     Intake/Output Summary (Last 24 hours) at 04/12/2022 0843 Last data filed at 04/12/2022 9147 Gross per 24 hour  Intake 240 ml  Output 2055 ml  Net -1815 ml      Assessment/Plan:  56 y.o. male is s/p repair of L popliteal artery and 4 compartment fasciotomy Day of Surgery   L foot well perfused with palpable DP pulse Edema improved this morning Will attempt fasciotomy closure  If unsuccessful, will home VAC TOC consulted to arrange [facility] RN and home vac   Victorino Sparrow MD [facility] [phone] 04/12/2022 8:43 AM
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
## 2022-04-12 NOTE — Plan of Care (Signed)
  Problem: Education: Goal: Knowledge of General Education information will improve Description: Including pain rating scale, medication(s)/side effects and non-pharmacologic comfort measures 04/12/2022 0148 by Charmian Muff, RN Outcome: Progressing 04/12/2022 0055 by Charmian Muff, RN Outcome: Progressing   Problem: Health Behavior/Discharge Planning: Goal: Ability to manage health-related needs will improve 04/12/2022 0148 by Charmian Muff, RN Outcome: Progressing 04/12/2022 0055 by Charmian Muff, RN Outcome: Progressing   Problem: Clinical Measurements: Goal: Ability to maintain clinical measurements within normal limits will improve 04/12/2022 0148 by Charmian Muff, RN Outcome: Progressing 04/12/2022 0055 by Charmian Muff, RN Outcome: Progressing Goal: Will remain free from infection Outcome: Progressing

## 2022-04-12 NOTE — Progress Notes (Signed)
Trauma Event Note  Event Summary: Rounded on patient-- at time of rounding, patient sitting up in bed, resting comfortably. Patient denies any needs at this time. Rate pain 0/10 as he just received pain medication.   Last imported Vital Signs BP (!) 162/80 (BP Location: Left Arm)   Pulse 98   Temp 98.5 F (36.9 C) (Oral)   Resp 18   Ht 5' 9.5" (1.765 m)   Wt 98.9 kg   SpO2 94%   BMI 31.74 kg/m   Trending CBC Recent Labs    04/10/22 0242  WBC 7.1  HGB 9.4*  HCT 28.3*  PLT 160    Trending Coag's No results for input(s): "APTT", "INR" in the last 72 hours.  Trending BMET Recent Labs    04/10/22 0242 04/11/22 0226 04/12/22 0356  NA 139 137 136  K 3.4* 3.6 3.8  CL 106 104 101  CO2 24 22 24   BUN 19 17 17   CREATININE 1.37* 1.28* 1.14  GLUCOSE 134* 130* 105*      Billy [name]  Trauma Response RN  Please call TRN at [phone] for further assistance.

## 2022-04-13 ENCOUNTER — Other Ambulatory Visit (HOSPITAL_COMMUNITY): Payer: Self-pay

## 2022-04-13 ENCOUNTER — Encounter (HOSPITAL_COMMUNITY): Payer: Self-pay | Admitting: Vascular Surgery

## 2022-04-13 LAB — CREATININE, SERUM
Creatinine, Ser: 1.3 mg/dL — ABNORMAL HIGH (ref 0.61–1.24)
GFR, Estimated: >60 mL/min (ref >60)

## 2022-04-13 MED ORDER — METHOCARBAMOL 500 MG PO TABS
500.0000 mg | ORAL_TABLET | Freq: Three times a day (TID) | ORAL | 1 refills | Status: AC
  Filled 2022-04-13: qty 60, 20d supply, fill #0

## 2022-04-13 MED ORDER — OXYCODONE HCL 10 MG PO TABS
10.0000 mg | ORAL_TABLET | ORAL | 0 refills | Status: AC | PRN
  Filled 2022-04-13: qty 40, 5d supply, fill #0

## 2022-04-13 NOTE — Progress Notes (Signed)
  Progress Note    04/13/2022 7:45 AM 1 Day Post-Op  Subjective:  Says he is pain free currently. The most pain comes from his fractured femur.     Vitals:   04/13/22 0320 04/13/22 0631  BP: (!) 143/88 (!) 165/97  Pulse: 94   Resp: 18   Temp: 98.3 F (36.8 C)   SpO2: 96%     Physical Exam: Cardiac:  regular Lungs:  nonlabored Incisions:  medial thigh and calf incisions covered with staples, c/d/I. Gauze and ace wrap on lower leg incision for gentle compression. Extremities:  Left foot is warm and well perfused. Palpable L DP 2+ Abdomen:  soft, nontender  CBC    Component Value Date/Time   WBC 7.1 04/10/2022 0242   RBC 3.25 (L) 04/10/2022 0242   HGB 9.4 (L) 04/10/2022 0242   HCT 28.3 (L) 04/10/2022 0242   PLT 160 04/10/2022 0242   MCV 87.1 04/10/2022 0242   MCH 28.9 04/10/2022 0242   MCHC 33.2 04/10/2022 0242   RDW 12.9 04/10/2022 0242    BMET    Component Value Date/Time   NA 136 04/12/2022 0356   K 3.8 04/12/2022 0356   CL 101 04/12/2022 0356   CO2 24 04/12/2022 0356   GLUCOSE 105 (H) 04/12/2022 0356   BUN 17 04/12/2022 0356   CREATININE 1.30 (H) 04/13/2022 0651   CALCIUM 9.5 04/12/2022 0356   GFRNONAA >60 04/13/2022 0651    INR    Component Value Date/Time   INR 1.0 04/05/2022 1150     Intake/Output Summary (Last 24 hours) at 04/13/2022 0745 Last data filed at 04/13/2022 0646 Gross per 24 hour  Intake 2300 ml  Output 2130 ml  Net 170 ml     Assessment/Plan:  56 y.o. male is 1 day post op, s/p: medial calf fasciotomy closure. 8 days post op, s/p repair of L popliteal artery and 4 compartment fasciotomy   -Patient's pain is currently well controlled. Most pain comes from femur fracture -L leg fasciotomies are closed with staples, c/d/l. Gentle compression with ace wrap  -L calf is soft, nontender -Good movement of L foot. Palpable 2+ DP pulse in the left foot -Patient is clear for discharge from vascular standpoint. Will follow up in office  in 3-4 weeks for incision check and staple removal.   Vicente Serene, PA-C [facility] [phone] 04/13/2022 7:45 AM
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
## 2022-04-13 NOTE — Anesthesia Postprocedure Evaluation (Signed)
Anesthesia Post Note  Patient: Billy [name]  Procedure(s) Performed: FASCIOTOMY CLOSURE LEFT LEG (Left: Leg Lower)     Patient location during evaluation: PACU Anesthesia Type: General Level of consciousness: awake and alert Pain management: pain level controlled Vital Signs Assessment: post-procedure vital signs reviewed and stable Respiratory status: spontaneous breathing, nonlabored ventilation, respiratory function stable and patient connected to nasal cannula oxygen Cardiovascular status: blood pressure returned to baseline and stable Postop Assessment: no apparent nausea or vomiting Anesthetic complications: no   No notable events documented.  Last Vitals:  Vitals:   04/13/22 0826 04/13/22 0900  BP: 128/69   Pulse: 93   Resp: 13 (!) 24  Temp: 36.8 C   SpO2: 97%     Last Pain:  Vitals:   04/13/22 0826  TempSrc: Oral  PainSc:                  [name]

## 2022-04-20 ENCOUNTER — Telehealth: Payer: Self-pay | Admitting: Vascular Surgery

## 2022-04-20 NOTE — Telephone Encounter (Signed)
-----   Message from Reola Calkins, [place] sent at 04/16/2022  9:09 AM EDT -----  ----- Message ----- From: Gerri Lins, PA-C Sent: 04/13/2022   7:23 AM EDT To: [facility] Admin Pool; [facility] Charge Pool  S/p L popliteal artery repair + fasciotomy closure, 7/10  Follow up in 3-4 weeks for incision check and staple removal

## 2022-05-07 ENCOUNTER — Other Ambulatory Visit (HOSPITAL_COMMUNITY): Payer: Self-pay
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
## 2022-05-13 ENCOUNTER — Ambulatory Visit (INDEPENDENT_AMBULATORY_CARE_PROVIDER_SITE_OTHER): Payer: Medicare Other | Admitting: Physician Assistant

## 2022-05-13 ENCOUNTER — Encounter: Payer: Self-pay | Admitting: Physician Assistant

## 2022-05-13 VITALS — BP 135/90 | HR 91 | Temp 97.1°F | Resp 20 | Ht 69.5 in | Wt 212.9 lb

## 2022-05-13 DIAGNOSIS — W3400XA Accidental discharge from unspecified firearms or gun, initial encounter: Secondary | ICD-10-CM

## 2022-05-13 DIAGNOSIS — Z9889 Other specified postprocedural states: Secondary | ICD-10-CM

## 2022-05-13 DIAGNOSIS — M7989 Other specified soft tissue disorders: Secondary | ICD-10-CM

## 2022-05-25 ENCOUNTER — Ambulatory Visit: Payer: Medicare Other

## 2022-08-11 ENCOUNTER — Other Ambulatory Visit: Payer: Self-pay | Admitting: *Deleted

## 2022-08-11 DIAGNOSIS — W3400XA Accidental discharge from unspecified firearms or gun, initial encounter: Secondary | ICD-10-CM

## 2022-08-19 ENCOUNTER — Ambulatory Visit (HOSPITAL_COMMUNITY): Payer: Medicare Other

## 2022-08-19 ENCOUNTER — Encounter (HOSPITAL_COMMUNITY): Payer: Self-pay | Admitting: Internal Medicine

## 2022-08-19 ENCOUNTER — Ambulatory Visit: Payer: Medicare Other

## 2022-08-19 DIAGNOSIS — R609 Edema, unspecified: Secondary | ICD-10-CM
